# Patient Record
Sex: Male | Born: 1955
Health system: Southern US, Community
[De-identification: ages and names within clinical notes are randomized; demographics above are authoritative.]

## PROBLEM LIST (undated history)

## (undated) DIAGNOSIS — I209 Angina pectoris, unspecified: Secondary | ICD-10-CM

## (undated) DIAGNOSIS — G8929 Other chronic pain: Secondary | ICD-10-CM

## (undated) DIAGNOSIS — E669 Obesity, unspecified: Secondary | ICD-10-CM

## (undated) DIAGNOSIS — F1910 Other psychoactive substance abuse, uncomplicated: Secondary | ICD-10-CM

## (undated) DIAGNOSIS — T7840XA Allergy, unspecified, initial encounter: Secondary | ICD-10-CM

## (undated) DIAGNOSIS — M549 Dorsalgia, unspecified: Secondary | ICD-10-CM

## (undated) DIAGNOSIS — I10 Essential (primary) hypertension: Secondary | ICD-10-CM

## (undated) DIAGNOSIS — I2510 Atherosclerotic heart disease of native coronary artery without angina pectoris: Secondary | ICD-10-CM

## (undated) DIAGNOSIS — E785 Hyperlipidemia, unspecified: Secondary | ICD-10-CM

## (undated) DIAGNOSIS — K219 Gastro-esophageal reflux disease without esophagitis: Secondary | ICD-10-CM

## (undated) HISTORY — DX: Atherosclerotic heart disease of native coronary artery without angina pectoris: I25.10

## (undated) HISTORY — DX: Obesity, unspecified: E66.9

## (undated) HISTORY — DX: Angina pectoris, unspecified: I20.9

## (undated) HISTORY — DX: Essential (primary) hypertension: I10

## (undated) HISTORY — DX: Other chronic pain: G89.29

## (undated) HISTORY — DX: Other psychoactive substance abuse, uncomplicated: F19.10

## (undated) HISTORY — DX: Dorsalgia, unspecified: M54.9

## (undated) HISTORY — PX: BACK SURGERY: SHX140

## (undated) HISTORY — DX: Allergy, unspecified, initial encounter: T78.40XA

## (undated) HISTORY — DX: Gastro-esophageal reflux disease without esophagitis: K21.9

## (undated) HISTORY — DX: Hyperlipidemia, unspecified: E78.5

---

## 1995-09-17 HISTORY — PX: ESOPHAGEAL DILATION: SHX303

## 1998-02-01 ENCOUNTER — Encounter: Admission: RE | Admit: 1998-02-01 | Discharge: 1998-02-01 | Payer: Self-pay | Admitting: Family Medicine

## 1998-06-07 ENCOUNTER — Encounter: Admission: RE | Admit: 1998-06-07 | Discharge: 1998-06-07 | Payer: Self-pay | Admitting: Family Medicine

## 1998-10-17 ENCOUNTER — Encounter: Admission: RE | Admit: 1998-10-17 | Discharge: 1998-10-17 | Payer: Self-pay | Admitting: Family Medicine

## 1999-03-27 ENCOUNTER — Encounter: Admission: RE | Admit: 1999-03-27 | Discharge: 1999-03-27 | Payer: Self-pay | Admitting: Sports Medicine

## 1999-05-30 ENCOUNTER — Encounter: Admission: RE | Admit: 1999-05-30 | Discharge: 1999-05-30 | Payer: Self-pay | Admitting: Family Medicine

## 1999-09-06 ENCOUNTER — Encounter: Admission: RE | Admit: 1999-09-06 | Discharge: 1999-09-06 | Payer: Self-pay | Admitting: Sports Medicine

## 1999-09-24 ENCOUNTER — Encounter: Admission: RE | Admit: 1999-09-24 | Discharge: 1999-09-24 | Payer: Self-pay | Admitting: Family Medicine

## 1999-12-03 ENCOUNTER — Encounter: Admission: RE | Admit: 1999-12-03 | Discharge: 1999-12-03 | Payer: Self-pay | Admitting: Sports Medicine

## 1999-12-06 ENCOUNTER — Encounter: Admission: RE | Admit: 1999-12-06 | Discharge: 1999-12-06 | Payer: Self-pay | Admitting: Family Medicine

## 2000-01-07 ENCOUNTER — Encounter: Payer: Self-pay | Admitting: Gastroenterology

## 2000-01-07 ENCOUNTER — Ambulatory Visit (HOSPITAL_COMMUNITY): Admission: RE | Admit: 2000-01-07 | Discharge: 2000-01-07 | Payer: Self-pay | Admitting: Gastroenterology

## 2000-01-10 ENCOUNTER — Ambulatory Visit (HOSPITAL_COMMUNITY): Admission: RE | Admit: 2000-01-10 | Discharge: 2000-01-10 | Payer: Self-pay | Admitting: Family Medicine

## 2000-04-28 ENCOUNTER — Encounter: Admission: RE | Admit: 2000-04-28 | Discharge: 2000-04-28 | Payer: Self-pay | Admitting: Family Medicine

## 2000-06-30 ENCOUNTER — Encounter: Admission: RE | Admit: 2000-06-30 | Discharge: 2000-06-30 | Payer: Self-pay | Admitting: Family Medicine

## 2000-07-03 ENCOUNTER — Encounter: Admission: RE | Admit: 2000-07-03 | Discharge: 2000-07-03 | Payer: Self-pay | Admitting: Family Medicine

## 2000-07-18 ENCOUNTER — Encounter: Admission: RE | Admit: 2000-07-18 | Discharge: 2000-07-18 | Payer: Self-pay | Admitting: Family Medicine

## 2001-04-20 ENCOUNTER — Ambulatory Visit (HOSPITAL_COMMUNITY): Admission: RE | Admit: 2001-04-20 | Discharge: 2001-04-20 | Payer: Self-pay | Admitting: Family Medicine

## 2001-04-20 ENCOUNTER — Encounter: Admission: RE | Admit: 2001-04-20 | Discharge: 2001-04-20 | Payer: Self-pay | Admitting: Family Medicine

## 2001-05-13 ENCOUNTER — Encounter: Admission: RE | Admit: 2001-05-13 | Discharge: 2001-05-13 | Payer: Self-pay | Admitting: Family Medicine

## 2001-06-05 ENCOUNTER — Encounter: Admission: RE | Admit: 2001-06-05 | Discharge: 2001-06-05 | Payer: Self-pay | Admitting: Family Medicine

## 2001-09-18 ENCOUNTER — Encounter: Admission: RE | Admit: 2001-09-18 | Discharge: 2001-09-18 | Payer: Self-pay | Admitting: Family Medicine

## 2002-01-20 ENCOUNTER — Encounter: Admission: RE | Admit: 2002-01-20 | Discharge: 2002-01-20 | Payer: Self-pay | Admitting: Family Medicine

## 2002-01-25 ENCOUNTER — Encounter: Admission: RE | Admit: 2002-01-25 | Discharge: 2002-01-25 | Payer: Self-pay | Admitting: Family Medicine

## 2002-02-05 ENCOUNTER — Encounter: Admission: RE | Admit: 2002-02-05 | Discharge: 2002-02-05 | Payer: Self-pay | Admitting: Family Medicine

## 2002-02-16 ENCOUNTER — Ambulatory Visit (HOSPITAL_BASED_OUTPATIENT_CLINIC_OR_DEPARTMENT_OTHER): Admission: RE | Admit: 2002-02-16 | Discharge: 2002-02-16 | Payer: Self-pay | Admitting: General Surgery

## 2002-02-16 ENCOUNTER — Encounter (INDEPENDENT_AMBULATORY_CARE_PROVIDER_SITE_OTHER): Payer: Self-pay | Admitting: Specialist

## 2002-06-08 ENCOUNTER — Encounter: Admission: RE | Admit: 2002-06-08 | Discharge: 2002-06-08 | Payer: Self-pay | Admitting: Family Medicine

## 2002-10-18 ENCOUNTER — Encounter: Admission: RE | Admit: 2002-10-18 | Discharge: 2002-10-18 | Payer: Self-pay | Admitting: Family Medicine

## 2003-01-06 ENCOUNTER — Encounter: Admission: RE | Admit: 2003-01-06 | Discharge: 2003-01-06 | Payer: Self-pay | Admitting: Family Medicine

## 2003-07-05 ENCOUNTER — Encounter: Admission: RE | Admit: 2003-07-05 | Discharge: 2003-07-05 | Payer: Self-pay | Admitting: Family Medicine

## 2003-09-17 HISTORY — PX: CARDIOVASCULAR STRESS TEST: SHX262

## 2003-11-04 ENCOUNTER — Encounter: Admission: RE | Admit: 2003-11-04 | Discharge: 2003-11-04 | Payer: Self-pay | Admitting: Family Medicine

## 2004-02-15 ENCOUNTER — Ambulatory Visit (HOSPITAL_COMMUNITY): Admission: RE | Admit: 2004-02-15 | Discharge: 2004-02-15 | Payer: Self-pay | Admitting: Family Medicine

## 2004-02-15 ENCOUNTER — Encounter: Admission: RE | Admit: 2004-02-15 | Discharge: 2004-02-15 | Payer: Self-pay | Admitting: Family Medicine

## 2004-02-23 ENCOUNTER — Inpatient Hospital Stay (HOSPITAL_COMMUNITY): Admission: EM | Admit: 2004-02-23 | Discharge: 2004-02-24 | Payer: Self-pay | Admitting: Emergency Medicine

## 2004-03-16 ENCOUNTER — Encounter: Admission: RE | Admit: 2004-03-16 | Discharge: 2004-03-16 | Payer: Self-pay | Admitting: Family Medicine

## 2004-03-26 ENCOUNTER — Encounter: Admission: RE | Admit: 2004-03-26 | Discharge: 2004-03-26 | Payer: Self-pay | Admitting: Family Medicine

## 2004-04-23 ENCOUNTER — Encounter: Admission: RE | Admit: 2004-04-23 | Discharge: 2004-04-23 | Payer: Self-pay | Admitting: Family Medicine

## 2004-08-14 ENCOUNTER — Ambulatory Visit: Payer: Self-pay | Admitting: Sports Medicine

## 2004-09-16 DIAGNOSIS — I209 Angina pectoris, unspecified: Secondary | ICD-10-CM

## 2004-09-16 HISTORY — DX: Angina pectoris, unspecified: I20.9

## 2004-11-21 ENCOUNTER — Ambulatory Visit: Payer: Self-pay | Admitting: Family Medicine

## 2005-04-15 ENCOUNTER — Ambulatory Visit: Payer: Self-pay | Admitting: Family Medicine

## 2005-04-16 DIAGNOSIS — I2510 Atherosclerotic heart disease of native coronary artery without angina pectoris: Secondary | ICD-10-CM

## 2005-04-16 HISTORY — DX: Atherosclerotic heart disease of native coronary artery without angina pectoris: I25.10

## 2005-05-09 ENCOUNTER — Ambulatory Visit (HOSPITAL_COMMUNITY): Admission: RE | Admit: 2005-05-09 | Discharge: 2005-05-09 | Payer: Self-pay | Admitting: Interventional Cardiology

## 2005-08-06 ENCOUNTER — Ambulatory Visit: Payer: Self-pay | Admitting: Family Medicine

## 2005-12-10 ENCOUNTER — Ambulatory Visit: Payer: Self-pay | Admitting: Family Medicine

## 2006-03-14 ENCOUNTER — Ambulatory Visit: Payer: Self-pay | Admitting: Family Medicine

## 2006-10-24 ENCOUNTER — Ambulatory Visit: Payer: Self-pay | Admitting: Family Medicine

## 2006-11-13 DIAGNOSIS — I2510 Atherosclerotic heart disease of native coronary artery without angina pectoris: Secondary | ICD-10-CM | POA: Insufficient documentation

## 2006-11-13 DIAGNOSIS — I25118 Atherosclerotic heart disease of native coronary artery with other forms of angina pectoris: Secondary | ICD-10-CM | POA: Insufficient documentation

## 2006-11-13 DIAGNOSIS — F1010 Alcohol abuse, uncomplicated: Secondary | ICD-10-CM | POA: Insufficient documentation

## 2006-11-13 DIAGNOSIS — K219 Gastro-esophageal reflux disease without esophagitis: Secondary | ICD-10-CM | POA: Insufficient documentation

## 2006-11-13 DIAGNOSIS — F411 Generalized anxiety disorder: Secondary | ICD-10-CM

## 2006-12-09 ENCOUNTER — Telehealth: Payer: Self-pay | Admitting: *Deleted

## 2006-12-30 ENCOUNTER — Encounter: Payer: Self-pay | Admitting: *Deleted

## 2006-12-30 ENCOUNTER — Ambulatory Visit: Payer: Self-pay | Admitting: Family Medicine

## 2007-01-20 ENCOUNTER — Encounter: Payer: Self-pay | Admitting: Family Medicine

## 2007-02-26 ENCOUNTER — Encounter: Payer: Self-pay | Admitting: Family Medicine

## 2007-04-20 ENCOUNTER — Telehealth: Payer: Self-pay | Admitting: *Deleted

## 2007-04-21 ENCOUNTER — Ambulatory Visit: Payer: Self-pay | Admitting: Family Medicine

## 2007-04-23 ENCOUNTER — Ambulatory Visit: Payer: Self-pay | Admitting: Family Medicine

## 2007-05-12 ENCOUNTER — Telehealth: Payer: Self-pay | Admitting: *Deleted

## 2007-06-04 ENCOUNTER — Encounter: Payer: Self-pay | Admitting: Family Medicine

## 2007-07-10 ENCOUNTER — Telehealth (INDEPENDENT_AMBULATORY_CARE_PROVIDER_SITE_OTHER): Payer: Self-pay | Admitting: *Deleted

## 2007-07-15 ENCOUNTER — Ambulatory Visit: Payer: Self-pay | Admitting: Family Medicine

## 2007-07-15 DIAGNOSIS — E781 Pure hyperglyceridemia: Secondary | ICD-10-CM | POA: Insufficient documentation

## 2007-07-15 DIAGNOSIS — E785 Hyperlipidemia, unspecified: Secondary | ICD-10-CM

## 2007-07-23 ENCOUNTER — Ambulatory Visit: Payer: Self-pay | Admitting: Family Medicine

## 2007-07-23 ENCOUNTER — Encounter (INDEPENDENT_AMBULATORY_CARE_PROVIDER_SITE_OTHER): Payer: Self-pay | Admitting: Family Medicine

## 2007-07-23 LAB — CONVERTED CEMR LAB
ALT: 30 units/L (ref 0–53)
AST: 37 units/L (ref 0–37)
Albumin: 4.6 g/dL (ref 3.5–5.2)
BUN: 14 mg/dL (ref 6–23)
CO2: 24 meq/L (ref 19–32)
Calcium: 9.8 mg/dL (ref 8.4–10.5)
Chloride: 104 meq/L (ref 96–112)
Cholesterol: 167 mg/dL (ref 0–200)
Creatinine, Ser: 1.15 mg/dL (ref 0.40–1.50)
HCT: 44.2 % (ref 39.0–52.0)
HDL: 64 mg/dL (ref >39)
Hemoglobin: 14.3 g/dL (ref 13.0–17.0)
PSA: 0.42 ng/mL (ref 0.10–4.00)
Potassium: 5 meq/L (ref 3.5–5.3)
RBC: 4.16 M/uL — ABNORMAL LOW (ref 4.22–5.81)
RDW: 13.4 % (ref 11.5–14.0)
Total CHOL/HDL Ratio: 2.6
WBC: 3.4 10*3/uL — ABNORMAL LOW (ref 4.0–10.5)

## 2007-07-27 ENCOUNTER — Encounter (INDEPENDENT_AMBULATORY_CARE_PROVIDER_SITE_OTHER): Payer: Self-pay | Admitting: Family Medicine

## 2007-11-28 ENCOUNTER — Telehealth (INDEPENDENT_AMBULATORY_CARE_PROVIDER_SITE_OTHER): Payer: Self-pay | Admitting: *Deleted

## 2007-12-01 ENCOUNTER — Telehealth (INDEPENDENT_AMBULATORY_CARE_PROVIDER_SITE_OTHER): Payer: Self-pay | Admitting: *Deleted

## 2008-01-04 ENCOUNTER — Encounter (INDEPENDENT_AMBULATORY_CARE_PROVIDER_SITE_OTHER): Payer: Self-pay | Admitting: Family Medicine

## 2008-01-04 ENCOUNTER — Ambulatory Visit: Payer: Self-pay | Admitting: Sports Medicine

## 2008-01-04 DIAGNOSIS — M549 Dorsalgia, unspecified: Secondary | ICD-10-CM

## 2008-01-04 DIAGNOSIS — I10 Essential (primary) hypertension: Secondary | ICD-10-CM

## 2008-01-04 LAB — CONVERTED CEMR LAB
BUN: 13 mg/dL (ref 6–23)
CO2: 24 meq/L (ref 19–32)
Calcium: 9.4 mg/dL (ref 8.4–10.5)
Chloride: 102 meq/L (ref 96–112)
Creatinine, Ser: 1.01 mg/dL (ref 0.40–1.50)
HCT: 43.5 % (ref 39.0–52.0)
Hemoglobin: 14.3 g/dL (ref 13.0–17.0)
MCV: 104.6 fL — ABNORMAL HIGH (ref 78.0–100.0)
Testosterone: 418.43 ng/dL (ref 350–890)
Total Bilirubin: 0.5 mg/dL (ref 0.3–1.2)
WBC: 3.7 10*3/uL — ABNORMAL LOW (ref 4.0–10.5)

## 2008-01-07 ENCOUNTER — Encounter (INDEPENDENT_AMBULATORY_CARE_PROVIDER_SITE_OTHER): Payer: Self-pay | Admitting: Family Medicine

## 2008-02-29 ENCOUNTER — Ambulatory Visit: Payer: Self-pay | Admitting: Family Medicine

## 2008-03-14 ENCOUNTER — Telehealth: Payer: Self-pay | Admitting: *Deleted

## 2008-05-09 ENCOUNTER — Ambulatory Visit: Payer: Self-pay | Admitting: Sports Medicine

## 2008-06-07 ENCOUNTER — Encounter: Payer: Self-pay | Admitting: Family Medicine

## 2008-07-04 ENCOUNTER — Telehealth (INDEPENDENT_AMBULATORY_CARE_PROVIDER_SITE_OTHER): Payer: Self-pay | Admitting: *Deleted

## 2008-07-12 ENCOUNTER — Encounter: Payer: Self-pay | Admitting: Family Medicine

## 2008-07-20 ENCOUNTER — Telehealth: Payer: Self-pay | Admitting: *Deleted

## 2008-07-20 ENCOUNTER — Encounter: Payer: Self-pay | Admitting: *Deleted

## 2008-08-04 ENCOUNTER — Telehealth: Payer: Self-pay | Admitting: *Deleted

## 2008-08-23 ENCOUNTER — Encounter: Payer: Self-pay | Admitting: Family Medicine

## 2008-08-23 ENCOUNTER — Ambulatory Visit: Payer: Self-pay | Admitting: Family Medicine

## 2008-08-23 LAB — CONVERTED CEMR LAB
Cholesterol: 151 mg/dL (ref 0–200)
HDL: 58 mg/dL (ref >39)
Total CHOL/HDL Ratio: 2.6
Triglycerides: 290 mg/dL — ABNORMAL HIGH (ref <150)

## 2008-08-25 ENCOUNTER — Encounter: Payer: Self-pay | Admitting: Family Medicine

## 2008-08-25 ENCOUNTER — Encounter: Admission: RE | Admit: 2008-08-25 | Discharge: 2008-08-25 | Payer: Self-pay | Admitting: Family Medicine

## 2009-02-14 ENCOUNTER — Ambulatory Visit: Payer: Self-pay | Admitting: Family Medicine

## 2009-08-18 ENCOUNTER — Telehealth (INDEPENDENT_AMBULATORY_CARE_PROVIDER_SITE_OTHER): Payer: Self-pay | Admitting: *Deleted

## 2009-08-29 ENCOUNTER — Ambulatory Visit: Payer: Self-pay | Admitting: Family Medicine

## 2009-08-29 ENCOUNTER — Encounter: Payer: Self-pay | Admitting: Family Medicine

## 2009-08-29 ENCOUNTER — Ambulatory Visit (HOSPITAL_COMMUNITY): Admission: RE | Admit: 2009-08-29 | Discharge: 2009-08-29 | Payer: Self-pay | Admitting: Family Medicine

## 2009-08-31 LAB — CONVERTED CEMR LAB
ALT: 35 units/L (ref 0–53)
BUN: 15 mg/dL (ref 6–23)
CO2: 21 meq/L (ref 19–32)
Calcium: 9.4 mg/dL (ref 8.4–10.5)
Chloride: 103 meq/L (ref 96–112)
Cholesterol: 155 mg/dL (ref 0–200)
Creatinine, Ser: 0.88 mg/dL (ref 0.40–1.50)
Glucose, Bld: 93 mg/dL (ref 70–99)
HDL: 51 mg/dL (ref >39)
Total Bilirubin: 0.5 mg/dL (ref 0.3–1.2)
Total CHOL/HDL Ratio: 3
Triglycerides: 276 mg/dL — ABNORMAL HIGH (ref <150)

## 2009-09-05 ENCOUNTER — Encounter: Payer: Self-pay | Admitting: Family Medicine

## 2009-09-14 ENCOUNTER — Telehealth: Payer: Self-pay | Admitting: *Deleted

## 2009-09-16 HISTORY — PX: COLONOSCOPY W/ BIOPSIES: SHX1374

## 2009-09-18 ENCOUNTER — Ambulatory Visit: Payer: Self-pay | Admitting: Family Medicine

## 2009-09-18 DIAGNOSIS — E663 Overweight: Secondary | ICD-10-CM | POA: Insufficient documentation

## 2009-11-09 ENCOUNTER — Telehealth: Payer: Self-pay | Admitting: Family Medicine

## 2009-11-10 ENCOUNTER — Telehealth: Payer: Self-pay | Admitting: Family Medicine

## 2009-11-13 ENCOUNTER — Encounter (INDEPENDENT_AMBULATORY_CARE_PROVIDER_SITE_OTHER): Payer: Self-pay | Admitting: *Deleted

## 2009-11-27 ENCOUNTER — Encounter: Payer: Self-pay | Admitting: Family Medicine

## 2009-11-27 ENCOUNTER — Ambulatory Visit: Payer: Self-pay | Admitting: Family Medicine

## 2009-11-27 DIAGNOSIS — J309 Allergic rhinitis, unspecified: Secondary | ICD-10-CM

## 2009-12-15 ENCOUNTER — Encounter: Payer: Self-pay | Admitting: Family Medicine

## 2010-02-21 ENCOUNTER — Telehealth: Payer: Self-pay | Admitting: Family Medicine

## 2010-03-20 ENCOUNTER — Telehealth (INDEPENDENT_AMBULATORY_CARE_PROVIDER_SITE_OTHER): Payer: Self-pay | Admitting: *Deleted

## 2010-03-27 ENCOUNTER — Encounter: Payer: Self-pay | Admitting: *Deleted

## 2010-03-27 ENCOUNTER — Encounter: Payer: Self-pay | Admitting: Family Medicine

## 2010-03-27 ENCOUNTER — Ambulatory Visit: Payer: Self-pay | Admitting: Family Medicine

## 2010-03-27 LAB — CONVERTED CEMR LAB
ALT: 37 units/L (ref 0–53)
Alkaline Phosphatase: 58 units/L (ref 39–117)
CO2: 22 meq/L (ref 19–32)
Creatinine, Ser: 0.92 mg/dL (ref 0.40–1.50)
HCT: 42.1 % (ref 39.0–52.0)
MCHC: 33 g/dL (ref 30.0–36.0)
MCV: 107.9 fL — ABNORMAL HIGH (ref 78.0–100.0)
Platelets: 137 10*3/uL — ABNORMAL LOW (ref 150–400)
Total Bilirubin: 0.5 mg/dL (ref 0.3–1.2)
WBC: 3.8 10*3/uL — ABNORMAL LOW (ref 4.0–10.5)

## 2010-05-23 ENCOUNTER — Telehealth: Payer: Self-pay | Admitting: Family Medicine

## 2010-06-18 ENCOUNTER — Telehealth: Payer: Self-pay | Admitting: *Deleted

## 2010-07-25 ENCOUNTER — Telehealth: Payer: Self-pay | Admitting: Family Medicine

## 2010-08-31 ENCOUNTER — Telehealth: Payer: Self-pay | Admitting: Family Medicine

## 2010-09-26 ENCOUNTER — Telehealth: Payer: Self-pay | Admitting: Family Medicine

## 2010-10-08 ENCOUNTER — Ambulatory Visit: Admission: RE | Admit: 2010-10-08 | Discharge: 2010-10-08 | Payer: Self-pay | Source: Home / Self Care

## 2010-10-08 DIAGNOSIS — M6740 Ganglion, unspecified site: Secondary | ICD-10-CM | POA: Insufficient documentation

## 2010-10-08 DIAGNOSIS — D164 Benign neoplasm of bones of skull and face: Secondary | ICD-10-CM | POA: Insufficient documentation

## 2010-10-08 DIAGNOSIS — R040 Epistaxis: Secondary | ICD-10-CM | POA: Insufficient documentation

## 2010-10-18 NOTE — Assessment & Plan Note (Signed)
Summary: f/u med/tlb   Vital Signs:  Patient profile:   55 year old male Weight:      189 pounds BMI:     27.22 Temp:     97.5 degrees F oral Pulse rate:   55 / minute BP sitting:   132 / 91  (left arm)  Vitals Entered By: Jimmy Footman, CMA (March 27, 2010 8:39 AM) CC: f/u meds Is Patient Diabetic? No Pain Assessment Patient in pain? no        Primary Care Provider:  Renold Don MD  CC:  f/u meds.  History of Present Illness: Chronic back pain: Pain is localized to lower back, sharp, intermittent pain.  History of prior back surgeries as well as diskectomies.  Previously prescribed Tylenol scheduled 6 times a day for pain relief but did not take it as prescribed...typically takes 500mg  two times a day if at all.  States too many pills as prescibed.  He will take a vicodin sporadically for breakthrough pain.  Denies any weakness or paresthesia of lower extremities.    Eye:  States he has occasional eye pain, feels like something is in his eye when he rubs it.  Has had previous cataract surgery about 10 years ago, corneal abrasion with Christmas tree branch several years ago.  Describes pain as dull, intermittent.  Started several weeks ago, persistent but mild.  No redness, pain with eye movement, cellulitis around area.  Blurry vision which is chronic for almost a year.  HTN: No adverse effects from medication.  Not checking it regularly.  Was well controlled at last visit.  No dizziness, HA, CP, palpitations, or swelling.  HLD: Tolerating medication.  No adverse effects.  No muscle pain or abd pain.  Allergies: Controlled on Fexofenadine, states Zyrtec does not work for him.  If does not take his medication, c/o watery, itchy eyes and rhinorrhea.  GERD: Well controlled on omeprazole.      Habits & Providers  Alcohol-Tobacco-Diet     Tobacco Status: quit  Current Problems (verified): 1)  Allergic Rhinitis  (ICD-477.9) 2)  Overweight  (ICD-278.02) 3)  Essential  Hypertension, Benign  (ICD-401.1) 4)  Back Pain  (ICD-724.5) 5)  Hyperlipidemia Nec/nos  (ICD-272.4) 6)  Gastroesophageal Reflux, No Esophagitis  (ICD-530.81) 7)  Coronary, Arteriosclerosis  (ICD-414.00) 8)  Anxiety  (ICD-300.00) 9)  Alcohol Abuse, Unspecified  (ICD-305.00)  Current Medications (verified): 1)  Amitriptyline Hcl 50 Mg Tabs (Amitriptyline Hcl) .... Take 1 Tablet By Mouth At Bedtime 2)  Aspirin Ec 81 Mg Tbec (Aspirin) .... Take 1 Tablet By Mouth Once A Day 3)  Buspirone Hcl 30 Mg Tabs (Buspirone Hcl) .... Take 1 Tablet By Mouth Twice A Day 4)  Allegra 60 Mg Tabs (Fexofenadine Hcl) .Marland Kitchen.. 1 Tab By Mouth Daily As Needed For Allergies 5)  Imdur 60 Mg Tb24 (Isosorbide Mononitrate) .... Take 1 Tablet By Mouth Once A Day 6)  Omeprazole 20 Mg Cpdr (Omeprazole) .... One Tab By Mouth Qday 7)  Vytorin 10-20 Mg Tabs (Ezetimibe-Simvastatin) .... Take 1 Tablet By Mouth Once A Day 8)  Metoprolol Tartrate 100 Mg Tabs (Metoprolol Tartrate) .... One Tab By Mouth Bid 9)  Nitrostat 0.4 Mg Subl (Nitroglycerin) .Marland Kitchen.. 1 Tab Sl Every 5 Minutes Up To 3 Doses As Needed For Severe Chest Pain Then Call 911 10)  Flonase 50 Mcg/act Susp (Fluticasone Propionate) .Marland Kitchen.. 1 Spray Each Nostril Daily  Allergies (verified): No Known Drug Allergies  Past History:  Past medical, surgical, family and social  histories (including risk factors) reviewed, and no changes noted (except as noted below).  Past Medical History: Reviewed history from 11/27/2009 and no changes required. HTN Hyperlipidemia CAD - Cards Dr. Katrinka Blazing Allergic Rhinitis ETOH abuse Chronic back pain s/p surgery Dr. Opal Sidles 6/92, Dr. Herby Abraham in 1/89 Esophageal rings s/p dilatation 1997, 6/01 pilonidal cyst s/p I&D 10/01--referred for removal 5/03 Polypectomy, 2 benigh polyps 5/03 colonoscopy 7/07 needs repeat in 7/10 ([facility])  Past Surgical History: Back surgery 1989, 1992 Esophageal dilatation for rings '97, 2001 Cardiac Cath - diffuse  artery spasm, ramus intermedius and diagnoal disease - 05/23/2005 cataract surgery 2000  ETT 04/01 McDiarmid, Normal - 01/10/2000  Family History: Reviewed history from 11/13/2006 and no changes required. Father has had MI age 9, PVD., Mother w/CABG in 65s, HTN, DM and high cholesterol., Sister had MI at age 72.  Social History: Reviewed history from 07/15/2007 and no changes required. 92-year pack-hx.  Stopped.  Drinks about 5-6 beers/day.  Used to drink 12 a day including liquor. Unemployed.  Disabled from back injury. Lives with his wife.Smoking Status:  quit  Review of Systems       ROS:  no headaches, pre-syncopal or syncopal episodes, chest pain, palpitations, shortness of breath or dyspnea, abdominal pain, diarrhea or constipation, melena, hematochezia, lower extremity swelling.    Physical Exam  General:  Vital signs reviewed Well-developed, well-nourished patient in NAD.  Awake, cooperative.  Eyes:  no conjuctival redness noted.  EOMI, no pain with eye movement.  Somewhat decreased visual acuity. pupils equal, pupils round, and pupils reactive to light.  No pain on palpation of eye through closed eyelid, eye does not feel swollen or increased pressure Mouth:  oral mucosa moist Lungs:  Normal respiratory effort, chest expands symmetrically. Lungs are clear to auscultation, no crackles or wheezes. Heart:  Normal rate and regular rhythm. S1 and S2 normal without gallop, murmur, click, rub or other extra sounds. Abdomen:  soft/nontender/nondistended.  No organomegaly, bowel sounds present.  Msk:  Inspection- no obvious deformities ROM- Limited flexion (more flexibility issue than pain), nl extension, rotation, and lateral flexion Palpation- nontender along spinal column, no mass or deformity Extremities:  no lower extremity edema noted,  Neurologic:  No focal deficits   Impression & Recommendations:  Problem # 1:  BACK PAIN (ICD-724.5) Assessment Unchanged Patient continues  to c/o lower back pain.  Description as per HPI.  Previously attempted regularly scheduled Tylenol but he would rather take as needed medication instead of scheduled, states regularly scheduled Tylenol is too many pills.  History of alcoholism as well as ongoing EtOH abuse would make me hesitant to prescribe too much Tylenol.  Has attempted Ultram in past without relief, does not take currently.  States that Vicodin has worked in past, does not use everyday, states that 60 pills last him about 4-5 months.  Discussed this with him as well as physical therapy, which he states he's tried before.  Will prescribe Vicodin for now, if he starts needing increased doseing may need to switch to long-acting medication.  Does not take Flexeril, will remove from med list.   The following medications were removed from the medication list:    Ultram 50 Mg Tabs (Tramadol hcl) .Marland Kitchen... 1 tab by mouth every 8 hours as needed for breakthrough pain    Tylenol Extra Strength 500 Mg Tabs (Acetaminophen) .Marland Kitchen... 2 tabs by mouth every 8 hours daily    Flexeril 10 Mg Tabs (Cyclobenzaprine hcl) .Marland Kitchen... 1 tab by mouth at  bedtime as needed for muscle spasms His updated medication list for this problem includes:    Aspirin Ec 81 Mg Tbec (Aspirin) .Marland Kitchen... Take 1 tablet by mouth once a day    Hydrocodone-acetaminophen 5-325 Mg Tabs (Hydrocodone-acetaminophen) .Marland Kitchen... Take 1 tab every 8 hours as needed for pain  Orders: [facility]- Est Level  3 (16109)  Problem # 2:  BLURRED VISION (ICD-368.8)  In history of patient with previous cataract surgery, plan to refer to ophthamology.  No signs or symptoms of increased ocular pressure at this visit, no conjunctivitis or signs of infection.  Plan to refer sooner rather than wait.  Discussed this with patient who agrees with plan.  Gave explicit red flag symptoms and reasons to return to clinic immediately or go to ED if he has any signs of increasing ocular pressure.     Orders: Ophthalmology Referral  (Ophthalmology)  Problem # 3:  ALLERGIC RHINITIS (ICD-477.9) Assessment: Unchanged Continue Flonase and Fexofenadine.   His updated medication list for this problem includes:    Allegra 60 Mg Tabs (Fexofenadine hcl) .Marland Kitchen... 1 tab by mouth daily as needed for allergies    Flonase 50 Mcg/act Susp (Fluticasone propionate) .Marland Kitchen... 1 spray each nostril daily  Problem # 4:  ESSENTIAL HYPERTENSION, BENIGN (ICD-401.1) Assessment: Improved Tolerating medicine well.  Vital signs good today.  Continue same dose of Beta blocker.   His updated medication list for this problem includes:    Metoprolol Tartrate 100 Mg Tabs (Metoprolol tartrate) ..... One tab by mouth bid  Orders: Comp Met-[facility] (60454-09811) CBC-[facility] (91478) [facility]- Est Level  3 (29562)  Complete Medication List: 1)  Amitriptyline Hcl 50 Mg Tabs (Amitriptyline hcl) .... Take 1 tablet by mouth at bedtime 2)  Aspirin Ec 81 Mg Tbec (Aspirin) .... Take 1 tablet by mouth once a day 3)  Buspirone Hcl 30 Mg Tabs (Buspirone hcl) .... Take 1 tablet by mouth twice a day 4)  Allegra 60 Mg Tabs (Fexofenadine hcl) .Marland Kitchen.. 1 tab by mouth daily as needed for allergies 5)  Imdur 60 Mg Tb24 (Isosorbide mononitrate) .... Take 1 tablet by mouth once a day 6)  Omeprazole 20 Mg Cpdr (Omeprazole) .... One tab by mouth qday 7)  Vytorin 10-20 Mg Tabs (Ezetimibe-simvastatin) .... Take 1 tablet by mouth once a day 8)  Metoprolol Tartrate 100 Mg Tabs (Metoprolol tartrate) .... One tab by mouth bid 9)  Nitrostat 0.4 Mg Subl (Nitroglycerin) .Marland Kitchen.. 1 tab sl every 5 minutes up to 3 doses as needed for severe chest pain then call 911 10)  Flonase 50 Mcg/act Susp (Fluticasone propionate) .Marland Kitchen.. 1 spray each nostril daily 11)  Hydrocodone-acetaminophen 5-325 Mg Tabs (Hydrocodone-acetaminophen) .... Take 1 tab every 8 hours as needed for pain Prescriptions: HYDROCODONE-ACETAMINOPHEN 5-325 MG TABS (HYDROCODONE-ACETAMINOPHEN) Take 1 tab every 8 hours as needed for pain  #60 x 0    Entered and Authorized by:   Renold Don MD   Signed by:   Renold Don MD on 03/27/2010   Method used:   Print then Give to Patient   RxID:   [id number] HYDROCODONE-ACETAMINOPHEN 5-325 MG TABS (HYDROCODONE-ACETAMINOPHEN) Take 1 tab every 8 hours as needed for pain  #60 x 0   Entered and Authorized by:   Renold Don MD   Signed by:   Renold Don MD on 03/27/2010   Method used:   Print then Give to Patient   RxID:   [id number]

## 2010-10-18 NOTE — Letter (Signed)
Summary: Generic Letter  [facility]  [address]   [place], [place] [address]   Phone: [phone]  Fax: [phone]    11/13/2009  KONNAR BEN [address] [place], [place]  [address]  Dear Mr. Flannagan,    The Vicodan is not a pain medication for chronic use and Dr Burnadette Pop  will not be able to refill that at this time.  Dr Burnadette Pop would be happy to see you in the office for further evaluation. Please call the office at [phone] to schedule an appointment.        Sincerely,   Gladstone Pih

## 2010-10-18 NOTE — Progress Notes (Signed)
Summary: phn msg  Phone Note Call from Patient   Caller: Patient Summary of Call: Pt called concerning pain meds and was explained that a f/up appointment was needed before medication can be given.  Pt refused to make appointment stating he was told all he had to do was call and he could get his medication without being seen for 6 months.  Also said that if he did not get his pain meds by Monday that he would be over here at office by 9:00 and he would get his pain meds. Initial call taken by: Clydell Hakim,  November 10, 2009 2:28 PM  Follow-up for Phone Call        I will be happy to see the patient to evaluate and discuss need for further pain medication Follow-up by: Marisue Ivan  MD,  November 12, 2009 10:55 PM

## 2010-10-18 NOTE — Progress Notes (Signed)
Summary: Rx Req  Phone Note Refill Request Call back at Home Phone [phone] Message from:  Patient  Refills Requested: Medication #1:  IMDUR 60 MG TB24 Take 1 tablet by mouth once a day [address]  Initial call taken by: Clydell Hakim,  May 23, 2010 2:29 PM    Prescriptions: IMDUR 60 MG TB24 (ISOSORBIDE MONONITRATE) Take 1 tablet by mouth once a day  #90 Tablet x 0   Entered and Authorized by:   Antoine Primas DO   Signed by:   Antoine Primas DO on 05/23/2010   Method used:   Electronically to        [id number] (retail)       [address]       [place], [place]  [address]       Ph: [phone] or [phone]       Fax: [phone]   RxID:   [id number]

## 2010-10-18 NOTE — Assessment & Plan Note (Signed)
Summary: f/u chronic issues   Vital Signs:  Patient profile:   55 year old male Height:      70 inches Weight:      197.3 pounds BMI:     28.41 Temp:     98.4 degrees F oral Pulse rate:   63 / minute BP sitting:   124 / 82  (left arm) Cuff size:   regular  Vitals Entered By: Gladstone Pih (November 27, 2009 10:14 AM) CC: F/U med refill Is Patient Diabetic? No Pain Assessment Patient in pain? no        Primary Care Provider:  Marisue Ivan  MD  CC:  F/U med refill.  History of Present Illness: 55yo M here to f/u on med refills  Chronic back pain: Pain is localized to lower back, sharp, intermittent pain.  States that he has not taken the tylenol as prescribed...typically takes 500mg  two times a day.  He will take a vicodin sporadically for breakthrough pain.  Denies any weakness or paresthesia of lower extremities.    HTN: No adverse effects from medication.  Not checking it regularly.  Was well controlled at last visit.  No dizziness, HA, CP, palpitations, or swelling.  HLD: Tolerating medication.  No adverse effects.  No muscle pain or abd pain.  Allergies: Desires to be switched to Zyrtec.  States that Allegra not effective.  c/o watery, itchy eyes and rhinorrhea.  GERD: Well controlled on omeprazole.    Preventative: Scheduled for repeat colonoscopy per Dr. Matthias Hughs on April 1st.  History of benign polpys.  Last colonoscopy was 2007.    Habits & Providers  Alcohol-Tobacco-Diet     Tobacco Status: never  Current Medications (verified): 1)  Amitriptyline Hcl 50 Mg Tabs (Amitriptyline Hcl) .... Take 1 Tablet By Mouth At Bedtime 2)  Aspirin Ec 81 Mg Tbec (Aspirin) .... Take 1 Tablet By Mouth Once A Day 3)  Buspirone Hcl 30 Mg Tabs (Buspirone Hcl) .... Take 1 Tablet By Mouth Twice A Day 4)  Zyrtec Allergy 10 Mg Caps (Cetirizine Hcl) .Marland Kitchen.. 1 Tab By Mouth Daily 5)  Imdur 60 Mg Tb24 (Isosorbide Mononitrate) .... Take 1 Tablet By Mouth Once A Day 6)  Omeprazole 20 Mg  Cpdr (Omeprazole) .... One Tab By Mouth Qday 7)  Ultram 50 Mg Tabs (Tramadol Hcl) .Marland Kitchen.. 1 Tab By Mouth Every 8 Hours As Needed For Breakthrough Pain 8)  Vytorin 10-20 Mg Tabs (Ezetimibe-Simvastatin) .... Take 1 Tablet By Mouth Once A Day 9)  Metoprolol Tartrate 100 Mg Tabs (Metoprolol Tartrate) .... One Tab By Mouth Bid 10)  Nitrostat 0.4 Mg Subl (Nitroglycerin) .Marland Kitchen.. 1 Tab Sl Every 5 Minutes Up To 3 Doses As Needed For Severe Chest Pain Then Call 911 11)  Tylenol Extra Strength 500 Mg Tabs (Acetaminophen) .... 2 Tabs By Mouth Every 8 Hours Daily 12)  Flonase 50 Mcg/act Susp (Fluticasone Propionate) .Marland Kitchen.. 1 Spray Each Nostril Daily 13)  Flexeril 10 Mg Tabs (Cyclobenzaprine Hcl) .Marland Kitchen.. 1 Tab By Mouth At Bedtime As Needed For Muscle Spasms  Allergies (verified): No Known Drug Allergies  Past History:  Past Medical History: HTN Hyperlipidemia CAD - Cards Dr. Katrinka Blazing Allergic Rhinitis ETOH abuse Chronic back pain s/p surgery Dr. Opal Sidles 6/92, Dr. Herby Abraham in 1/89 Esophageal rings s/p dilatation 1997, 6/01 pilonidal cyst s/p I&D 10/01--referred for removal 5/03 Polypectomy, 2 benigh polyps 5/03 colonoscopy 7/07 needs repeat in 7/10 ([facility])  Review of Systems       Denies any weakness or paresthesia  of lower extremities.   No dizziness, HA, CP, palpitations, or swelling.   Physical Exam  General:  VS Reviewed. Obese, well appearing, NAD.  Eyes:  no injected conjunctiva Nose:  no drainage Neck:  supple, full ROM, no goiter or mass  Lungs:  Normal respiratory effort, chest expands symmetrically. Lungs are clear to auscultation, no crackles or wheezes. Heart:  Normal rate and regular rhythm. S1 and S2 normal without gallop, murmur, click, rub or other extra sounds. Msk:  Back exam: Inspection- no obvious deformities ROM- Limited flexion (more flexibility issue than pain), nl extension, rotation, and lateral flexion Palpation- nontender along spinal column, no mass or  deformity   Extremities:  no edema Neurologic:  no focal deficits   Impression & Recommendations:  Problem # 1:  BACK PAIN (ICD-724.5) Assessment Unchanged  Chronic condition s/p 2 surgeries. No signs of medication abuse. Plan to have him increased his Tylenol to 1000mg  three times a day and switch from Vicodin to Ultram given some symptoms that sounds more like neuropathic pain. He did not tolerate the gabapentin in the past. He desires no further w/u or therapy. Pain contract was obtained today.  He is to not receive more than #60 ultram a month...ok for monthly refills if Ultram works for him. Will f/u in June.  The following medications were removed from the medication list:    Flexeril 10 Mg Tabs (Cyclobenzaprine hcl) .Marland Kitchen... Take 1/2 -1 tablet by mouth three times a day His updated medication list for this problem includes:    Aspirin Ec 81 Mg Tbec (Aspirin) .Marland Kitchen... Take 1 tablet by mouth once a day    Ultram 50 Mg Tabs (Tramadol hcl) .Marland Kitchen... 1 tab by mouth every 8 hours as needed for breakthrough pain    Tylenol Extra Strength 500 Mg Tabs (Acetaminophen) .Marland Kitchen... 2 tabs by mouth every 8 hours daily    Flexeril 10 Mg Tabs (Cyclobenzaprine hcl) .Marland Kitchen... 1 tab by mouth at bedtime as needed for muscle spasms  Orders: [facility]- Est  Level 4 (8[address]56)  Problem # 2:  ESSENTIAL HYPERTENSION, BENIGN (ICD-401.1) Assessment: Unchanged  At goal (<140/90) No changes to regimen.    His updated medication list for this problem includes:    Metoprolol Tartrate 100 Mg Tabs (Metoprolol tartrate) ..... One tab by mouth bid  Orders: [facility]- Est  Level 4 ([address]08)  Problem # 3:  GASTROESOPHAGEAL REFLUX, NO ESOPHAGITIS (ICD-530.81) Assessment: Unchanged  Well controlled on current regimen. No changes.  His updated medication list for this problem includes:    Omeprazole 20 Mg Cpdr (Omeprazole) ..... One tab by mouth qday  Orders: [facility]- Est  Level 4 (65784)  Problem # 4:  ALLERGIC RHINITIS  (ICD-477.9) Assessment: Deteriorated  Symptoms not adequately controlled on current allegra. Plan to switch to zyrtec and start flonase.  His updated medication list for this problem includes:    Zyrtec Allergy 10 Mg Caps (Cetirizine hcl) .Marland Kitchen... 1 tab by mouth daily    Flonase 50 Mcg/act Susp (Fluticasone propionate) .Marland Kitchen... 1 spray each nostril daily  Orders: [facility]- Est  Level 4 (69629)  Problem # 5:  HYPERLIPIDEMIA NEC/NOS (ICD-272.4) Assessment: Unchanged  At goal at last check 08/2009. Consider cutting back on the lipid medication but given known hx of CAD, would ideally defer this to Dr. Katrinka Blazing.  His updated medication list for this problem includes:    Vytorin 10-20 Mg Tabs (Ezetimibe-simvastatin) .Marland Kitchen... Take 1 tablet by mouth once a day  Orders: [facility]- Est  Level 4 (  [address]94)  Problem # 6:  Preventive Health Care (ICD-V70.0) Assessment: Comment Only Scheduled for repeat colonoscopy per Dr. Matthias Hughs on April 1st.  History of benign polpys.  Last colonoscopy was 2007.  Complete Medication List: 1)  Amitriptyline Hcl 50 Mg Tabs (Amitriptyline hcl) .... Take 1 tablet by mouth at bedtime 2)  Aspirin Ec 81 Mg Tbec (Aspirin) .... Take 1 tablet by mouth once a day 3)  Buspirone Hcl 30 Mg Tabs (Buspirone hcl) .... Take 1 tablet by mouth twice a day 4)  Zyrtec Allergy 10 Mg Caps (Cetirizine hcl) .Marland Kitchen.. 1 tab by mouth daily 5)  Imdur 60 Mg Tb24 (Isosorbide mononitrate) .... Take 1 tablet by mouth once a day 6)  Omeprazole 20 Mg Cpdr (Omeprazole) .... One tab by mouth qday 7)  Ultram 50 Mg Tabs (Tramadol hcl) .Marland Kitchen.. 1 tab by mouth every 8 hours as needed for breakthrough pain 8)  Vytorin 10-20 Mg Tabs (Ezetimibe-simvastatin) .... Take 1 tablet by mouth once a day 9)  Metoprolol Tartrate 100 Mg Tabs (Metoprolol tartrate) .... One tab by mouth bid 10)  Nitrostat 0.4 Mg Subl (Nitroglycerin) .Marland Kitchen.. 1 tab sl every 5 minutes up to 3 doses as needed for severe chest pain then call 911 11)  Tylenol Extra  Strength 500 Mg Tabs (Acetaminophen) .... 2 tabs by mouth every 8 hours daily 12)  Flonase 50 Mcg/act Susp (Fluticasone propionate) .Marland Kitchen.. 1 spray each nostril daily 13)  Flexeril 10 Mg Tabs (Cyclobenzaprine hcl) .Marland Kitchen.. 1 tab by mouth at bedtime as needed for muscle spasms  Patient Instructions: 1)  Follow up in June 2011. 2)  We switched pain medication to Ultram. 3)  I switched you to Zyrtec and a nasal spray called flonase for your allergies. 4)  Call me if the ultram does not work as well as the vicodin.   Prescriptions: FLEXERIL 10 MG TABS (CYCLOBENZAPRINE HCL) 1 tab by mouth at bedtime as needed for muscle spasms  #30 x 2   Entered and Authorized by:   Marisue Ivan  MD   Signed by:   Marisue Ivan  MD on 11/27/2009   Method used:   Electronically to        [facility]  [address] # [id number]* (retail)       [address]       [place], [place]  [address]       Ph: [phone] or [phone]       Fax: [phone]   RxID:   [id number] ZYRTEC ALLERGY 10 MG CAPS (CETIRIZINE HCL) 1 tab by mouth daily  #90 x 1   Entered and Authorized by:   Marisue Ivan  MD   Signed by:   Marisue Ivan  MD on 11/27/2009   Method used:   Electronically to        [facility]  [address] [id number]* (retail)       [address]       [place], [place]  [address]       Ph: [phone] or [phone]       Fax: [phone]   RxID:   [id number] FLONASE 50 MCG/ACT SUSP (FLUTICASONE PROPIONATE) 1 spray each nostril daily  #1 x 5   Entered and Authorized by:   Marisue Ivan  MD   Signed by:   Marisue Ivan  MD on 11/27/2009   Method used:   Electronically to  [facility]  [address] [id number]* (retail)       [address]       [place], [place]  [address]       Ph: [phone] or [phone]       Fax: [phone]   RxID:   [id number] ULTRAM 50 MG TABS (TRAMADOL HCL) 1 tab by mouth every 8 hours as needed for  breakthrough pain  #60 x 0   Entered and Authorized by:   Marisue Ivan  MD   Signed by:   Marisue Ivan  MD on 11/27/2009   Method used:   Electronically to        [facility]  [address] [id number]* (retail)       [address]       [place], [place]  [address]       Ph: [phone] or [phone]       Fax: [phone]   RxID:   [id number]    Prevention & Chronic Care Immunizations   Influenza vaccine: Not documented   Influenza vaccine deferral: Deferred  (09/18/2009)    Tetanus booster: 10/17/2002: Done.   Tetanus booster due: 10/17/2012    Pneumococcal vaccine: Not documented  Colorectal Screening   Hemoccult: Not documented   Hemoccult due: Not Indicated    Colonoscopy: Done.  (03/16/2006)   Colonoscopy due: 03/16/2016  Other Screening   PSA: 0.43  (08/23/2008)   PSA action/deferral: Discussed-PSA declined  (09/18/2009)   PSA due due: 08/23/2009   Smoking status: never  (11/27/2009)  Lipids   Total Cholesterol: 155  (08/29/2009)   LDL: 49  (08/29/2009)   LDL Direct: Not documented   HDL: 51  (08/29/2009)   Triglycerides: 276  (08/29/2009)    SGOT (AST): 34  (08/29/2009)   SGPT (ALT): 35  (08/29/2009)   Alkaline phosphatase: 62  (08/29/2009)   Total bilirubin: 0.5  (08/29/2009)    Lipid flowsheet reviewed?: Yes   Progress toward LDL goal: At goal  Hypertension   Last Blood Pressure: 124 / 82  (11/27/2009)   Serum creatinine: 0.88  (08/29/2009)   Serum potassium 4.4  (08/29/2009)    Hypertension flowsheet reviewed?: Yes   Progress toward BP goal: At goal  Self-Management Support :   Personal Goals (by the next clinic visit) :      Personal blood pressure goal: 140/90  (08/29/2009)     Personal LDL goal: 100  (08/29/2009)    Patient will work on the following items until the next clinic visit to reach self-care goals:     Medications and monitoring: take my medicines every day, check my blood pressure, bring all of  my medications to every visit  (11/27/2009)     Eating: drink diet soda or water instead of juice or soda, eat more vegetables, use fresh or frozen vegetables, eat foods that are low in salt, eat baked foods instead of fried foods, eat fruit for snacks and desserts, limit or avoid alcohol  (11/27/2009)    Hypertension self-management support: BP self-monitoring log, Written self-care plan, Education handout  (11/27/2009)   Hypertension self-care plan printed.   Hypertension education handout printed    Lipid self-management support: Written self-care plan, Education handout  (09/18/2009)

## 2010-10-18 NOTE — Progress Notes (Signed)
Summary: refill  Phone Note Refill Request Call back at Home Phone [phone] Message from:  Patient  Refills Requested: Medication #1:  AMITRIPTYLINE HCL 50 MG TABS Take 1 tablet by mouth at bedtime Initial call taken by: De Nurse,  September 26, 2010 3:03 PM    Prescriptions: AMITRIPTYLINE HCL 50 MG TABS (AMITRIPTYLINE HCL) Take 1 tablet by mouth at bedtime  #30 Tablet x 4   Entered and Authorized by:   Renold Don MD   Signed by:   Renold Don MD on 09/27/2010   Method used:   Electronically to        [facility]  [address] [id number]* (retail)       [address]       [place], [place]  [address]       Ph: [phone] or [phone]       Fax: [phone]   RxID:   [id number]

## 2010-10-18 NOTE — Progress Notes (Signed)
Summary: Allegra 60mg  qd #30 x2  Phone Note Call from Patient Call back at Home Phone [phone]   Caller: Patient Summary of Call: pt states that insurance has stopped paying for Zyrtex and needs allegra called in to [facility] [address] Initial call taken by: De Nurse,  February 21, 2010 2:27 PM  Follow-up for Phone Call        request completed Follow-up by: Marisue Ivan  MD,  February 22, 2010 4:30 PM    New/Updated Medications: ALLEGRA 60 MG TABS (FEXOFENADINE HCL) 1 tab by mouth daily as needed for allergies Prescriptions: ALLEGRA 60 MG TABS (FEXOFENADINE HCL) 1 tab by mouth daily as needed for allergies  #30 x 2   Entered and Authorized by:   Marisue Ivan  MD   Signed by:   Marisue Ivan  MD on 02/22/2010   Method used:   Electronically to        [facility]  [address] [id number] (retail)       [address]       [place], [place]  [address]       Ph: [phone] or [phone]       Fax: [phone]   RxID:   [id number]

## 2010-10-18 NOTE — Assessment & Plan Note (Signed)
Summary: f/u CP, back pain, labs   Vital Signs:  Patient profile:   55 year old male Height:      70 inches Weight:      194.6 pounds BMI:     28.02 Temp:     98.3 degrees F oral Pulse rate:   59 / minute BP sitting:   122 / 80  (left arm) Cuff size:   regular  Vitals Entered By: Gladstone Pih (September 18, 2009 8:43 AM) CC: f/u chest pain, back pain, recent labs Is Patient Diabetic? No Pain Assessment Patient in pain? yes     Location: lower back Intensity: 5 Type: aching   Primary Care Provider:  Marisue Ivan  MD  CC:  f/u chest pain, back pain, and recent labs.  History of Present Illness: 55yo M here for f/u chest pain, back pain, and recent labs  Chest pain: No further symptoms.  He was unable to go see Dr. Verdis Prime b/c of financial issues.  States that he is taking all of his meds as prescribed and has not had to use the NTG.  Back pain: He was unable to tolerate the gabapentin.  It gave him tremors even with 1 tablet.  States that his back pain is localized to the lower back and is not radiating as much as before.  No numbness or weakness of the lower extremities.  No bowel or bladder incontinence.  He rarely has to take the vicodin.  States he still has 52 tablets left from the previous prescription.  Overweight: Acknowledges that he is overweight and needs to lose weight but does not have a regimen to implement.   Preventative: Declines the prostate exam and the flu vaccine.    Habits & Providers  Alcohol-Tobacco-Diet     Tobacco Status: never  Allergies: No Known Drug Allergies  Social History: Smoking Status:  never  Review of Systems       no chest pain No numbness or weakness of the lower extremities.  No bowel or bladder incontinence.  Physical Exam  General:  VS reviewed.  Overweight, pleasant white male, NAD Msk:  Back exam: Inspection- no obvious deformities ROM- Limited flexion (more flexibility issue than pain), nl extension,  rotation, and lateral flexion Palpation- nontender along spinal column, no mass or deformity  Neg one legged back extension Neg straight leg both sitting and lying Neurologic:  Lower extremity: 5/5 knee flexion and extension 5/5 dorsal ankle flexion and plantar flexion 2+ dtrs of patellar tendon sensation grossly intact nl gait   Impression & Recommendations:  Problem # 1:  BACK PAIN (ICD-724.5) Assessment Improved  Symptoms slightly improved.  No persistent radiculopathy.  Will stop the gabapentin b/c of intolerance.  Plan to schedule the tylenol 1000mg  three times a day.  Vicodin for breakthrough pain.  He declines PT.  Will f/u as needed.   His updated medication list for this problem includes:    Aspirin Ec 81 Mg Tbec (Aspirin) .Marland Kitchen... Take 1 tablet by mouth once a day    Flexeril 10 Mg Tabs (Cyclobenzaprine hcl) .Marland Kitchen... Take 1/2 -1 tablet by mouth three times a day    Vicodin 5-500 Mg Tabs (Hydrocodone-acetaminophen) .Marland Kitchen... Take 1-2 tablet by mouth every eight hours prn    Tylenol Extra Strength 500 Mg Tabs (Acetaminophen) .Marland Kitchen... 2 tabs by mouth every 8 hours daily  Orders: [facility]- Est  Level 4 ([address])  Problem # 2:  CHEST PAIN, INTERMITTENT (ICD-786.50) Assessment: Improved  No further symptoms  since last visit on 12/14.  Advised to see Dr. Katrinka Blazing asap when he financially is able otherwise, if symptoms return, he should go to the ER for evaluation.  Orders: [facility]- Est  Level 4 ([address])  Problem # 3:  OVERWEIGHT (ICD-278.02) Assessment: Unchanged  Discussed how his weight affects his other medical problems.  He is planning on implementing some weight loss strategies.  Orders: [facility]- Est  Level 4 (28413)  Problem # 4:  HYPERLIPIDEMIA NEC/NOS (ICD-272.4) Assessment: Unchanged  LDL at goal (<100) and HDL at goal (>40).  Reviewed recent lipid panel and CMET with patient.  Will f/u in 6 months.   His updated medication list for this problem includes:    Vytorin 10-20 Mg Tabs  (Ezetimibe-simvastatin) .Marland Kitchen... Take 1 tablet by mouth once a day  Orders: [facility]- Est  Level 4 ([address])  Problem # 5:  ESSENTIAL HYPERTENSION, BENIGN (ICD-401.1) Assessment: Unchanged  At goal (<140/90).  No changes to regimen.   His updated medication list for this problem includes:    Metoprolol Tartrate 100 Mg Tabs (Metoprolol tartrate) ..... One tab by mouth bid  Orders: [facility]- Est  Level 4 (02725)  Problem # 6:  Preventive Health Care (ICD-V70.0) Assessment: Comment Only Declined flu and prostate.  Up to date on colonoscopy (2007).  Discussed briefly possible signs of OSA.  He is going to ask his wife about his night awakenings.  If it becomes a problem and persist, he is going to contact me and we'll order a splint night study.  Complete Medication List: 1)  Amitriptyline Hcl 50 Mg Tabs (Amitriptyline hcl) .... Take 1 tablet by mouth at bedtime 2)  Aspirin Ec 81 Mg Tbec (Aspirin) .... Take 1 tablet by mouth once a day 3)  Buspirone Hcl 30 Mg Tabs (Buspirone hcl) .... Take 1 tablet by mouth twice a day 4)  Fexofenadine Hcl 180 Mg Tabs (Fexofenadine hcl) .... One tab by mouth qday 5)  Flexeril 10 Mg Tabs (Cyclobenzaprine hcl) .... Take 1/2 -1 tablet by mouth three times a day 6)  Imdur 60 Mg Tb24 (Isosorbide mononitrate) .... Take 1 tablet by mouth once a day 7)  Omeprazole 20 Mg Cpdr (Omeprazole) .... One tab by mouth qday 8)  Vicodin 5-500 Mg Tabs (Hydrocodone-acetaminophen) .... Take 1-2 tablet by mouth every eight hours prn 9)  Vytorin 10-20 Mg Tabs (Ezetimibe-simvastatin) .... Take 1 tablet by mouth once a day 10)  Metoprolol Tartrate 100 Mg Tabs (Metoprolol tartrate) .... One tab by mouth bid 11)  Nitrostat 0.4 Mg Subl (Nitroglycerin) .Marland Kitchen.. 1 tab sl every 5 minutes up to 3 doses as needed for severe chest pain then call 911 12)  Tylenol Extra Strength 500 Mg Tabs (Acetaminophen) .... 2 tabs by mouth every 8 hours daily  Patient Instructions: 1)  I'll see you every 6 months but  earlier if you have any issues or concerns. 2)  Schedule the tylenol as described below. 3)  Ask your wife about your sleep habits (are you gasping for air at night?)...if so and it's bothering you, call me and we'll get a sleep study.   Prevention & Chronic Care Immunizations   Influenza vaccine: Not documented   Influenza vaccine deferral: Deferred  (09/18/2009)    Tetanus booster: 10/17/2002: Done.   Tetanus booster due: 10/17/2012    Pneumococcal vaccine: Not documented  Colorectal Screening   Hemoccult: Not documented   Hemoccult due: Not Indicated    Colonoscopy: Done.  (03/16/2006)   Colonoscopy due: 03/16/2016  Other Screening   PSA: 0.43  (08/23/2008)   PSA action/deferral: Discussed-PSA declined  (09/18/2009)   PSA due due: 08/23/2009   Smoking status: never  (09/18/2009)  Lipids   Total Cholesterol: 155  (08/29/2009)   LDL: 49  (08/29/2009)   LDL Direct: Not documented   HDL: 51  (08/29/2009)   Triglycerides: 276  (08/29/2009)    SGOT (AST): 34  (08/29/2009)   SGPT (ALT): 35  (08/29/2009)   Alkaline phosphatase: 62  (08/29/2009)   Total bilirubin: 0.5  (08/29/2009)    Lipid flowsheet reviewed?: Yes   Progress toward LDL goal: At goal  Hypertension   Last Blood Pressure: 122 / 80  (09/18/2009)   Serum creatinine: 0.88  (08/29/2009)   Serum potassium 4.4  (08/29/2009)    Hypertension flowsheet reviewed?: Yes   Progress toward BP goal: At goal  Self-Management Support :   Personal Goals (by the next clinic visit) :      Personal blood pressure goal: 140/90  (08/29/2009)     Personal LDL goal: 100  (08/29/2009)    Patient will work on the following items until the next clinic visit to reach self-care goals:     Medications and monitoring: take my medicines every day, check my blood pressure, bring all of my medications to every visit, weigh myself weekly  (09/18/2009)     Eating: drink diet soda or water instead of juice or soda, eat more  vegetables, use fresh or frozen vegetables, eat foods that are low in salt, eat baked foods instead of fried foods, eat fruit for snacks and desserts, limit or avoid alcohol  (09/18/2009)    Hypertension self-management support: BP self-monitoring log, Written self-care plan, Education handout  (09/18/2009)   Hypertension self-care plan printed.   Hypertension education handout printed    Lipid self-management support: Written self-care plan, Education handout  (09/18/2009)   Lipid self-care plan printed.   Lipid education handout printed

## 2010-10-18 NOTE — Miscellaneous (Signed)
Summary: Controlled Substances Contract  Controlled Substances Contract   Imported By: Bradly Bienenstock 11/27/2009 16:18:34  _____________________________________________________________________  External Attachment:    Type:   Image     Comment:   External Document

## 2010-10-18 NOTE — Progress Notes (Signed)
Summary: Rx Prob  Phone Note Call from Patient Call back at Home Phone [phone]   Caller: Patient Summary of Call: Checking on status of refills sent in last week.  Pharmacy says that they were denied. Initial call taken by: Clydell Hakim,  March 20, 2010 2:29 PM  Follow-up for Phone Call        patient notified that rx have been sent in . Follow-up by: Theresia Lo RN,  March 20, 2010 3:35 PM

## 2010-10-18 NOTE — Progress Notes (Signed)
Summary: Rx Req  Phone Note Refill Request Call back at Home Phone [phone] Message from:  Patient  Refills Requested: Medication #1:  METOPROLOL TARTRATE 100 MG TABS One tab by mouth BID [facility] [address] pt is now out  Initial call taken by: Clydell Hakim,  June 18, 2010 4:41 PM  Follow-up for Phone Call       Follow-up by: Golden Circle RN,  June 19, 2010 12:06 PM    Prescriptions: METOPROLOL TARTRATE 100 MG TABS (METOPROLOL TARTRATE) One tab by mouth BID  #120 x 2   Entered by:   Golden Circle RN   Authorized by:   Renold Don MD   Signed by:   Golden Circle RN on 06/19/2010   Method used:   Electronically to        [facility] # [id number]* (retail)       [address]       [place], [place]  [address]       Ph: [phone] or [phone]       Fax: [phone]   RxID:   [id number]

## 2010-10-18 NOTE — Assessment & Plan Note (Signed)
Summary: f/u med refills/bmc   Vital Signs:  Patient profile:   55 year old male Height:      70 inches Weight:      194.8 pounds BMI:     28.05 Temp:     98.5 degrees F oral Pulse rate:   58 / minute BP sitting:   126 / 88  (right arm) Cuff size:   regular  Vitals Entered By: Jimmy Footman, CMA (October 08, 2010 8:34 AM) CC: med refill Is Patient Diabetic? No Pain Assessment Patient in pain? no        Primary Care Provider:  Renold Don MD  CC:  med refill.  History of Present Illness: HTN: No adverse effects from medication.  Not checking it regularly.  Was well controlled at last visit.  No dizziness, HA, CP, palpitations, or swelling.  HLD: Tolerating medication.  No adverse effects.  No muscle pain or abd pain.  Allergies: Controlled on Fexofenadine, states Zyrtec does not work for him.  If does not take his medication, c/o watery, itchy eyes and rhinorrhea.  Asking if there are any alternatives to Fexofenadine as this is no longer covered by insurance  Nosebleed:  Patient mentions that he has started having nosebleeds.  Only noticeable when he blows his nose.  Not waking with bleeding, no headaches.  Small amount of blood from Right nostril on tissue paper.  Swelling in hand:  For past several months, has had swelling in Right hand.  Noticed a "knot" there on dorsum of hand.  No weakness, occasional mild numbness.  No pain.  Told in past that it was a cyst but he doubts this b/c he has had multiple cysts which actually sound like abscesses on back when described further  Prevention:  last colonoscopy showed multiple polyps.  FU in 2 years per patient.     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 Take 1 Tablet By Mouth At Bedtime 2)  Aspirin Ec 81 Mg Tbec (Aspirin) .... Take 1 Tablet By Mouth Once A Day 3)  Buspirone Hcl 30 Mg Tabs (Buspirone Hcl) .... Take 1 Tablet By Mouth Twice A Day 4)  Allegra 60 Mg Tabs (Fexofenadine Hcl) .Marland Kitchen.. 1 Tab By Mouth Daily As Needed For Allergies 5)  Imdur 60 Mg Tb24 (Isosorbide Mononitrate) .... Take 1 Tablet By Mouth Once A Day 6)  Omeprazole 20 Mg Cpdr (Omeprazole) .... One Tab By Mouth Qday 7)  Vytorin 10-20 Mg Tabs (Ezetimibe-Simvastatin) .... Take 1 Tablet By Mouth Once A Day 8)  Metoprolol Tartrate 100 Mg Tabs (Metoprolol Tartrate) .... One Tab By Mouth Bid 9)  Nitrostat 0.4 Mg Subl (Nitroglycerin) .Marland Kitchen.. 1 Tab Sl Every 5 Minutes Up To 3 Doses As Needed For Severe Chest Pain Then Call 911 10)  Flonase 50 Mcg/act Susp (Fluticasone Propionate) .Marland Kitchen.. 1 Spray Each Nostril Daily 11)  Hydrocodone-Acetaminophen 5-325 Mg Tabs (Hydrocodone-Acetaminophen) .Marland KitchenMarland KitchenMarland Kitchen  Take 1 Tab Every 8 Hours As Needed For Pain  Allergies (verified): No Known Drug Allergies  Review of Systems       ROS:  no headaches, pre-syncopal or syncopal episodes, chest pain, palpitations, shortness of breath or dyspnea, abdominal pain, diarrhea or constipation, melena, hematochezia, lower extremity swelling.    Physical Exam  General:  Vital signs reviewed. Well-developed, well-nourished patient in NAD.  Awake and cooperative  Nose:  some mild erythema of Right nasal turbinate.  Left WNL.  No blood noted Mouth:  no blood, no tonsillar erythema or edema  Lungs:  clear to auscultation bilaterally without wheezing, rales, or rhonchi.   Normal work of breathing  Heart:  Regular rate and rhythm without murmur, rub, or gallop.  Normal S1/S2  Extremities:  0.5 cm in diameter ganglion cyst (raised lesion, no erythema, no induration or fluctuance, no pain on palpation) on dorsum of Right hand Skin:  no rashes or lesions evident   Impression & Recommendations:  Problem # 1:  GANGLION CYST (ICD-727.43) Assessment New Discussed various modes of treatments.  Recommended [facility] appt with [place] and possible aspiration, but patient declines at this time.  States he will return if it worsens.   Orders: [facility]- Est  Level 4 ([address])  Problem # 2:  EPISTAXIS (ICD-784.7) Assessment: New Likely secondary to Flonase use.  Using twice daily.  See instructions.  Gave red flags and warnings to return.   Orders: [facility]- Est  Level 4 ([address])  Problem # 3:  ESSENTIAL HYPERTENSION, BENIGN (ICD-401.1) Assessment: Improved At goal  His updated medication list for this problem includes:    Metoprolol Tartrate 100 Mg Tabs (Metoprolol tartrate) ..... One tab by mouth bid  Orders: [facility]- Est  Level 4 ([address])  Problem # 4:  BACK PAIN (ICD-724.5) Chronic.  Uses 60 Hydrocodone every 2-3 months.  Will provide refill today.   His updated medication list for this problem includes:    Aspirin Ec 81 Mg Tbec (Aspirin) .Marland Kitchen... Take 1 tablet by mouth once a day    Hydrocodone-acetaminophen 5-325 Mg Tabs (Hydrocodone-acetaminophen) .Marland Kitchen... Take 1 tab every 8 hours as needed for pain  Orders: [facility]- Est  Level 4 ([address])  Problem # 5:  Preventive Health Care (ICD-V70.0) Colonscopy in 2 years per patient due to multiple polyps.  No GI bleeding or change in bowel habits per patient.    Complete Medication List: 1)  Amitriptyline Hcl 50 Mg Tabs (Amitriptyline hcl) .... Take 1 tablet by mouth at bedtime 2)  Aspirin Ec 81 Mg Tbec (Aspirin) .... Take 1 tablet by mouth once a day 3)  Buspirone Hcl 30 Mg Tabs (Buspirone hcl) .... Take 1 tablet by mouth twice a day 4)   Allegra 60 Mg Tabs (Fexofenadine hcl) .Marland Kitchen.. 1 tab by mouth daily as needed for allergies 5)  Imdur 60 Mg Tb24 (Isosorbide mononitrate) .... Take 1 tablet by mouth once a day 6)  Omeprazole 20 Mg Cpdr (Omeprazole) .... One tab by mouth qday 7)  Vytorin 10-20 Mg Tabs (Ezetimibe-simvastatin) .... Take 1 tablet by mouth once a day 8)  Metoprolol Tartrate 100 Mg Tabs (Metoprolol tartrate) .... One tab by mouth bid 9)  Nitrostat 0.4 Mg Subl (Nitroglycerin) .Marland Kitchen.. 1 tab sl every 5 minutes up to 3 doses as needed for severe chest pain then call 911 10)  Flonase 50 Mcg/act Susp (Fluticasone propionate) .Marland Kitchen.. 1 spray each nostril daily 11)  Hydrocodone-acetaminophen 5-325 Mg Tabs (Hydrocodone-acetaminophen) .... Take 1 tab every 8  hours as needed for pain  Patient Instructions: 1)  Take the Flonase every other day or wait for the next week or so.  It's probably the cause of your nose bleed.  2)  As for the knot on your hand, if it's still causing you problems or you're worried about it, let me know and we can send you down to Sports [facility] for an ultrasound.   3)  It was good to see you again today.  All your refills have been sent in.   Prescriptions: IMDUR 60 MG TB24 (ISOSORBIDE MONONITRATE) Take 1 tablet by mouth once a day  #90 Tablet x 0   Entered and Authorized by:   Renold Don MD   Signed by:   Renold Don MD on 10/08/2010   Method used:   Electronically to        [facility]  [address] [facility]* (retail)       [address]       [place], [place]  [address]       Ph: [phone] or [phone]       Fax: [phone]   RxID:   [id number] NITROSTAT 0.4 MG SUBL (NITROGLYCERIN) 1 tab SL every 5 minutes up to 3 doses as needed for severe chest pain then call 911  #10 x 0   Entered and Authorized by:   Renold Don MD   Signed by:   Renold Don MD on 10/08/2010   Method used:   Electronically to        [facility]  [address] [facility]* (retail)       [address]        [place], [place]  [address]       Ph: [phone] or [phone]       Fax: [phone]   RxID:   [id number] METOPROLOL TARTRATE 100 MG TABS (METOPROLOL TARTRATE) One tab by mouth BID  #120 x 2   Entered and Authorized by:   Renold Don MD   Signed by:   Renold Don MD on 10/08/2010   Method used:   Electronically to        [facility]  [address] [facility]* (retail)       [address]       [place], [place]  [address]       Ph: [phone] or [phone]       Fax: [phone]   RxID:   [id number] VYTORIN 10-20 MG TABS (EZETIMIBE-SIMVASTATIN) Take 1 tablet by mouth once a day  #90 Tablet x 0   Entered and Authorized by:   Renold Don MD   Signed by:   Renold Don MD on 10/08/2010   Method used:   Electronically to        [facility]  [address] [facility]* (retail)       [address]       [place], [place]  [address]       Ph: [phone] or [phone]       Fax: [phone]   RxID:   [id number] OMEPRAZOLE 20 MG CPDR (OMEPRAZOLE) one tab by mouth qday  #90 x 1   Entered and Authorized by:   Renold Don MD   Signed by:   Renold Don MD on 10/08/2010   Method used:  Electronically to        [id number]* (retail)       [address]       [place], [place]  [address]       Ph: [phone] or [phone]       Fax: [phone]   RxID:   [id number] BUSPIRONE HCL 30 MG TABS (BUSPIRONE HCL) Take 1 tablet by mouth twice a day  #60 x 5   Entered and Authorized by:   Renold Don MD   Signed by:   Renold Don MD on 10/08/2010   Method used:   Electronically to        [facility]  [address] [facility]* (retail)       [address]       [place], [place]  [address]       Ph: [phone] or [phone]       Fax: [phone]   RxID:   [id number] NITROSTAT 0.4 MG SUBL (NITROGLYCERIN) 1 tab SL every 5 minutes up to 3 doses as needed for severe chest pain  then call 911  #10 x 0   Entered and Authorized by:   Renold Don MD   Signed by:   Renold Don MD on 10/08/2010   Method used:   Print then Give to Patient   RxID:   [id number] METOPROLOL TARTRATE 100 MG TABS (METOPROLOL TARTRATE) One tab by mouth BID  #120 x 2   Entered and Authorized by:   Renold Don MD   Signed by:   Renold Don MD on 10/08/2010   Method used:   Print then Give to Patient   RxID:   [id number] VYTORIN 10-20 MG TABS (EZETIMIBE-SIMVASTATIN) Take 1 tablet by mouth once a day  #90 Tablet x 0   Entered and Authorized by:   Renold Don MD   Signed by:   Renold Don MD on 10/08/2010   Method used:   Print then Give to Patient   RxID:   [id number] OMEPRAZOLE 20 MG CPDR (OMEPRAZOLE) one tab by mouth qday  #90 x 1   Entered and Authorized by:   Renold Don MD   Signed by:   Renold Don MD on 10/08/2010   Method used:   Print then Give to Patient   RxID:   [id number] IMDUR 60 MG TB24 (ISOSORBIDE MONONITRATE) Take 1 tablet by mouth once a day  #90 Tablet x 0   Entered and Authorized by:   Renold Don MD   Signed by:   Renold Don MD on 10/08/2010   Method used:   Print then Give to Patient   RxID:   [id number] BUSPIRONE HCL 30 MG TABS (BUSPIRONE HCL) Take 1 tablet by mouth twice a day  #60 x 5   Entered and Authorized by:   Renold Don MD   Signed by:   Renold Don MD on 10/08/2010   Method used:   Print then Give to Patient   RxID:   [id number] HYDROCODONE-ACETAMINOPHEN 5-325 MG TABS (HYDROCODONE-ACETAMINOPHEN) Take 1 tab every 8 hours as needed for pain  #60 x 0   Entered and Authorized by:   Renold Don MD   Signed by:   Renold Don MD on 10/08/2010   Method used:   Print then Give to Patient   RxID:   [id number]    Orders Added: 1)  [facility]- Est  Level 4 [id number]

## 2010-10-18 NOTE — Consult Note (Signed)
[facility]  [facility]   Imported By: Clydell Hakim 01/11/2010 15:35:11  _____________________________________________________________________  External Attachment:    Type:   Image     Comment:   External Document  Appended Document: [facility]    Clinical Lists Changes  Observations: Added new observation of DM PROGRESS: N/A (01/11/2010 18:28) Added new observation of DM FSREVIEW: N/A (01/11/2010 18:28) Added new observation of COLONNXTDUE: 12/15/2012 (01/11/2010 18:28) Added new observation of COLONOSCOPY: multiple polyps repeat in 2 years (12/15/2009 18:29) Added new observation of LST COLON DT: 12/15/2009 (12/15/2009 18:28) Added new observation of COLONOSCOPY: abnormal (12/15/2009 18:28)      Last Colonoscopy:  Done. (03/16/2006 12:00:00 AM) Colonoscopy Result Date:  12/15/2009 Colonoscopy Result:  abnormal Colonoscopy Next Due:  3 yr    Prevention & Chronic Care Immunizations   Influenza vaccine: Not documented   Influenza vaccine deferral: Deferred  (09/18/2009)    Tetanus booster: 10/17/2002: Done.   Tetanus booster due: 10/17/2012    Pneumococcal vaccine: Not documented  Colorectal Screening   Hemoccult: Not documented   Hemoccult due: Not Indicated    Colonoscopy: multiple polyps repeat in 2 years  (12/15/2009)   Colonoscopy due: 12/15/2012  Other Screening   PSA: 0.43  (08/23/2008)   PSA action/deferral: Discussed-PSA declined  (09/18/2009)   PSA due due: 08/23/2009   Smoking status: never  (11/27/2009)  Lipids   Total Cholesterol: 155  (08/29/2009)   LDL: 49  (08/29/2009)   LDL Direct: Not documented   HDL: 51  (08/29/2009)   Triglycerides: 276  (08/29/2009)    SGOT (AST): 34  (08/29/2009)   SGPT (ALT): 35  (08/29/2009)   Alkaline phosphatase: 62  (08/29/2009)   Total bilirubin: 0.5  (08/29/2009)  Hypertension   Last Blood Pressure: 124 / 82  (11/27/2009)   Serum creatinine: 0.88   (08/29/2009)   Serum potassium 4.4  (08/29/2009)  Self-Management Support :   Personal Goals (by the next clinic visit) :      Personal blood pressure goal: 140/90  (08/29/2009)     Personal LDL goal: 100  (08/29/2009)    Hypertension self-management support: BP self-monitoring log, Written self-care plan, Education handout  (11/27/2009)    Lipid self-management support: Written self-care plan, Education handout  (09/18/2009)     Colonoscopy  Procedure date:  12/15/2009  Findings:      multiple polyps repeat in 2 years

## 2010-10-18 NOTE — Progress Notes (Signed)
Summary: refill  Phone Note Refill Request Call back at Home Phone [phone] Message from:  Patient  Refills Requested: Medication #1:  FLONASE 50 MCG/ACT SUSP 1 spray each nostril daily pt is needing this today - he is out  Initial call taken by: De Nurse,  August 31, 2010 3:33 PM    Prescriptions: FLONASE 50 MCG/ACT SUSP (FLUTICASONE PROPIONATE) 1 spray each nostril daily  #1 x 3   Entered and Authorized by:   Renold Don MD   Signed by:   Renold Don MD on 08/31/2010   Method used:   Electronically to        [facility]  [address] [facility]* (retail)       [address]       [place], [place]  [address]       Ph: [phone] or [phone]       Fax: [phone]   RxID:   [id number]

## 2010-10-18 NOTE — Miscellaneous (Signed)
Summary: rx ready  Clinical Lists Changes called to tell him his rx for hydrocodone is ready & at front desk. states he will get it tomorrow.[place] RN  March 27, 2010 4:49 PM

## 2010-10-18 NOTE — Progress Notes (Signed)
  Phone Note Refill Request Call back at [phone]   Refills Requested: Medication #1:  HYDROCODONE-ACETAMINOPHEN 5-325 MG TABS Take 1 tab every 8 hours as needed for pain. Will pick up rx when wife come in for an appt on Monday.  Pt need rx written for 5-500 instead of 5-325.  Initial call taken by: Abundio Miu,  July 25, 2010 2:34 PM  Follow-up for Phone Call        Refilled and given to secretaries to hold until patient comes in Monday.      Prescriptions: HYDROCODONE-ACETAMINOPHEN 5-325 MG TABS (HYDROCODONE-ACETAMINOPHEN) Take 1 tab every 8 hours as needed for pain  #60 x 0   Entered and Authorized by:   Renold Don MD   Signed by:   Renold Don MD on 07/25/2010   Method used:   Print then Give to Patient   RxID:   [id number]

## 2010-10-18 NOTE — Progress Notes (Signed)
Summary: Rx Req  Phone Note Refill Request Call back at Home Phone [phone] Message from:  Patient  Refills Requested: Medication #1:  VICODIN 5-500 MG TABS Take 1-2 tablet by mouth every eight hours prn Initial call taken by: Clydell Hakim,  November 09, 2009 3:33 PM  Follow-up for Phone Call        No further refills until further evaluation Follow-up by: Marisue Ivan  MD,  November 09, 2009 10:02 PM

## 2011-02-01 NOTE — Cardiovascular Report (Signed)
NAME:  Corey Watts, BEBOUT NO.:  [id number]   MEDICAL RECORD NO.:  [id number]          PATIENT TYPE:  OIB   LOCATION:  2899                         FACILITY:  [facility]   PHYSICIAN:  Lyn Records, M.D.   DATE OF BIRTH:  10/25/1955   DATE OF PROCEDURE:  05/09/2005  DATE OF DISCHARGE:  05/09/2005                              CARDIAC CATHETERIZATION   INDICATIONS FOR PROCEDURE:  The patient is 55 years of age.  Has a strong  family history of coronary disease and by cath in June 2006 had 60-70% ramus  intermedius stenosis.  Because of progressive anginal symptoms he is  undergoing repeat cardiac catheterization.  Cardiolite study in October 2005  did not demonstrate evidence of ischemia.   PROCEDURE PERFORMED:  1.  Left heart catheterization.  2.  Selective coronary angiography.  3.  Left ventriculography.  4.  Angio-Seal arteriotomy closure.   DESCRIPTION:  After informed consent, a 6-French sheath was placed in the  right femoral artery using modified Seldinger technique.  A 6-French A2  multipurpose catheter was then used for hemodynamic recordings, left  ventriculography by hand injection, and selective left and right coronary  angiography.  200 mcg of intracoronary nitroglycerin was administered after  the first left coronary injection.  The patient tolerated the procedure  without complications.  Angio-Seal arteriotomy closure was performed with  good hemostasis.   RESULTS:   I. HEMODYNAMIC DATA:  A.  Aortic pressure 124/76.  B.  Left ventricular pressure 124/11 mmHg.   II. LEFT VENTRICULOGRAPHY:  The left ventricle is faintly opacified by hand  injection.  Overall left ventricular function is normal.  Ejection fraction  60%.  No mitral regurgitation.   III. CORONARY ANGIOGRAPHY:  A.  Left main coronary:  Left main is large and  free of obstruction.  It trifurcates into the LAD, circumflex, and ramus  branch.  B.  Left main coronary:  Normal.  C.  Left  anterior descending coronary:  The LAD is large and transapical.  It gives origin to small first diagonal that contains ostial/proximal 50%  narrowing.  The vessel is of small caliber and the distribution is  relatively small.  There is mid LAD systolic compression.  No significant  obstruction is noted in the LAD territory.  D.  Ramus branch:  The ramus branch is large and bifurcates on the left  lateral wall.  Proximal to the bifurcation, there is 30-50% eccentric  narrowing.  The stenosis in this region appears to have regressed some when  compared to 14 months ago.  E.  Circumflex artery:  Circumflex gives origin to one obtuse marginal and  is normal.  F.  Right coronary:  The right coronary is large and dominant.  It gives a  bifurcating left ventricular branch and a PDA.  Distal luminal  irregularities are noted.  The RCA is essentially normal.   After intracoronary nitroglycerin was administered following the first  coronary injection, diffuse left coronary narrowing was reverted to a more  normal coronary caliber with near tripling in the vessel diameter diffusely.   Successful Angio-Seal  arteriotomy closure with good hemostasis.   CONCLUSIONS:  1.  Diffuse coronary artery spasm relieved with intracoronary nitroglycerin.  2.  Mild obstructive ramus intermedius disease (30-50%) and 50-60% proximal      diagonal #1 fixed obstruction.  There is dynamic compression of the mid      LAD due to an intramyocardial course.  3.  Normal left ventricular function.   PLAN:  Aggressive risk factor modification.  Add long-acting nitrates with  calcium channel blockers to combat generalized coronary spasm.  Continue  aspirin and statin therapy.      Lyn Records, M.D.  Electronically Signed     HWS/MEDQ  D:  05/09/2005  T:  05/09/2005  Job:  [id number]   cc:   [facility]

## 2011-02-01 NOTE — Discharge Summary (Signed)
NAME:  AMEYA, KUTZ                         ACCOUNT NO.:  [id number]   MEDICAL RECORD NO.:  [id number]                   PATIENT TYPE:  INP   LOCATION:  3705                                 FACILITY:  [facility]   PHYSICIAN:  Leighton Roach McDiarmid, M.D.             DATE OF BIRTH:  [birth year]   DATE OF ADMISSION:  02/23/2004  DATE OF DISCHARGE:  02/24/2004                                 DISCHARGE SUMMARY   PRIMARY CARE PHYSICIAN:  Dr. Nani Gasser at [facility].   CONSULTS DURING HOSPITALIZATION:  Dr. Lyn Records.   PROCEDURES DURING HOSPITALIZATION:  Cardiac catheterization.   DISCHARGE INSTRUCTIONS:   MEDICATIONS:  1. Aspirin 81 mg daily.  2. Toprol-XL 25 mg daily.  3. Nitroglycerin sublingual every 5 minutes p.r.n.  4. BuSpar 30 mg b.i.d.  5. Allegra 180 mg daily.  6. Elavil 50 mg nightly.  7. Flexeril 10 mg t.i.d. p.r.n. back spasm.  8. Prilosec 40 mg daily.  9. Aleve p.r.n.   DIET:  Low salt, low fat, increased vegetables and fruit, limit alcohol.   FOLLOWUP:  Is to call Family Practice for appointment with Dr. Linford Arnold in 2-  3 weeks.   BRIEF HISTORY AND HOSPITAL COURSE:  Mr. Karwowski is a 55 year old white male  with no prior personal cardiac history, who presents with classic chest  pain, second episode in 1 week, and was previously seen by PCP and started  on beta blocker and scheduled for cardiac followup but prior to that, was  admitted for this hospitalization.  Risk factors include tobacco and  multiple family members with chest pain and angina at early ages.  The  previous echo in 2001 was normal.   PROBLEM #1 - CHEST PAIN CONSISTENT WITH UNSTABLE ANGINA:  Was negative for  MI with no EKG changes nor cardiac enzyme changes.  Cardiology was consulted  and in light of his risk factors as well as symptoms, underwent a cardiac  cath on February 24, 2004 with significant finding of 50% to 60% mid-ramus  bifurcation stenosis; all other coronary  arteries WNL.  Recommended maximum  medical treatment, continue Toprol-XL as well as aspirin and treatment for  hyperlipidemia.   PROBLEM #2 - GASTROESOPHAGEAL REFLUX DISEASE:  Patient with this history.  He was placed on PPI during hospitalization and sent out on home medication.   PROBLEM #3 - HISTORY OF ALCOHOL ABUSE:  Patient currently unemployed with  history of 3-4 beers per day.  He was watched closely for signs and symptoms  of withdrawal, counseled as to limit or discontinue alcohol.   PROBLEM #4 - ANXIETY DISORDER:  Continue BuSpar at home doses.   PERTINENT LABORATORIES:  On February 24, 2004, WBC 4, hemoglobin 14.5, platelets  163,000; sodium 136, potassium 3.3, chloride 106, HCO2 18, BUN 15,  creatinine 1.2, glucose 129.  Lipid profile:  Cholesterol 225, triglycerides  321, HDL 51, LDL 110.  DISPOSITION:  He was discharged in good and stable condition without chest  pain, with medications and followup as above.      Ace Gins, MD                        Etta Grandchild, M.D.    JS/MEDQ  D:  04/10/2004  T:  04/10/2004  Job:  [id number]   cc:   Nani Gasser, M.D.  [address] [place], [place] [address]  Fax: [phone]   [facility]

## 2011-02-01 NOTE — Op Note (Signed)
[place]  Patient:    ALECSANDER, HATTABAUGH Visit Number: [id number] MRN: [id number]          Service Type: DSU Location: [facility] Attending Physician:  Arlis Porta Dictated by:   Adolph Pollack, M.D. Proc. Date: 02/16/02 Admit Date:  02/16/2002   CC:         Dr. Georga Hacking [facility]   Operative Report  PREOPERATIVE DIAGNOSIS:  Chronic pilonidal cysts with recurrent abscesses.  POSTOPERATIVE DIAGNOSIS:  Chronic pilonidal cysts with recurrent abscesses.  OPERATION PERFORMED:  Extensive pilonidal cystectomy.  SURGEON:  Adolph Pollack, M.D.  ANESTHESIA:  General.  INDICATIONS FOR PROCEDURE:  Mr.  Niehoff is a 55 year old male with pilonidal cyst and multiple abscesses that have been drained.  It has been recommended to him to have the pilonidal cystectomy but he has deferred up until this time and now presents for elective pilonidal cystectomy by way of the open technique.  The procedure and the risks were explained to him preoperatively.  DESCRIPTION OF PROCEDURE:  The patient was brought to the operating room on the stretcher and a general anesthetic was administered.  He was then placed prone on the operating table and pads used for pressure points.  The gluteal cleft area was shaved, then the buttocks were retracted laterally with tape. The gluteal cleft area was sterilely prepped and draped.  0.5% plain Marcaine was infiltrated around the gluteal cleft area.  Previous scars and small sinus tracts were noted.  Using electrocautery, an elliptical incision was made in the skin of the gluteal cleft.  The subcutaneous tissue was incised with the cautery. I could see where the cyst terminated and I excised the area underneath this.  I removed this piece of tissue and sent it to pathology.  Next, I inspected the wound bed and bleeding points were controlled with the cautery.  Once hemostasis was adequate, the wound was  packed with saline dampened, Kerlix gauze.  A bulky dry dressing was applied.  He subsequently was turned back into the supine position, extubated and taken to recovery room.  There were no apparent complications and he was in satisfactory condition.   Postoperatively, I have instructed his wife, who is a nurses aid, about how to do dressing changes and he will be given Tylox for pain. Dictated by:   Adolph Pollack, M.D. Attending Physician:  Arlis Porta DD:  02/16/02 TD:  02/17/02 Job: [id number]

## 2011-02-01 NOTE — H&P (Signed)
NAME:  JIMY, GATES NO.:  [id number]   MEDICAL RECORD NO.:  [id number]          PATIENT TYPE:  OIB   LOCATION:  2899                         FACILITY:  [facility]   PHYSICIAN:  Lyn Records, M.D.   DATE OF BIRTH:  1956-01-06   DATE OF ADMISSION:  05/09/2005  DATE OF DISCHARGE:                                HISTORY & PHYSICAL   PRIMARY CARE PHYSICIAN:  [facility].   CHIEF COMPLAINT:  Exertional chest pain.   HISTORY OF PRESENT ILLNESS:  Mr. Stuck is a 55 year old male patient with a  significant family medical history of coronary artery disease, prior  catheterization in June 2005, demonstrating a 60%-70% non-obstructive lesion  of the circumflex at the ramus intermediate.  A decision was made at that  time to proceed with medical treatment only.  He underwent a Cardiolite  stress test in October 2005, which did not demonstrate any ischemia,  although there was diminished inferior basilar uptake that was felt likely  due to diaphragmatic attenuation.   Over the past six months this patient has been experiencing exertional  angina.  The location of the pain is over the left anterior chest.  The  patient describes this as a pressure sensation, duration of usually five to  10 minutes, no more than 15 minutes.  This discomfort is usually resolved  after taking one sublingual nitroglycerin.  The pain goes away in about five  minutes.  He occasionally has had episodes of rest angina.  No nocturnal  awakenings.  He has not had any associated symptoms, such as shortness of  breath, diaphoresis, nausea, dizziness or palpitations, although he later  reported that he did have one episode of a cold sweat with a more prolonged  episode of chest pain.  He reported these symptoms to Dr. Lyn Records at  the office on April 23, 2005, and plans are to proceed with diagnostic  coronary angiography.  Dr. Katrinka Blazing suspects a progression of the disease in  the ramus  intermedius of the circumflex.   REVIEW OF SYSTEMS:  Essentially noncontributory.  No cough, fevers, chills,  colds, myalgias.  No abdominal pain, no reflux, no difficulty urinating, no  nocturia, no dark or bloody stools.  No bruising, no extremity swelling, no  tachy palpitations, no dizziness, no syncope or near syncope.   PAST MEDICAL HISTORY:  1.  A cardiac catheterization in June 2006, demonstrating a mid-60% to 70%      lesion at the bifurcation of the ramus intermedius and circumflex      vessel.  2.  Back surgery x2.  3.  Pilonidal cyst surgery.   FAMILY HISTORY:  His father passed away recently.  He had his first stent at  age 68.  His mother has a history of a percutaneous transluminal coronary  angiography and emergent coronary artery bypass graft surgery x4.  The onset  of her coronary disease was at age 88.  A sister had stents in June 2005.   ALLERGIES:  BENADRYL which causes swelling.  ZANTAC which causes hiccups.   CURRENT MEDICATIONS:  1.  Allegra 180 mg daily.  2.  BuSpar 50 mg b.i.d.  3.  Elavil 50 mg at hours sleep.  4.  Prilosec 30 mg daily.  5.  Toprol XL 50 mg daily.  6.  Flexeril 10 mg t.i.d. p.r.n.  7.  Multivitamin daily.  8.  Aspirin 81 mg daily.  9.  Aleve p.r.n.  10. Nitroglycerin sublingual 0.4 mg p.r.n.  The patient needs a refill.   PHYSICAL EXAMINATION:  GENERAL:  This is a pleasant well-developed male  currently in no acute distress, no complaints of chest pain.  VITAL SIGNS:  Temperature 98.8 degrees, blood pressure 123/91, pulse 76 and  regular, respirations 20.  HEENT:  Head normocephalic.  Sclerae not injected.  Oral mucosal membranes  pink and moist.  NECK:  Supple, no adenopathy.  NEUROLOGIC:  The patient is alert and oriented x3, moving all extremities  x4.  No focal neurological deficits.  Deep tendon reflexes were not  assessed.  CHEST:  Bilateral lung sounds include auscultation posteriorly.  Respiratory  effort is non-labored  on room air.  He is currently saturating 98% on room  air.  HEART:  Cardiac sounds are S1, S2.  No rubs, murmurs, thrills or gallops.  No jugular venous distention.  Carotids 2+ bilaterally without bruits.  Apical radial pulse is regular.  ABDOMEN:  Soft, nontender, non-distended without hepatosplenomegaly, masses  or bruits.  EXTREMITIES:  Symmetrical in appearance without clubbing, cyanosis or edema.  Pulses are palpable easily, irregular at 2+.  Femorals 1+, pedal 1+.   LABORATORY DATA:  Sodium 139, potassium 4.5, BUN 17, creatinine 1.1.  WBC  14,600, hemoglobin 14.5, platelet count 167.  INR 0.9, PTT 23.   DIAGNOSTICS:  Electrocardiogram done today shows a normal sinus rhythm.  No  ischemic T-wave or ST-segment changes.  Normal electrocardiogram.   A 2-view chest x-ray shows no acute disease.  Borderline cardiomegaly.   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   PLAN:  The patient is to undergo a diagnostic coronary angiography today per  Dr. Katrinka Blazing in the [facility].  The risks and benefits of this procedure have previously been  discussed with the patient by Dr. Katrinka Blazing, and he wishes to proceed.  If the  patient stays overnight, his lipid status is unknown.  We will need to  follow up on the fasting lipid panel.  Otherwise continue medications as per  pre-catheterization.  Any medication changes will be made, based on the  results of the cardiac catheterization.      Allison L. Rennis Harding, N.P.      Lyn Records, M.D.  Electronically Signed    ALE/MEDQ  D:  05/09/2005  T:  05/09/2005  Job:  [id number]   cc:   [facility]

## 2011-02-01 NOTE — H&P (Signed)
NAME:  Corey, Watts                         ACCOUNT NO.:  [id number]   MEDICAL RECORD NO.:  [id number]                   PATIENT TYPE:  INP   LOCATION:  3705                                 FACILITY:  [facility]   PHYSICIAN:  Leighton Roach McDiarmid, M.D.             DATE OF BIRTH:  02-16-1956   DATE OF ADMISSION:  02/23/2004  DATE OF DISCHARGE:  02/24/2004                                HISTORY & PHYSICAL   CHIEF COMPLAINT:  Chest pain.   HISTORY OF PRESENT ILLNESS:  This is a 55 year old male with no prior  history of cardiac disease who awoke this morning with substernal chest pain  that felt like a tight chest pressure that radiated to his left arm and he  also had diaphoresis.  The patient noticed the first episode of chest pain  approximately two to three weeks ago, and saw his primary care physician,  Dr. Nani Gasser for this, for which she started him on Toprol XL 25  mg daily and an aspirin 81 mg daily.  He has had no further symptoms until  this morning when the patient took one sublingual nitroglycerin and it  decreased his chest pain from 5/10 to 2/10, and then the chest pain  increased in severity again to 8/10, he took another nitroglycerin and it  decreased as well, at which point in time it would not go away, at which  time the patient became somewhat fearful.  He was previously scheduled to  see Dr. Verdis Prime as an outpatient, but had appointment cancelled  secondary to scheduling problems.  He was sleeping at the time the chest  pain occurred, but has also noticed that he has had some decreased activity  tolerance over the last several months, including some shortness of breath  with mowing the lawn or any other similar type activity.  The patient denies  any syncope, dizziness, or weakness.  He denied any cough, orthopnea, or  wheeze.  He denied any indigestion or nausea or vomiting.  He denied any  significant extremity swelling or pain.   PAST MEDICAL  HISTORY:  1. Anxiety.  2. Alcohol abuse.  3. Hemorrhoids.  4. Gastroesophageal reflux disease.  5. Irritable bowel syndrome.  6. Back pain.  7. Tobacco abuse.  8. Angina.   MEDICATIONS:  1. Allegra 180 mg daily.  2. BuSpar 30 mg b.i.d.  3. Elavil 50 mg q.h.s.  4. Flexeril 10 mg t.i.d. p.r.n.  5. Prilosec 40 mg daily.  6. Toprol XL 25 mg daily.  7. Aspirin 81 mg daily.  8. Aleve p.r.n.   ALLERGIES:  1. ZANTAC.  2. BENADRYL.   SOCIAL HISTORY:  The patient was a former 82 pack year history smoker, has  stopped for the last 13 years.  He does drink significant alcohol, about 3  to 4 beers per day, but never had any history of DT's or withdrawals.  Used  to  have heavier drinking where he used to drink about 12 drinks a day.  Currently unemployed, on disability for back injury.  He lives with his wife  in [place].   FAMILY HISTORY:  Positive for significant multiple family members have  coronary artery disease and cardiac procedures at an early age, including a  sister with stent placed in her late 99s.  Father has had a MI and  peripheral vascular disease.  Mother has had status post CABG, hypertension,  diabetes, and high cholesterol.  She also sees Dr. Katrinka Blazing.   PHYSICAL EXAMINATION:  VITAL SIGNS:  At time of examination, the patient's  vital signs are stable.  GENERAL:  He is a well-appearing male in no acute distress.  He is sitting  upright in bed, alert and oriented x3, and no complaints of chest pain.  HEENT:  His conjunctivae are clear.  Pupils equal, round, reactive to light  and accommodation.  Extraocular movements were intact.  Nasal turbinates are  non-edematous.  Oropharynx is moist without exudates.  NECK:  Supple with no thyroid enlargement, no cervical lymphadenopathy, no  JVD.  LUNGS:  Clear to auscultation bilaterally with good respiratory effort.  CARDIAC:  Regular rate and rhythm.  S1 and S2.  No rubs or gallops.  No  carotid bruits.  He has 2+ DP and  radial pulses.  No lower extremity edema.  MUSCULOSKELETAL:  He has no clubbing or cyanosis.  GASTROINTESTINAL:  Soft, nontender, nondistended, positive bowel sounds.  No  palpable masses or ausculted bruits.  SKIN:  No rashes or edema.  NEUROLOGIC:  Cranial nerves are grossly intact.   LABORATORY DATA:  Laboratory tests and point-of-care markers were negative  x3 with a myoglobin of 267, although troponin-I's were negative.  EKG shows  sinus rhythm with poor R-wave progression with horizontal ST changes in the  lateral leads, and some questionable small Q-waves in the anterior leads.  Chest x-ray showed no __________disease, no cardiomegaly.   ASSESSMENT AND PLAN:  1. This is a 55 year old male with chest pain.  The patient is with what     sounds like possible unstable angina, sounds very similar to his     presenting angina approximately three weeks ago, but seems to be worse.     At this point in time, it does respond to nitroglycerin.  The patient     does have risk factors of smoking, male, and positive family history at     this point in time.  The patient's diastolic blood pressures were mildly     elevated even on beta blockade.  He had some nonspecific EKG changes at     this point in time as well.  Previous EKG of April 2001, was originally     normal, but at this point in time the patient needs further risk     stratification.  We will consult cardiology, Dr. Verdis Prime, for     possible risk stratification, including, but not limited to stress     echocardiogram versus a cardiac catheterization.  The patient will be     continued on aspirin 325 mg, Toprol XL 25 mg daily.  We will cycle     cardiac enzymes and repeat EKG in the morning.  2. Gastroesophageal reflux disease.  We will place the patient on Protonix     40 mg p.o. daily.  3. Anxiety.  We will continue the patient on his BuSpar and Elavil as     previously prescribed.  4. Alcohol abuse.  No history or signs at  this point in time of alcohol     withdrawal.  We will watch carefully, but we will not start on Ativan     taper at this point in time.      Alvira Philips, M.D.                      Etta Grandchild, M.D.    RM/MEDQ  D:  02/24/2004  T:  02/25/2004  Job:  [id number]   cc:   Lyn Records III, M.D.  [address]  [address]  [place]  [place] [address]  Fax: [phone]   Nani Gasser, M.D.  [address] [place], [place] [address]  Fax: [phone]

## 2011-02-01 NOTE — Cardiovascular Report (Signed)
NAME:  Corey Watts, Corey Watts                         ACCOUNT NO.:  [id number]   MEDICAL RECORD NO.:  [id number]                   PATIENT TYPE:  INP   LOCATION:  3705                                 FACILITY:  [facility]   PHYSICIAN:  Lesleigh Noe, M.D.            DATE OF BIRTH:  October 20, 1955   DATE OF PROCEDURE:  02/24/2004  DATE OF DISCHARGE:  02/24/2004                              CARDIAC CATHETERIZATION   INDICATION FOR PROCEDURE:  Recurrent chest discomfort, possibly consistent  with unstable angina.   PROCEDURE PERFORMED:  1. Left heart catheterization.  2. Selective coronary angiogram.  3. Left ventriculography.  4. Angio-Seal arteriotomy closure.   DESCRIPTION:  The patient was brought to the cath lab.  IV Versed 1 mg was  administered.  Informed consent had been previously obtained before arrival  in the lab.  Xylocaine 2% was used and a 6 [place] sheath was placed in the  right femoral artery using a modified Seldinger technique.  A 6 French A2  multipurpose catheter was used for hemodynamic recordings, left  ventriculography by hand injection, selective left and right coronary  angiography.  Sheathogram was performed in the right femoral and Angio-Seal  arteriotomy closure was performed without complications with good  hemostasis.   RESULTS:  A. Hemodynamic data.     A. Aortic pressure 124/85.     B. Left ventricular pressure 124/8 mmHg.   A. Left ventriculography:  There is faint left ventricular opacification.     No significant wall motion abnormalities are noted.  EF is greater than     50%.  No MR.   A. Coronary angiography.     A. Left main coronary:  Relatively short, free of any significant        obstruction.     B. Left anterior descending coronary:  This is a large vessel that is        transapical.  No obstructions are noted.     C. Ramus intermedius branch:  The ramus is large, bifurcates on the left        lateral wall.  There is 60% obstruction at and  within this        bifurcation.  There does not appear to be a more significant narrowing        within this region than that.     D. Circumflex artery:  The circumflex gives origin to one moderate-sized        obtuse marginal and is normal.     E. Right coronary:  The right coronary gives to small PDA, two small left        ventricular branches and is normal.   A. Successful Angio-Seal arteriotomy closure.   CONCLUSIONS:  1. A 50-60% mid ramus intermedius obstruction.  This could possibly     represent the patient's culprit for anginal symptoms if there is ruptured     plaque transient thrombosis and/or  coronary artery spasm.  This stenosis     does not appear to be hemodynamically significant at this time and I     believe it is advisable at this point to manage this with aggressive risk     factor modification including daily aspirin, statin therapy, and beta-     blocker therapy, Toprol XL 50 mg per day.  2. Normal left ventricular function.                                               Lesleigh Noe, M.D.    HWS/MEDQ  D:  02/24/2004  T:  02/26/2004  Job:  [id number]   cc:   Alvira Philips, M.D.  [facility].  Family Prac. Resident  [place]  [place] [address]  Fax: [phone]   Nani Gasser, M.D.  [address] [place], [place] [address]  Fax: [phone]   Asencion Partridge, M.D.  Fax: [phone]

## 2011-02-13 ENCOUNTER — Other Ambulatory Visit: Payer: Self-pay | Admitting: Family Medicine

## 2011-02-13 NOTE — Telephone Encounter (Signed)
Refill request

## 2011-02-20 ENCOUNTER — Other Ambulatory Visit: Payer: Self-pay | Admitting: Family Medicine

## 2011-02-20 NOTE — Telephone Encounter (Signed)
Refill request

## 2011-02-28 ENCOUNTER — Other Ambulatory Visit: Payer: Self-pay | Admitting: Family Medicine

## 2011-02-28 NOTE — Telephone Encounter (Signed)
Refill request

## 2011-03-01 NOTE — Telephone Encounter (Signed)
Refill request

## 2011-04-09 ENCOUNTER — Other Ambulatory Visit: Payer: Self-pay | Admitting: Family Medicine

## 2011-04-09 NOTE — Telephone Encounter (Signed)
Refill request

## 2011-06-04 ENCOUNTER — Ambulatory Visit (INDEPENDENT_AMBULATORY_CARE_PROVIDER_SITE_OTHER): Payer: Self-pay | Admitting: Family Medicine

## 2011-06-04 VITALS — BP 127/85 | HR 63 | Temp 98.5°F | Wt 190.0 lb

## 2011-06-04 DIAGNOSIS — M549 Dorsalgia, unspecified: Secondary | ICD-10-CM

## 2011-06-04 DIAGNOSIS — K219 Gastro-esophageal reflux disease without esophagitis: Secondary | ICD-10-CM

## 2011-06-04 DIAGNOSIS — H6120 Impacted cerumen, unspecified ear: Secondary | ICD-10-CM

## 2011-06-04 DIAGNOSIS — E785 Hyperlipidemia, unspecified: Secondary | ICD-10-CM

## 2011-06-04 LAB — LIPID PANEL
LDL Cholesterol: 24 mg/dL (ref 0–99)
VLDL: 75 mg/dL — ABNORMAL HIGH (ref 0–40)

## 2011-06-04 MED ORDER — OMEPRAZOLE 20 MG PO CPDR: 20.0000 mg | DELAYED_RELEASE_CAPSULE | Freq: Every day | ORAL | Status: DC

## 2011-06-04 MED ORDER — HYDROCODONE-ACETAMINOPHEN 5-325 MG PO TABS: 1.0000 | ORAL_TABLET | Freq: Three times a day (TID) | ORAL | Status: DC | PRN

## 2011-06-04 NOTE — Progress Notes (Signed)
  Subjective:    Patient ID: JOEANGEL JEANPAUL, male    DOB: [birth year], 55 y.o.   MRN: [id number]  HPI 1.  Med Refills:  Patient here for medication refills. However the computer system is down and we're unable to look up his medications. He did not bring a list of his medications with him. He states he needs his reflux medication, Vicodin, Flexeril, BuSpar refilled. He is unsure if he needs any further medication refills.  2. Left ear stuffiness: Patient states Left ear is stuffy for the past several days, about 4-5 days. He does not have any decreased hearing, no dizziness, no ear pain. States it feels like it's under water. He like to have it checked. No fevers, no recent illnesses. No URI symptoms.   Review of Systems See HPI above for review of systems.       Objective:   Physical Exam Gen:  Alert, cooperative patient who appears stated age in no acute distress.  Vital signs reviewed. Head:  [place]/AT Eyes: No conjunctival injection. Pupils are round reactive to light. Extraocular movements intact. Nose nasal turbinates within normal limits Ears right ear externally within normal limits. Ear canal normal. Tympanic membranes pearly-gray. Left ear with cerumen impaction. Unable to appreciate tympanic membrane.  **Exam of left ear s/p irrigation: Minimal erythema of the auditory canal. Tympanic membrane intact pearly gray and nonerythematous.       Assessment & Plan:

## 2011-06-04 NOTE — Assessment & Plan Note (Signed)
Ear irrigation performed in clinic. Tympanic membrane inspected after irrigation. No further impaction. The patient feels much better.

## 2011-06-04 NOTE — Assessment & Plan Note (Addendum)
Refilled Vicodin on paper script as computer was down.  I do not see that he actually takes Flexeril. Will not refill less if there is no original prescription.

## 2011-06-04 NOTE — Assessment & Plan Note (Signed)
States is currently controlled. We'll refill omeprazole today.

## 2011-07-30 ENCOUNTER — Other Ambulatory Visit: Payer: Self-pay | Admitting: Family Medicine

## 2011-07-30 NOTE — Telephone Encounter (Signed)
Refill request

## 2011-08-12 ENCOUNTER — Other Ambulatory Visit: Payer: Self-pay | Admitting: Family Medicine

## 2011-08-12 NOTE — Telephone Encounter (Signed)
Refill request

## 2011-08-28 ENCOUNTER — Telehealth: Payer: Self-pay | Admitting: Family Medicine

## 2011-08-28 NOTE — Telephone Encounter (Signed)
Asking to speak with RN re: meds, there was a mixup with his buspar, when pt had it filled a couple months ago the pharmacy did not have the 30 mg tablets so they gave him the 15 mg and told pt to take 2. This last time pt picked up he assumed he was still taking 2 tablets (30 mg total) but the pharmacy did not tell him they went back to the 30 mgs so pt has been taking 60 mg and is now almost out. The pharmacy said ins will not pay for another refill until 12/24, pts wife wants to know if we can substitute another rx until pt can get this one or what other options does he have?

## 2011-08-30 MED ORDER — HYDROXYZINE HCL 10 MG PO TABS
10.0000 mg | ORAL_TABLET | Freq: Three times a day (TID) | ORAL | Status: AC | PRN
Start: 2011-08-30 — End: 2011-09-09

## 2011-08-30 NOTE — Telephone Encounter (Signed)
Hydroxyzine can work in the meantime.  Will call in 10 days worth.

## 2011-09-24 ENCOUNTER — Ambulatory Visit (INDEPENDENT_AMBULATORY_CARE_PROVIDER_SITE_OTHER): Payer: Self-pay | Admitting: Family Medicine

## 2011-09-24 DIAGNOSIS — E785 Hyperlipidemia, unspecified: Secondary | ICD-10-CM

## 2011-09-24 DIAGNOSIS — I10 Essential (primary) hypertension: Secondary | ICD-10-CM

## 2011-09-24 DIAGNOSIS — H6120 Impacted cerumen, unspecified ear: Secondary | ICD-10-CM

## 2011-09-24 DIAGNOSIS — F1010 Alcohol abuse, uncomplicated: Secondary | ICD-10-CM

## 2011-09-24 DIAGNOSIS — K219 Gastro-esophageal reflux disease without esophagitis: Secondary | ICD-10-CM

## 2011-09-24 DIAGNOSIS — M549 Dorsalgia, unspecified: Secondary | ICD-10-CM

## 2011-09-24 DIAGNOSIS — I2510 Atherosclerotic heart disease of native coronary artery without angina pectoris: Secondary | ICD-10-CM

## 2011-09-24 LAB — COMPREHENSIVE METABOLIC PANEL
AST: 36 U/L (ref 0–37)
Albumin: 4.6 g/dL (ref 3.5–5.2)
Alkaline Phosphatase: 51 U/L (ref 39–117)
BUN: 15 mg/dL (ref 6–23)
Calcium: 9.8 mg/dL (ref 8.4–10.5)
Chloride: 103 mEq/L (ref 96–112)
Glucose, Bld: 96 mg/dL (ref 70–99)
Potassium: 4.5 mEq/L (ref 3.5–5.3)
Sodium: 139 mEq/L (ref 135–145)
Total Protein: 7.2 g/dL (ref 6.0–8.3)

## 2011-09-24 LAB — CBC
MCH: 34.7 pg — ABNORMAL HIGH (ref 26.0–34.0)
MCHC: 33 g/dL (ref 30.0–36.0)
Platelets: 137 10*3/uL — ABNORMAL LOW (ref 150–400)

## 2011-09-24 MED ORDER — HYDROCODONE-ACETAMINOPHEN 5-325 MG PO TABS: 1.0000 | ORAL_TABLET | Freq: Three times a day (TID) | ORAL | Status: DC | PRN

## 2011-09-24 MED ORDER — EZETIMIBE-SIMVASTATIN 10-20 MG PO TABS: 1.0000 | ORAL_TABLET | Freq: Every day | ORAL | Status: DC

## 2011-09-24 MED ORDER — BUSPIRONE HCL 30 MG PO TABS: 30.0000 mg | ORAL_TABLET | Freq: Two times a day (BID) | ORAL | Status: DC

## 2011-09-24 MED ORDER — METOPROLOL TARTRATE 100 MG PO TABS: 100.0000 mg | ORAL_TABLET | Freq: Two times a day (BID) | ORAL | Status: DC

## 2011-09-24 MED ORDER — OMEPRAZOLE 20 MG PO CPDR: 20.0000 mg | DELAYED_RELEASE_CAPSULE | Freq: Every day | ORAL | Status: DC

## 2011-09-24 MED ORDER — FLUTICASONE PROPIONATE 50 MCG/ACT NA SUSP: 1.0000 | Freq: Every day | NASAL | Status: DC

## 2011-09-24 MED ORDER — NITROGLYCERIN 0.4 MG SL SUBL: 0.4000 mg | SUBLINGUAL_TABLET | SUBLINGUAL | Status: DC | PRN

## 2011-09-24 MED ORDER — ISOSORBIDE MONONITRATE ER 60 MG PO TB24: 60.0000 mg | ORAL_TABLET | Freq: Every day | ORAL | Status: DC

## 2011-09-24 NOTE — Progress Notes (Signed)
  Subjective:    Patient ID: Corey Watts, male    DOB: 1956-04-17, 56 y.o.   MRN: [id number]  HPI Patient here for medication refills and checkup.  No acute concerns.  Chronic problems include back pain, esophagitis, hypertension.  Hypertension:  Long-term problem for this patient.  No adverse effects from medication.  Not checking it regularly.  No HA, CP, dizziness, shortness of breath, palpitations, or LE swelling.   BP Readings from Last 3 Encounters:  09/24/11 134/84  06/04/11 127/85  10/08/10 126/88   Back pain:  Chronic problem.  Controlled with Hydrocodone.  Does not take any other Tylenol or Ibuprofen.  Does back exercises every several days.  Describes back pain as ache in lumbar region.  No fevers or chills.    HLD:  Last lipid panel listed below.    Currently on Vytorin.  Denies any myalgias, icterus, jaundice.  Tolerating medications well.   Lab Results  Component Value Date   CHOL 158 06/04/2011   CHOL 155 08/29/2009   CHOL 151 08/23/2008   Lab Results  Component Value Date   HDL 59 06/04/2011   HDL 51 14/78/2956   HDL 58 08/23/2008   Lab Results  Component Value Date   LDLCALC 24 06/04/2011   LDLCALC 49 08/29/2009   LDLCALC 35 08/23/2008   Lab Results  Component Value Date   TRIG 377* 06/04/2011   TRIG 276* 08/29/2009   TRIG 290* 08/23/2008   Lab Results  Component Value Date   CHOLHDL 2.7 06/04/2011   CHOLHDL 3.0 Ratio 08/29/2009   CHOLHDL 2.6 Ratio 08/23/2008    GERD:  Currently controlled at Omeprazole dose.  Takes daily.  Denies any nausea, vomiting.  Describes symptoms of GERD as burning and brash, but experiences these infrequently.    Alcohol use:  States he and wife have cut back.  Drinking 1 six pack a weekend.  No tremors during week.  No hallucinations.     Review of Systems See HPI above for review of systems.       Objective:   Physical Exam Gen:  Alert, cooperative patient who appears stated age in no acute distress.  Vital signs  reviewed. HEENT:  [place]/AT.  EOMI, PERRL.  MMM, tonsils non-erythematous, non-edematous.  External ears WNL, Bilateral TM's normal without retraction, redness or bulging.  Cardiac:  Regular rate and rhythm without murmur auscultated.  Good S1/S2. Pulm:  Clear to auscultation bilaterally with good air movement.  No wheezes or rales noted.   Abd:  Soft/nondistended/nontender.  Good bowel sounds throughout all four quadrants.  No masses noted.  Ext:  No clubbing/cyanosis/erythema.  No edema noted bilateral lower extremities.         Assessment & Plan:

## 2011-09-24 NOTE — Assessment & Plan Note (Signed)
On Vytorin. Will check CMET today. Last lipid panel showed good LDL but high triglycerides. Discussed diet control of trig's with patient.

## 2011-09-24 NOTE — Assessment & Plan Note (Signed)
Improved.  No signs of DT's.

## 2011-09-24 NOTE — Assessment & Plan Note (Signed)
At goal.  Recheck labs today.  No changes to meds

## 2011-09-24 NOTE — Patient Instructions (Signed)
It was good to see you today. I'll send you a letter with your lab results.  If you have any questions let me know.

## 2011-09-24 NOTE — Assessment & Plan Note (Signed)
Sees Dr. Garnette Scheuermann cardiology. Told to return 1 year ago in about 2 years.   No chest pain currently during exertion..  Last Nitro use was sometime in mid-summer.

## 2011-09-24 NOTE — Assessment & Plan Note (Signed)
Refilled Vicodin today. I do not suspect diversion or abuse in Corey Watts.  His last refill was in September, 60 pills last him about 3-4 months usually.

## 2011-09-24 NOTE — Assessment & Plan Note (Signed)
Continue Omeprazole ?

## 2011-09-25 ENCOUNTER — Encounter: Payer: Self-pay | Admitting: Family Medicine

## 2011-11-27 ENCOUNTER — Other Ambulatory Visit: Payer: Self-pay | Admitting: Family Medicine

## 2011-11-27 NOTE — Telephone Encounter (Signed)
Refill request

## 2011-12-24 ENCOUNTER — Other Ambulatory Visit: Payer: Self-pay | Admitting: Family Medicine

## 2011-12-26 ENCOUNTER — Other Ambulatory Visit: Payer: Self-pay | Admitting: Family Medicine

## 2012-02-24 ENCOUNTER — Other Ambulatory Visit: Payer: Self-pay | Admitting: Family Medicine

## 2012-02-24 DIAGNOSIS — M549 Dorsalgia, unspecified: Secondary | ICD-10-CM

## 2012-02-24 MED ORDER — HYDROCODONE-ACETAMINOPHEN 5-325 MG PO TABS: 1.0000 | ORAL_TABLET | Freq: Three times a day (TID) | ORAL | Status: DC | PRN

## 2012-04-06 ENCOUNTER — Other Ambulatory Visit: Payer: Self-pay | Admitting: Family Medicine

## 2012-05-25 ENCOUNTER — Other Ambulatory Visit: Payer: Self-pay | Admitting: Family Medicine

## 2012-06-17 ENCOUNTER — Other Ambulatory Visit: Payer: Self-pay | Admitting: Family Medicine

## 2012-07-10 ENCOUNTER — Other Ambulatory Visit: Payer: Self-pay | Admitting: Gastroenterology

## 2012-07-24 ENCOUNTER — Encounter: Payer: Self-pay | Admitting: *Deleted

## 2012-08-19 ENCOUNTER — Ambulatory Visit: Payer: Self-pay | Admitting: Family Medicine

## 2012-08-21 ENCOUNTER — Encounter: Payer: Self-pay | Admitting: Family Medicine

## 2012-08-21 ENCOUNTER — Ambulatory Visit (INDEPENDENT_AMBULATORY_CARE_PROVIDER_SITE_OTHER): Payer: Medicare Other | Admitting: Family Medicine

## 2012-08-21 VITALS — BP 121/76 | HR 69 | Temp 98.7°F | Ht 72.0 in | Wt 199.0 lb

## 2012-08-21 DIAGNOSIS — K219 Gastro-esophageal reflux disease without esophagitis: Secondary | ICD-10-CM

## 2012-08-21 DIAGNOSIS — F411 Generalized anxiety disorder: Secondary | ICD-10-CM

## 2012-08-21 DIAGNOSIS — I10 Essential (primary) hypertension: Secondary | ICD-10-CM

## 2012-08-21 DIAGNOSIS — R040 Epistaxis: Secondary | ICD-10-CM

## 2012-08-21 DIAGNOSIS — F1010 Alcohol abuse, uncomplicated: Secondary | ICD-10-CM

## 2012-08-21 DIAGNOSIS — M549 Dorsalgia, unspecified: Secondary | ICD-10-CM

## 2012-08-21 DIAGNOSIS — E785 Hyperlipidemia, unspecified: Secondary | ICD-10-CM

## 2012-08-21 MED ORDER — FEXOFENADINE HCL 60 MG PO TABS: 60.0000 mg | ORAL_TABLET | Freq: Every day | ORAL | Status: DC | PRN

## 2012-08-21 MED ORDER — METOPROLOL TARTRATE 100 MG PO TABS: 100.0000 mg | ORAL_TABLET | Freq: Two times a day (BID) | ORAL | Status: DC

## 2012-08-21 MED ORDER — NITROGLYCERIN 0.4 MG SL SUBL: 0.4000 mg | SUBLINGUAL_TABLET | SUBLINGUAL | Status: DC | PRN

## 2012-08-21 MED ORDER — CYCLOBENZAPRINE HCL 10 MG PO TABS: 10.0000 mg | ORAL_TABLET | Freq: Two times a day (BID) | ORAL | Status: DC | PRN

## 2012-08-21 MED ORDER — ISOSORBIDE MONONITRATE ER 60 MG PO TB24: 60.0000 mg | ORAL_TABLET | Freq: Every day | ORAL | Status: DC

## 2012-08-21 MED ORDER — EZETIMIBE-SIMVASTATIN 10-20 MG PO TABS: 1.0000 | ORAL_TABLET | Freq: Every day | ORAL | Status: DC

## 2012-08-21 MED ORDER — HYDROCODONE-ACETAMINOPHEN 5-325 MG PO TABS: 1.0000 | ORAL_TABLET | Freq: Three times a day (TID) | ORAL | Status: DC | PRN

## 2012-08-21 MED ORDER — AMITRIPTYLINE HCL 50 MG PO TABS: 50.0000 mg | ORAL_TABLET | Freq: Every evening | ORAL | Status: DC | PRN

## 2012-08-21 MED ORDER — BUSPIRONE HCL 30 MG PO TABS: 30.0000 mg | ORAL_TABLET | Freq: Two times a day (BID) | ORAL | Status: DC

## 2012-08-21 MED ORDER — OMEPRAZOLE 20 MG PO CPDR: 20.0000 mg | DELAYED_RELEASE_CAPSULE | Freq: Every day | ORAL | Status: DC

## 2012-08-21 NOTE — Assessment & Plan Note (Signed)
Resolved when stopping Flonase.   Rhinitis controlled with Allegra.

## 2012-08-21 NOTE — Assessment & Plan Note (Signed)
Improved previously, now worse again.  Not interested in cutting back.   CAGE positive for becoming Annoyed with people asking if he needs to cut back.  Declined any outpatient services.

## 2012-08-21 NOTE — Patient Instructions (Addendum)
It was good to see you again today.  Come back and see me in 6 months or sooner if needed.    Refills today.

## 2012-08-21 NOTE — Assessment & Plan Note (Signed)
Lipid panel next visit 

## 2012-08-21 NOTE — Assessment & Plan Note (Signed)
At goal. No changes.  Refills provided.

## 2012-08-21 NOTE — Assessment & Plan Note (Addendum)
Stable.  Controlled with Buspar.   Jury duty note written as he feels this precludes jury dute.

## 2012-08-21 NOTE — Assessment & Plan Note (Signed)
Refill for hydrocodone today. As before, no concerns for abuse or diversion. Helps him with ADLs.  FU in 6 months.

## 2012-08-21 NOTE — Progress Notes (Signed)
Patient ID: Shunsuke Granzow., male   DOB: Aug 05, 1956, 56 y.o.   MRN: [id number] ADOLF ORMISTON Sr. is a 56 y.o. male who presents to [facility] today for 6 month follow-up.  He has several chronic issues but no current concerns or acute issues:  1. Hypertension:  Long-term problem for this patient.  No adverse effects from medication.  Not checking it regularly.  No HA, CP, dizziness, shortness of breath, palpitations, or LE swelling.   BP Readings from Last 3 Encounters:  08/21/12 121/76  09/24/11 134/84  06/04/11 127/85   2.  Chronic back pain:  Chronic issue for patient, worse after lifting large objects or with changes in weather.  Controlled with occasionaly hydrocodone use, 60 pills last him anywhere from 3 - 6 months.  Describes as dull aching pain in bilateral lumbar region when painful.   No dysuria, hematuria, urinary frequency, radiation of pain to legs, motor weakness, decreased sensation, or headaches.  No fevers or chills.  No bladder/bowel incontinence or saddle anesthesia.     3.  GERD:  Present on most days.  Knows which foods are his triggers and mostly avoids these.  Resolved with GERD.  Knows when he doesn't take his prilosec because GERD will worsen.  Chronic issue (years).  No hematemesis, nausea, vomiting, abdominal pain.  Symptoms include epigastric burning.  Drinks anywhere from 1-2 to 6 beers a day (has increased after cutting back previously).      The following portions of the patient's history were reviewed and updated as appropriate: allergies, current medications, past medical history, family and social history, and problem list.  Patient is a nonsmoker.  No past medical history on file.  ROS as above otherwise neg. No Chest pain, palpitations, SOB, Fever, Chills, Abd pain, N/V/D.  Medications reviewed. Current Outpatient Prescriptions  Medication Sig Dispense Refill  . amitriptyline (ELAVIL) 50 MG tablet take 1 tablet by mouth at bedtime  30 tablet  4  .  amitriptyline (ELAVIL) 50 MG tablet take 1 tablet by mouth at bedtime  30 tablet  4  . aspirin 81 MG EC tablet Take 81 mg by mouth daily.        . busPIRone (BUSPAR) 30 MG tablet Take 1 tablet (30 mg total) by mouth 2 (two) times daily.  180 tablet  3  . cyclobenzaprine (FLEXERIL) 10 MG tablet take 1 tablet by mouth at bedtime if needed for muscle spasm  30 tablet  1  . ezetimibe-simvastatin (VYTORIN) 10-20 MG per tablet Take 1 tablet by mouth at bedtime.  90 tablet  2  . fexofenadine (ALLEGRA) 60 MG tablet Take 60 mg by mouth daily as needed. For allergies       . fluticasone (FLONASE) 50 MCG/ACT nasal spray Place 1 spray into the nose daily.  16 g  2  . HYDROcodone-acetaminophen (NORCO/VICODIN) 5-325 MG per tablet Take 1 tablet by mouth every 8 (eight) hours as needed.  60 tablet  2  . isosorbide mononitrate (IMDUR) 60 MG 24 hr tablet Take 1 tablet (60 mg total) by mouth daily.  90 tablet  2  . metoprolol (LOPRESSOR) 100 MG tablet take 1 tablet by mouth twice a day  120 tablet  2  . nitroGLYCERIN (NITROSTAT) 0.4 MG SL tablet Place 1 tablet (0.4 mg total) under the tongue every 5 (five) minutes as needed. 1 tab SL every 5 minutes up to 3 doses as needed for severe chest pain  30 tablet  1  .  omeprazole (PRILOSEC) 20 MG capsule Take 1 capsule (20 mg total) by mouth daily.  90 capsule  1  . [DISCONTINUED] HYDROcodone-acetaminophen (NORCO) 5-325 MG per tablet Take 1 tablet by mouth every 8 (eight) hours as needed.  60 tablet  1    Exam:  BP 121/76  Pulse 69  Temp 98.7 F (37.1 C) (Oral)  Ht 6' (1.829 m)  Wt 199 lb (90.266 kg)  BMI 26.99 kg/m2 Gen: Well NAD HEENT: EOMI,  MMM Lungs: CTABL Nl WOB Heart: RRR no MRG Abd: NABS, NT, ND Exts: Non edematous BL  LE, warm and well perfused.   Back - Normal skin, Spine with normal alignment and no deformity.  No tenderness to vertebral process palpation.  Paraspinous muscles are not tender and without spasm.   Range of motion is full at neck and  lumbar sacral regions   No results found for this or any previous visit (from the past 72 hour(s)).

## 2012-08-21 NOTE — Addendum Note (Signed)
Addended byGwendolyn Grant, Newt Lukes on: 08/21/2012 09:27 AM   Modules accepted: Orders

## 2012-08-24 ENCOUNTER — Ambulatory Visit: Payer: Self-pay | Admitting: Family Medicine

## 2012-09-11 ENCOUNTER — Encounter: Payer: Self-pay | Admitting: Family Medicine

## 2012-09-11 ENCOUNTER — Ambulatory Visit (INDEPENDENT_AMBULATORY_CARE_PROVIDER_SITE_OTHER): Payer: Medicare Other | Admitting: Family Medicine

## 2012-09-11 VITALS — BP 126/81 | HR 98 | Temp 98.6°F | Ht 72.0 in | Wt 196.0 lb

## 2012-09-11 DIAGNOSIS — J069 Acute upper respiratory infection, unspecified: Secondary | ICD-10-CM | POA: Insufficient documentation

## 2012-09-11 MED ORDER — HYDROCODONE-HOMATROPINE 5-1.5 MG/5ML PO SYRP: 5.0000 mL | ORAL_SOLUTION | Freq: Three times a day (TID) | ORAL | Status: DC | PRN

## 2012-09-11 NOTE — Progress Notes (Signed)
S: Pt comes in today for SDA for ?flu.  Patient reports tickle in his throat Christmas day.  Started having achiness and cold chills Christmas night (2 nights ago).  Has not taken his temp but is pretty sure he has had at least a low grade fever.  +congestion, + cough.  Stomach and ribs are sore when he coughs.  No sick contacts.  No N/V/D.  No ear pain.  No flu shot this year.  Has tried Zicam cough drops and day time cold medicine.     ROS: Per HPI  History  Smoking status  . Never Smoker   Smokeless tobacco  . Not on file    O:  Filed Vitals:   09/11/12 1343  BP: 126/81  Pulse: 98  Temp: 98.6 F (37 C)    Gen: NAD HEENT: MMM, EOMI, PERRLA, no scleral injection, no pharyngeal erythema or exudate, no cervical LAD, nasal mucosa normal CV: RRR, no murmur Pulm: CTA bilat, no wheezes or crackles   A/P: 56 y.o. male p/w viral URI -See problem list -f/u in PRN

## 2012-09-11 NOTE — Assessment & Plan Note (Signed)
Likely viral URI, cannot rule out flu but discussed pro's and con's of tamiflu with pt and opted for symptomatic treatment.  Hycodan Rx'ed.  F/u if not improving or signs of 2nd sickening for possible bacterial transformation.  Red flags for return discussed.

## 2012-09-11 NOTE — Patient Instructions (Signed)
It was nice to meet you today.  I am giving you a prescription for a cough medicine.  It may make you sleepy so don't drive until after you have taken a few doses of it.  I would also recommend things like nasal saline rinses, tea with honey, and lots of fluids.  You can use over the counter medicines like DayQuil and NyQuil for a few days as well as mucinex.  Try to limit the amount of pseudoephedrine you use because of your blood pressure.   Come back if you are still having symptoms in 10-14 days.    Upper Respiratory Infection, Adult An upper respiratory infection (URI) is also known as the common cold. It is often caused by a type of germ (virus). Colds are easily spread (contagious). You can pass it to others by kissing, coughing, sneezing, or drinking out of the same glass. Usually, you get better in 1 or 2 weeks.  HOME CARE   Only take medicine as told by your doctor.  Use a warm mist humidifier or breathe in steam from a hot shower.  Drink enough water and fluids to keep your pee (urine) clear or pale yellow.  Get plenty of rest.  Return to work when your temperature is back to normal or as told by your doctor. You may use a face mask and wash your hands to stop your cold from spreading. GET HELP RIGHT AWAY IF:   After the first few days, you feel you are getting worse.  You have questions about your medicine.  You have chills, shortness of breath, or brown or red spit (mucus).  You have yellow or brown snot (nasal discharge) or pain in the face, especially when you bend forward.  You have a fever, puffy (swollen) neck, pain when you swallow, or white spots in the back of your throat.  You have a bad headache, ear pain, sinus pain, or chest pain.  You have a high-pitched whistling sound when you breathe in and out (wheezing).  You have a lasting cough or cough up blood.  You have sore muscles or a stiff neck. MAKE SURE YOU:   Understand these instructions.  Will  watch your condition.  Will get help right away if you are not doing well or get worse. Document Released: 02/19/2008 Document Revised: 11/25/2011 Document Reviewed: 01/07/2011 [facility] Patient Information 2013 [place], [place].

## 2012-12-24 ENCOUNTER — Encounter: Payer: Self-pay | Admitting: Family Medicine

## 2012-12-24 ENCOUNTER — Ambulatory Visit (INDEPENDENT_AMBULATORY_CARE_PROVIDER_SITE_OTHER): Payer: Medicare Other | Admitting: Family Medicine

## 2012-12-24 VITALS — BP 124/75 | HR 75 | Ht 72.0 in | Wt 204.4 lb

## 2012-12-24 DIAGNOSIS — M109 Gout, unspecified: Secondary | ICD-10-CM

## 2012-12-24 DIAGNOSIS — F1010 Alcohol abuse, uncomplicated: Secondary | ICD-10-CM

## 2012-12-24 MED ORDER — COLCHICINE 0.6 MG PO TABS: 0.6000 mg | ORAL_TABLET | Freq: Two times a day (BID) | ORAL | Status: DC

## 2012-12-24 NOTE — Assessment & Plan Note (Signed)
New problem for patient. Plan to treat with Colchicine, see instructions for administration. FU with me in 2-3 weeks to ensure improvement and to go over other chronic medical issues.   FU sooner if no improvement with colchicine.

## 2012-12-24 NOTE — Progress Notes (Signed)
Subjective:    Corey KLEM Sr. is a 57 y.o. male who presents to [facility] today with complaints of right great toe pain:  1.  right great toe pain: Present for the past 3 days. Started Sunday night and became severe pain in the morning. He has had difficulty ambulating secondary to the pain. He is on chronic hydrocodone but states this is not relieve his pain. He has trouble sleeping at night secondary to the pain. It hurts when he puts on his sock. The base of his right great toe is swollen and red. He does not have a personal or family history of gout. He is decreased from drinking 12-13 beers a day to 6-7 beers a day.   The following portions of the patient's history were reviewed and updated as appropriate: allergies, current medications, past medical history, family and social history, and problem list. Patient is a nonsmoker.    PMH reviewed.  No past medical history on file. No past surgical history on file.  Medications reviewed. Current Outpatient Prescriptions  Medication Sig Dispense Refill  . amitriptyline (ELAVIL) 50 MG tablet Take 1 tablet (50 mg total) by mouth at bedtime as needed for sleep.  90 tablet  2  . aspirin 81 MG EC tablet Take 81 mg by mouth daily.        . busPIRone (BUSPAR) 30 MG tablet Take 1 tablet (30 mg total) by mouth 2 (two) times daily.  180 tablet  3  . colchicine 0.6 MG tablet Take 1 tablet (0.6 mg total) by mouth 2 (two) times daily.  30 tablet  1  . cyclobenzaprine (FLEXERIL) 10 MG tablet Take 1 tablet (10 mg total) by mouth 2 (two) times daily as needed for muscle spasms.  30 tablet  3  . ezetimibe-simvastatin (VYTORIN) 10-20 MG per tablet Take 1 tablet by mouth at bedtime.  90 tablet  2  . fexofenadine (ALLEGRA) 60 MG tablet Take 1 tablet (60 mg total) by mouth daily as needed. For allergies  30 tablet  3  . fluticasone (FLONASE) 50 MCG/ACT nasal spray Place 1 spray into the nose daily.  16 g  2  . HYDROcodone-acetaminophen (NORCO/VICODIN) 5-325 MG per  tablet Take 1 tablet by mouth every 8 (eight) hours as needed.  60 tablet  2  . HYDROcodone-homatropine (HYCODAN) 5-1.5 MG/5ML syrup Take 5 mLs by mouth every 8 (eight) hours as needed for cough.  120 mL  0  . isosorbide mononitrate (IMDUR) 60 MG 24 hr tablet Take 1 tablet (60 mg total) by mouth daily.  90 tablet  2  . metoprolol (LOPRESSOR) 100 MG tablet Take 1 tablet (100 mg total) by mouth 2 (two) times daily.  120 tablet  2  . nitroGLYCERIN (NITROSTAT) 0.4 MG SL tablet Place 1 tablet (0.4 mg total) under the tongue every 5 (five) minutes as needed. Up to 3 doses.  30 tablet  1  . omeprazole (PRILOSEC) 20 MG capsule Take 1 capsule (20 mg total) by mouth daily.  90 capsule  2   No current facility-administered medications for this visit.    ROS as above otherwise neg.  No chest pain, palpitations, SOB, Fever, Chills, Abd pain, N/V/D.   Objective:   Physical Exam BP 124/75  Pulse 75  Ht 6' (1.829 m)  Wt 204 lb 6.4 oz (92.715 kg)  BMI 27.72 kg/m2 Gen:  Alert, cooperative patient who appears stated age in no acute distress.  Vital signs reviewed. Ext: Left foot within  normal limits. Right toe with edema and erythema first MTP joint. Tender to touch here. He cannot wiggle his great toe secondary to pain. Otherwise the rest of his foot and ankle are within normal limits and nontender/without erythema or swelling  No results found for this or any previous visit (from the past 72 hour(s)).

## 2012-12-24 NOTE — Assessment & Plan Note (Signed)
Discussed how this is likely trigger for his gout. He seems more open to cutting back even further on drinking. Will re-address at next visit

## 2012-12-24 NOTE — Patient Instructions (Addendum)
Take 2 pills and follow by another pill 1 hour later.  Take 3 times a day as needed after this until the pain decreases.  Once pain decreases, take twice a day for the next several days.  You can stop when the pain is gone.    Come back in 2-3 weeks for a follow up.    This should start feeling better

## 2013-02-09 ENCOUNTER — Ambulatory Visit (INDEPENDENT_AMBULATORY_CARE_PROVIDER_SITE_OTHER): Payer: Medicare Other | Admitting: Family Medicine

## 2013-02-09 ENCOUNTER — Encounter: Payer: Self-pay | Admitting: Family Medicine

## 2013-02-09 VITALS — BP 112/77 | HR 67 | Temp 98.3°F | Ht 72.0 in | Wt 205.0 lb

## 2013-02-09 DIAGNOSIS — F1010 Alcohol abuse, uncomplicated: Secondary | ICD-10-CM

## 2013-02-09 DIAGNOSIS — M549 Dorsalgia, unspecified: Secondary | ICD-10-CM

## 2013-02-09 DIAGNOSIS — M79641 Pain in right hand: Secondary | ICD-10-CM

## 2013-02-09 DIAGNOSIS — M109 Gout, unspecified: Secondary | ICD-10-CM

## 2013-02-09 DIAGNOSIS — M79609 Pain in unspecified limb: Secondary | ICD-10-CM

## 2013-02-09 DIAGNOSIS — R030 Elevated blood-pressure reading, without diagnosis of hypertension: Secondary | ICD-10-CM

## 2013-02-09 DIAGNOSIS — E785 Hyperlipidemia, unspecified: Secondary | ICD-10-CM

## 2013-02-09 LAB — COMPREHENSIVE METABOLIC PANEL
ALT: 28 U/L (ref 0–53)
AST: 32 U/L (ref 0–37)
Chloride: 102 mEq/L (ref 96–112)
Creat: 0.92 mg/dL (ref 0.50–1.35)
Total Bilirubin: 0.4 mg/dL (ref 0.3–1.2)

## 2013-02-09 LAB — CBC
HCT: 38.6 % — ABNORMAL LOW (ref 39.0–52.0)
MCH: 31.8 pg (ref 26.0–34.0)
MCHC: 33.9 g/dL (ref 30.0–36.0)
MCV: 93.7 fL (ref 78.0–100.0)
RDW: 15.1 % (ref 11.5–15.5)

## 2013-02-09 LAB — LIPID PANEL: LDL Cholesterol: 26 mg/dL (ref 0–99)

## 2013-02-09 MED ORDER — HYDROCODONE-ACETAMINOPHEN 5-325 MG PO TABS: 1.0000 | ORAL_TABLET | Freq: Three times a day (TID) | ORAL | Status: DC | PRN

## 2013-02-09 NOTE — Patient Instructions (Signed)
Checking labs today, I'll let you know about your wrist.

## 2013-02-09 NOTE — Progress Notes (Signed)
Subjective:    Corey GATSON Sr. is a 57 y.o. male who presents to [facility] today with several concerns:  1.  Right Hand pain:  Right hand pain and swelling, present for past several months.  Worse in AM, swollen to point he needs to run warm water over it to help "loosen it up."  No swelling during rest of day.  Occasionally becomes "tight" when repetitive motion.  He is right handed.  No trauma or injury to hand/fingers.  Occasionally notices it "turning blue" in hand but not fingers.  No real pain in this hand.   2.  Chronic back pain:  Stemming from surgery years ago.  Asking for refill of his hydrocodone, which he has taken for years.  Allows him to complete ADLs, unable to accomplish these without hydrocodone.  Also with chronic flexeril usage.     The following portions of the patient's history were reviewed and updated as appropriate: allergies, current medications, past medical history, family and social history, and problem list. Patient is a nonsmoker.    PMH reviewed.  No past medical history on file. No past surgical history on file.  Medications reviewed. Current Outpatient Prescriptions  Medication Sig Dispense Refill  . amitriptyline (ELAVIL) 50 MG tablet Take 1 tablet (50 mg total) by mouth at bedtime as needed for sleep.  90 tablet  2  . aspirin 81 MG EC tablet Take 81 mg by mouth daily.        . busPIRone (BUSPAR) 30 MG tablet Take 1 tablet (30 mg total) by mouth 2 (two) times daily.  180 tablet  3  . colchicine 0.6 MG tablet Take 1 tablet (0.6 mg total) by mouth 2 (two) times daily.  30 tablet  1  . cyclobenzaprine (FLEXERIL) 10 MG tablet Take 1 tablet (10 mg total) by mouth 2 (two) times daily as needed for muscle spasms.  30 tablet  3  . ezetimibe-simvastatin (VYTORIN) 10-20 MG per tablet Take 1 tablet by mouth at bedtime.  90 tablet  2  . fexofenadine (ALLEGRA) 60 MG tablet Take 1 tablet (60 mg total) by mouth daily as needed. For allergies  30 tablet  3  .  HYDROcodone-acetaminophen (NORCO/VICODIN) 5-325 MG per tablet Take 1 tablet by mouth every 8 (eight) hours as needed.  60 tablet  2  . isosorbide mononitrate (IMDUR) 60 MG 24 hr tablet Take 1 tablet (60 mg total) by mouth daily.  90 tablet  2  . metoprolol (LOPRESSOR) 100 MG tablet Take 1 tablet (100 mg total) by mouth 2 (two) times daily.  120 tablet  2  . nitroGLYCERIN (NITROSTAT) 0.4 MG SL tablet Place 1 tablet (0.4 mg total) under the tongue every 5 (five) minutes as needed. Up to 3 doses.  30 tablet  1  . omeprazole (PRILOSEC) 20 MG capsule Take 1 capsule (20 mg total) by mouth daily.  90 capsule  2   No current facility-administered medications for this visit.    ROS as above otherwise neg.  No chest pain, palpitations, SOB, Fever, Chills, Abd pain, N/V/D.   Objective:   Physical Exam BP 112/77  Pulse 67  Temp(Src) 98.3 F (36.8 C) (Oral)  Ht 6' (1.829 m)  Wt 205 lb (92.987 kg)  BMI 27.8 kg/m2 Gen:  Alert, cooperative patient who appears stated age in no acute distress.  Vital signs reviewed. HEENT: EOMI,  MMM Cardiac:  Regular rate and rhythm without murmur auscultated.  Good S1/S2. Pulm:  Clear to  auscultation bilaterally with good air movement.  No wheezes or rales noted.   Abd:  Soft/nondistended/nontender.  Good bowel sounds throughout all four quadrants.  No masses noted.  Exts: Left hand WNL.  Right hand with decreased capillary refill and residual paleness noted all 4 fingers but not thumb distal to PIP joints after testing handgrip (which is 5/5 BL).  No tenderness or swelling noted Right hand upon palpation.  No bruising noted.  No tenderness to wrist.   Back:  Nontender currently.    No results found for this or any previous visit (from the past 72 hour(s)).

## 2013-02-10 ENCOUNTER — Encounter: Payer: Self-pay | Admitting: Family Medicine

## 2013-02-10 DIAGNOSIS — M79641 Pain in right hand: Secondary | ICD-10-CM | POA: Insufficient documentation

## 2013-02-10 NOTE — Assessment & Plan Note (Signed)
Refill for hydrocodone today. As before, no concerns for abuse or diversion. Helps him with ADLs.  FU in 6 months.

## 2013-02-10 NOTE — Assessment & Plan Note (Signed)
Pre-contemplative today.  Patient encouraged by fact he has cut down from 13 - 7 beers daily at start of new year.   Did have 5 day period of no beer consumption at all during URI in December 2013, but this is only day he has ever gone without drinking. No desire to cut back further, does not see this as an issue or any reason to cut back.

## 2013-02-10 NOTE — Assessment & Plan Note (Signed)
Unclear etiology. Gout a possibility in this chronic alcoholic.  Has had recent podagra.  Checking uric acid today.   Raynaud's also possibility, though not consistent with hand pain.  He did have decreased refill and paleness to his fingers R>L upon handgrip testing.   Await lab results for uric acid. FU if no improvement.

## 2013-02-14 DEATH — deceased

## 2013-02-15 ENCOUNTER — Encounter: Payer: Self-pay | Admitting: Family Medicine

## 2013-04-05 ENCOUNTER — Other Ambulatory Visit: Payer: Self-pay | Admitting: Family Medicine

## 2013-05-17 ENCOUNTER — Other Ambulatory Visit: Payer: Self-pay | Admitting: Family Medicine

## 2013-06-03 ENCOUNTER — Telehealth: Payer: Self-pay | Admitting: Family Medicine

## 2013-06-03 ENCOUNTER — Other Ambulatory Visit: Payer: Self-pay | Admitting: Family Medicine

## 2013-06-03 NOTE — Telephone Encounter (Signed)
Pt called and would like a refill of metoprolol called into the pharmacy. JW

## 2013-06-04 MED ORDER — METOPROLOL TARTRATE 100 MG PO TABS: ORAL_TABLET | ORAL | Status: DC

## 2013-06-22 ENCOUNTER — Other Ambulatory Visit: Payer: Self-pay | Admitting: Family Medicine

## 2013-06-24 NOTE — Telephone Encounter (Signed)
Pt says he only has one pill left

## 2013-06-25 ENCOUNTER — Telehealth: Payer: Self-pay | Admitting: Family Medicine

## 2013-06-25 NOTE — Telephone Encounter (Signed)
Refill sent.

## 2013-06-25 NOTE — Telephone Encounter (Signed)
Pt called because he only 1 pill left of his Vytorin and really needs this called asap. JW

## 2013-06-28 ENCOUNTER — Other Ambulatory Visit: Payer: Self-pay | Admitting: Family Medicine

## 2013-06-29 ENCOUNTER — Ambulatory Visit (INDEPENDENT_AMBULATORY_CARE_PROVIDER_SITE_OTHER): Payer: Medicare Other | Admitting: Family Medicine

## 2013-06-29 VITALS — BP 115/69 | HR 68 | Temp 98.4°F | Ht 72.0 in | Wt 210.0 lb

## 2013-06-29 DIAGNOSIS — I10 Essential (primary) hypertension: Secondary | ICD-10-CM

## 2013-06-29 DIAGNOSIS — F1010 Alcohol abuse, uncomplicated: Secondary | ICD-10-CM

## 2013-06-29 DIAGNOSIS — M549 Dorsalgia, unspecified: Secondary | ICD-10-CM

## 2013-06-29 DIAGNOSIS — M25519 Pain in unspecified shoulder: Secondary | ICD-10-CM

## 2013-06-29 DIAGNOSIS — Z23 Encounter for immunization: Secondary | ICD-10-CM

## 2013-06-29 DIAGNOSIS — M25512 Pain in left shoulder: Secondary | ICD-10-CM

## 2013-06-29 DIAGNOSIS — G4700 Insomnia, unspecified: Secondary | ICD-10-CM

## 2013-06-29 MED ORDER — TRAZODONE HCL 50 MG PO TABS: 25.0000 mg | ORAL_TABLET | Freq: Every evening | ORAL | Status: DC | PRN

## 2013-06-29 MED ORDER — HYDROCODONE-ACETAMINOPHEN 5-325 MG PO TABS: 1.0000 | ORAL_TABLET | Freq: Four times a day (QID) | ORAL | Status: DC | PRN

## 2013-06-29 MED ORDER — HYDROCODONE-ACETAMINOPHEN 5-325 MG PO TABS: 1.0000 | ORAL_TABLET | Freq: Three times a day (TID) | ORAL | Status: DC | PRN

## 2013-06-29 MED ORDER — EZETIMIBE-SIMVASTATIN 10-20 MG PO TABS: ORAL_TABLET | ORAL | Status: DC

## 2013-06-29 NOTE — Patient Instructions (Signed)
Refill for your medicines today.  Let me know if your shoulder's still hurting in 2-3 weeks.    Take 600 mg (3 pills of Ibuprofen) 2-3 times a day.    Shoulder Rehab The acromioclavicular joint is the joint between the roof of the shoulder (acromion) and the collarbone (clavicle). It is vulnerable to injury. An acromioclavicular [facility]) separation is a partial or complete tear (sprain), injury, or redness and soreness (inflammation) of the ligaments that cross the acromioclavicular joint and hold it in place. There are two ligaments in this area that are vulnerable to injury, the acromioclavicular ligament and the coracoclavicular ligament. SYMPTOMS   Tenderness and swelling, or a bump on top of the shoulder (at the [facility] joint).  Bruising (contusion) in the area within 48 hours of injury.  Loss of strength or pain when reaching over the head or across the body. CAUSES  AC separation is caused by direct trauma to the joint (falling on your shoulder) or indirect trauma (falling on an outstretched arm). RISK INCREASES WITH:  Sports that require contact or collision, throwing sports (i.e. racquetball, squash).  Poor strength and flexibility.  Previous shoulder sprain or dislocation.  Poorly fitted or padded protective equipment. PREVENTION   Warm-up and stretch properly before activity.  Maintain physical fitness:  Shoulder strength.  Shoulder flexibility.  Cardiovascular fitness.  Wear properly fitted and padded protective equipment.  Learn and use proper technique when playing sports. Have a coach correct improper technique, including falling and landing.  Apply taping, protective strapping or padding, or an adhesive bandage as recommended before practice or competition. PROGNOSIS   If treated properly, the symptoms of AC separation can be expected to go away.  If treated improperly, permanent disability may occur unless surgery is performed.  Healing time varies with type  of sport and position, arm injured (dominant versus non-dominant) and severity of sprain. RELATED COMPLICATIONS  Weakness and fatigue of the arm or shoulder are possible but uncommon.  Pain and inflammation of the [facility] joint may continue.  Prolonged healing time may be necessary if usual activities are resumed too early. This causes a susceptibility to recurrent injury.  Prolonged disability may occur.  The shoulder may remain unstable or arthritic following repeated injury. TREATMENT  Treatment initially involves ice and medication to help reduce pain and inflammation. It may also be necessary to modify your activities in order to prevent further injury. Both non-surgical and surgical interventions exist to treat AC separation. Non-surgical intervention is usually recommended and involves wearing a sling to immobilize the joint for a period of time to allow for healing. Surgical intervention is usually only considered for severe sprains of the ligament or for individuals who do not improve after 2 to 6 months of non-surgical treatment. Surgical interventions require 4 to 6 months before a return to sports is possible. MEDICATION  If pain medication is necessary, nonsteroidal anti-inflammatory medications, such as aspirin and ibuprofen, or other minor pain relievers, such as acetaminophen, are often recommended.  Do not take pain medication for 7 days before surgery.  Prescription pain relievers may be given by your caregiver. Use only as directed and only as much as you need.  Ointments applied to the skin may be helpful.  Corticosteroid injections may be given to reduce inflammation. HEAT AND COLD  Cold treatment (icing) relieves pain and reduces inflammation. Cold treatment should be applied for 10 to 15 minutes every 2 to 3 hours for inflammation and pain and immediately after any activity  that aggravates your symptoms. Use ice packs or an ice massage.  Heat treatment may be used  prior to performing the stretching and strengthening activities prescribed by your caregiver, physical therapist or athletic trainer. Use a heat pack or a warm soak. SEEK IMMEDIATE MEDICAL CARE IF:   Pain, swelling or bruising worsens despite treatment.  There is pain, numbness or coldness in the arm.  Discoloration appears in the fingernails.  New, unexplained symptoms develop. EXERCISES  RANGE OF MOTION (ROM) AND STRETCHING EXERCISES  Acromioclavicular Separation These exercises may help you when beginning to rehabilitate your injury. Your symptoms may resolve with or without further involvement from your physician, physical therapist or athletic trainer. While completing these exercises, remember:  Restoring tissue flexibility helps normal motion to return to the joints. This allows healthier, less painful movement and activity.  An effective stretch should be held for at least 30 seconds.  A stretch should never be painful. You should only feel a gentle lengthening or release in the stretched tissue. ROM Pendulum  Bend at the waist so that your right / left arm falls away from your body. Support yourself with your opposite hand on a solid surface, such as a table or a countertop.  Your right / left arm should be perpendicular to the ground. If it is not perpendicular, you need to lean over farther. Relax the muscles in your right / left arm and shoulder as much as possible.  Gently sway your hips and trunk so they move your right / left arm without any use of your right / left shoulder muscles.  Progress your movements so that your right / left arm moves side to side, then forward and backward, and finally, both clockwise and counterclockwise.  Complete __________ repetitions in each direction. Many people use this exercise to relieve discomfort in their shoulder as well as to gain range of motion. Repeat __________ times. Complete this exercise __________ times per day. STRETCH   Flexion, Seated   Sit in a firm chair so that your right / left forearm can rest on a table or countertop. Your right / left elbow should rest below the height of your shoulder so that your shoulder feels supported and not tense or uncomfortable.  Keeping your right / left shoulder relaxed, lean forward at your waist, allowing your right / left hand to slide forward. Bend forward until you feel a moderate stretch in your shoulder, but before you feel an increase in your pain.  Hold __________ seconds. Slowly return to your starting position. Repeat __________ times. Complete this exercise __________ times per day. STRETCH  Flexion, Standing  Stand with good posture. With an underhand grip on your right / left and an overhand grip on the opposite hand, grasp a broomstick or cane so that your hands are a little more than shoulder-width apart.  Keeping your right / left elbow straight and shoulder muscles relaxed, push the stick with your opposite hand to raise your right / left arm in front of your body and then overhead. Raise your arm until you feel a stretch in your right / left shoulder, but before you have increased shoulder pain.  Try to avoid shrugging your right / left shoulder as your arm rises by keeping your shoulder blade tucked down and toward your mid-back spine. Hold __________ seconds.  Slowly return to the starting position. Repeat __________ times. Complete this exercise __________ times per day. STRENGTHENING EXERCISES  Acromioclavicular Separation These exercises may help you  when beginning to rehabilitate your injury. They may resolve your symptoms with or without further involvement from your physician, physical therapist or athletic trainer. While completing these exercises, remember:  Muscles can gain both the endurance and the strength needed for everyday activities through controlled exercises.  Complete these exercises as instructed by your physician, physical  therapist or athletic trainer. Progress the resistance and repetitions only as guided.  You may experience muscle soreness or fatigue, but the pain or discomfort you are trying to eliminate should never worsen during these exercises. If this pain does worsen, stop and make certain you are following the directions exactly. If the pain is still present after adjustments, discontinue the exercise until you can discuss the trouble with your clinician. STRENGTH Shoulder Abductors, Isometric   With good posture, stand or sit about 4-6 inches from a wall with your right / left side facing the wall.  Bend your right / left elbow. Gently press your right / left elbow into the wall. Increase the pressure gradually until you are pressing as hard as you can without shrugging your shoulder or increasing any shoulder discomfort.  Hold __________ seconds.  Release the tension slowly. Relax your shoulder muscles completely before you start the next repetition. Repeat __________ times. Complete this exercise __________ times per day. STRENGTH  Internal Rotators, Isometric  Keep your right / left elbow at your side and bend it 90 degrees.  Step into a door frame so that the inside of your right / left wrist can press against the door frame without your upper arm leaving your side.  Gently press your right / left wrist into the door frame as if you were trying to draw the palm of your hand to your abdomen. Gradually increase the tension until you are pressing as hard as you can without shrugging your shoulder or increasing any shoulder discomfort.  Hold __________ seconds.  Release the tension slowly. Relax your shoulder muscles completely before you the next repetition. Repeat __________ times. Complete this exercise __________ times per day.  STRENGTH  External Rotators, Isometric  Keep your right / left elbow at your side and bend it 90 degrees.  Step into a door frame so that the outside of your right  / left wrist can press against the door frame without your upper arm leaving your side.  Gently press your right / left wrist into the door frame as if you were trying to swing the back of your hand away from your abdomen. Gradually increase the tension until you are pressing as hard as you can without shrugging your shoulder or increasing any shoulder discomfort.  Hold __________ seconds.  Release the tension slowly. Relax your shoulder muscles completely before you the next repetition. Repeat __________ times. Complete this exercise __________ times per day. STRENGTH  Internal Rotators  Secure a rubber exercise band/tubing to a fixed object so that it is at the same height as your right / left elbow when you are standing or sitting on a firm surface.  Stand or sit so that the secured exercise band/tubing is at your right / left side.  Bend your elbow 90 degrees. Place a folded towel or small pillow under your right / left arm so that your elbow is a few inches away from your side.  Keeping the tension on the exercise band/tubing, pull it across your body toward your abdomen. Be sure to keep your body steady so that the movement is only coming from your shoulder  rotating.  Hold __________ seconds. Release the tension in a controlled manner as you return to the starting position. Repeat __________ times. Complete this exercise __________ times per day. STRENGTH  External Rotators  Secure a rubber exercise band/tubing to a fixed object so that it is at the same height as your right / left elbow when you are standing or sitting on a firm surface.  Stand or sit so that the secured exercise band/tubing is at your side that is not injured.  Bend your elbow 90 degrees. Place a folded towel or small pillow under your right / left arm so that your elbow is a few inches away from your side.  Keeping the tension on the exercise band/tubing, pull it away from your body, as if pivoting on your elbow.  Be sure to keep your body steady so that the movement is only coming from your shoulder rotating.  Hold __________ seconds. Release the tension in a controlled manner as you return to the starting position. Repeat __________ times. Complete this exercise __________ times per day. Document Released: 09/02/2005 Document Revised: 11/25/2011 Document Reviewed: 12/15/2008 [facility] Patient Information 2014 [place], [place].

## 2013-06-29 NOTE — Progress Notes (Signed)
Subjective:    Corey HISAW Sr. is a 57 y.o. male who presents to [facility] today for several issues:  1.  Left shoulder pain: Acute issue present for less than a week. Patient experiences pain anterior aspect of left shoulder when he sleeps On that side or trying to lift his arm over his head. No history of similar pain in the past. No injury to his shoulder previously. No swelling of his arm. No redness. States "this is not feel like out."  2.  Chronic back pain:  Chronic musculoskeletal low back pain. He has been on hydrocodone for greater than 5 years for this. Refill of this for today. States that this helps him with his activities of daily living.  3.  Insomnia:  Bothering him for years, but hasn't brought up in past.  States can fall asleep for a few hours and then he's up tossing and turning most of the night.  He like something to help him sleep through the night. Denies any racing thoughts, anxiety, depressive symptoms.   The following portions of the patient's history were reviewed and updated as appropriate: allergies, current medications, past medical history, family and social history, and problem list. Patient is a nonsmoker.    PMH reviewed.  Past Medical History  Diagnosis Date  . GERD (gastroesophageal reflux disease)   . Allergy     Allergic rhinitis  . Chronic back pain     Stemming from prior lumbar diskectomy  . Obesity   . Hyperlipidemia   . Hypertension   . Substance abuse     Known chronic alcoholic - not interested in quitting (current as of 2014)  . CAD (coronary artery disease) 04/2005    Mild obstructive ramus intermedius disease (30-50%) and 50-60% proximal  . Angina pectoris 2006    Known coronary vasospasm -- treated with combination of long-acting nitrates and calcium channel blockers; NTG when necessary   Past Surgical History  Procedure Laterality Date  . Colonoscopy w/ biopsies  2011    Multiple small polyps and s/p polypectomy -- recommended repeat  in 2 years due to multiple small polyps  . Cardiovascular stress test  2005    Dr. Verdis Prime -- Cardiolite stress test showed no evidence of ischemia  . Esophageal dilation  1997  . Back surgery  1989, 1997    Chronic back pain stemming from this    Medications reviewed. Current Outpatient Prescriptions  Medication Sig Dispense Refill  . amitriptyline (ELAVIL) 50 MG tablet Take 1 tablet (50 mg total) by mouth at bedtime as needed for sleep.  90 tablet  2  . aspirin 81 MG EC tablet Take 81 mg by mouth daily.        . busPIRone (BUSPAR) 30 MG tablet Take 1 tablet (30 mg total) by mouth 2 (two) times daily.  180 tablet  3  . colchicine 0.6 MG tablet Take 1 tablet (0.6 mg total) by mouth 2 (two) times daily.  30 tablet  1  . cyclobenzaprine (FLEXERIL) 10 MG tablet Take 1 tablet (10 mg total) by mouth 2 (two) times daily as needed for muscle spasms.  30 tablet  3  . fexofenadine (ALLEGRA) 60 MG tablet Take 1 tablet (60 mg total) by mouth daily as needed. For allergies  30 tablet  3  . HYDROcodone-acetaminophen (NORCO/VICODIN) 5-325 MG per tablet Take 1 tablet by mouth every 8 (eight) hours as needed.  60 tablet  2  . isosorbide mononitrate (IMDUR) 60 MG  24 hr tablet Take 1 tablet (60 mg total) by mouth daily.  90 tablet  2  . metoprolol (LOPRESSOR) 100 MG tablet take 1 tablet by mouth twice a day  120 tablet  2  . nitroGLYCERIN (NITROSTAT) 0.4 MG SL tablet Place 1 tablet (0.4 mg total) under the tongue every 5 (five) minutes as needed. Up to 3 doses.  30 tablet  1  . omeprazole (PRILOSEC) 20 MG capsule take 1 capsule by mouth once daily  90 capsule  2  . VYTORIN 10-20 MG per tablet take 1 tablet by mouth at bedtime  90 tablet  2   No current facility-administered medications for this visit.    ROS as above otherwise neg.  No chest pain, palpitations, SOB, Fever, Chills, Abd pain, N/V/D.   Objective:   Physical Exam BP 115/69  Pulse 68  Temp(Src) 98.4 F (36.9 C) (Oral)  Ht 6' (1.829  m)  Wt 210 lb (95.255 kg)  BMI 28.47 kg/m2 Gen:  Alert, cooperative patient who appears stated age in no acute distress.  Vital signs reviewed. HEENT: EOMI,  MMM.  Upper dentures in place.   Cardiac:  Regular rate and rhythm without murmur auscultated.  Good S1/S2. Pulm:  Clear to auscultation bilaterally with good air movement.  No wheezes or rales noted.   Back: Tenderness to palpation bilateral lumbar region. 4 flexion is limited after about 45 secondary to pain and lumbar region. Exts: Non edematous BL  LE Musculoskeletal: Right shoulder within normal limits. Left shoulder tender palpation along the deltoid insertion as well as bicep insertion. However strength is 5 out of 5 bicep on left side. Ears negative. No sign of impingement. External/internal rotation out of 5 strength. Lateral raise is limited in about 90 secondary to pain. Psych: Linear coherent thought process. Not depressed or anxious appearing. Neuro: No focal deficits throughout  No results found for this or any previous visit (from the past 72 hour(s)).

## 2013-06-30 DIAGNOSIS — M25512 Pain in left shoulder: Secondary | ICD-10-CM | POA: Insufficient documentation

## 2013-06-30 DIAGNOSIS — G4700 Insomnia, unspecified: Secondary | ICD-10-CM | POA: Insufficient documentation

## 2013-06-30 NOTE — Assessment & Plan Note (Signed)
New issue for him today. Seems to be deltoid strain based on physical exam and history. Ibuprofen for anti-inflammatory effects. He already is taking hydrocodone for pain relief. I provided him with rehabilitation exercises and he is to followup in 2-3 weeks and we will readdress his pain at that time.

## 2013-06-30 NOTE — Assessment & Plan Note (Signed)
Still pre-contemplative. To them I am here to like to discuss this further.

## 2013-06-30 NOTE — Assessment & Plan Note (Signed)
At goal today. No changed medications

## 2013-06-30 NOTE — Assessment & Plan Note (Signed)
Trial of trazodone today for relief.

## 2013-06-30 NOTE — Assessment & Plan Note (Signed)
Chronic. Controlled with hydrocodone. We will obtain a drug screen next visit. Provided 3 months worth of medication today

## 2013-07-22 ENCOUNTER — Other Ambulatory Visit: Payer: Self-pay | Admitting: Family Medicine

## 2013-08-04 ENCOUNTER — Other Ambulatory Visit: Payer: Self-pay | Admitting: Family Medicine

## 2013-09-16 ENCOUNTER — Other Ambulatory Visit: Payer: Self-pay | Admitting: Family Medicine

## 2013-10-25 ENCOUNTER — Other Ambulatory Visit: Payer: Self-pay | Admitting: Family Medicine

## 2013-12-01 ENCOUNTER — Other Ambulatory Visit: Payer: Self-pay | Admitting: Family Medicine

## 2013-12-07 ENCOUNTER — Encounter: Payer: Self-pay | Admitting: Family Medicine

## 2013-12-07 ENCOUNTER — Ambulatory Visit (INDEPENDENT_AMBULATORY_CARE_PROVIDER_SITE_OTHER): Payer: Medicare Other | Admitting: Family Medicine

## 2013-12-07 ENCOUNTER — Ambulatory Visit (HOSPITAL_COMMUNITY)
Admission: RE | Admit: 2013-12-07 | Discharge: 2013-12-07 | Disposition: A | Discharge status: Home / Self Care | Payer: Medicare Other | Source: Ambulatory Visit | Attending: Family Medicine | Admitting: Family Medicine

## 2013-12-07 VITALS — BP 120/82 | HR 72 | Temp 98.2°F | Ht 72.0 in | Wt 213.8 lb

## 2013-12-07 DIAGNOSIS — R079 Chest pain, unspecified: Secondary | ICD-10-CM | POA: Insufficient documentation

## 2013-12-07 DIAGNOSIS — F1010 Alcohol abuse, uncomplicated: Secondary | ICD-10-CM

## 2013-12-07 DIAGNOSIS — M549 Dorsalgia, unspecified: Secondary | ICD-10-CM

## 2013-12-07 MED ORDER — HYDROCODONE-ACETAMINOPHEN 5-325 MG PO TABS: 1.0000 | ORAL_TABLET | Freq: Three times a day (TID) | ORAL | Status: DC | PRN

## 2013-12-07 NOTE — Assessment & Plan Note (Addendum)
Refill for chronic hydrocodone today

## 2013-12-07 NOTE — Progress Notes (Signed)
Subjective:    Corey HACKMAN Sr. is a 58 y.o. male who presents to [facility] today for chest pain:  1.  Chest pain:  Started in evening.  Woke him from sleep.  Retrosternal, radiated to Left arm.  No N/V. Had been dyspneic for week prior to this.  History of chronic stable angina.  Has not needed NTG for past several months.  Took 2 NTG that evening and resolved within 30 minutes.  Has not had any further episodes of chest pain since then.  Episode of palpitations during CP.  None since then.   Still dyspneic with exertion past 1 flight of steps or grocery shopping.  Resolves with rest.    ROS as above per HPI, otherwise neg.    The following portions of the patient's history were reviewed and updated as appropriate: allergies, current medications, past medical history, family and social history, and problem list. Patient is a nonsmoker.    PMH reviewed.  Past Medical History  Diagnosis Date  . GERD (gastroesophageal reflux disease)   . Allergy     Allergic rhinitis  . Chronic back pain     Stemming from prior lumbar diskectomy  . Obesity   . Hyperlipidemia   . Hypertension   . Substance abuse     Known chronic alcoholic - not interested in quitting (current as of 2014)  . CAD (coronary artery disease) 04/2005    Mild obstructive ramus intermedius disease (30-50%) and 50-60% proximal  . Angina pectoris 2006    Known coronary vasospasm -- treated with combination of long-acting nitrates and calcium channel blockers; NTG when necessary   Past Surgical History  Procedure Laterality Date  . Colonoscopy w/ biopsies  2011    Multiple small polyps and s/p polypectomy -- recommended repeat in 2 years due to multiple small polyps  . Cardiovascular stress test  2005    Dr. Daneen Schick -- Cardiolite stress test showed no evidence of ischemia  . Esophageal dilation  1997  . Back surgery  1989, 1997    Chronic back pain stemming from this    Medications reviewed. Current Outpatient  Prescriptions  Medication Sig Dispense Refill  . amitriptyline (ELAVIL) 50 MG tablet take 1 tablet by mouth at bedtime if needed for sleep  90 tablet  2  . aspirin 81 MG EC tablet Take 81 mg by mouth daily.        . busPIRone (BUSPAR) 30 MG tablet take 1 tablet by mouth twice a day  180 tablet  3  . colchicine 0.6 MG tablet Take 1 tablet (0.6 mg total) by mouth 2 (two) times daily.  30 tablet  1  . cyclobenzaprine (FLEXERIL) 10 MG tablet take 1 tablet by mouth twice a day if needed for muscle spasm  30 tablet  1  . ezetimibe-simvastatin (VYTORIN) 10-20 MG per tablet take 1 tablet by mouth at bedtime  30 tablet  6  . fexofenadine (ALLEGRA) 60 MG tablet Take 1 tablet (60 mg total) by mouth daily as needed. For allergies  30 tablet  3  . HYDROcodone-acetaminophen (NORCO) 5-325 MG per tablet Take 1 tablet by mouth every 6 (six) hours as needed for pain. Do not fill until 30 days from this date  60 tablet  0  . HYDROcodone-acetaminophen (NORCO) 5-325 MG per tablet Take 1 tablet by mouth every 6 (six) hours as needed for pain. Do not fill until 60 days from this date  60 tablet  0  .  HYDROcodone-acetaminophen (NORCO/VICODIN) 5-325 MG per tablet Take 1 tablet by mouth every 8 (eight) hours as needed.  60 tablet  0  . isosorbide mononitrate (IMDUR) 60 MG 24 hr tablet take 1 tablet by mouth once daily  90 tablet  2  . metoprolol (LOPRESSOR) 100 MG tablet take 1 tablet by mouth twice a day  120 tablet  2  . NITROSTAT 0.4 MG SL tablet place 1 tablet under the tongue if needed every 5 minutes for chest pain FOR UP TO 3 DOSES.  25 tablet  1  . omeprazole (PRILOSEC) 20 MG capsule take 1 capsule by mouth once daily  90 capsule  2  . traZODone (DESYREL) 50 MG tablet take 1/2 to 1 tablet by mouth at bedtime if needed for sleep  30 tablet  3   No current facility-administered medications for this visit.     Objective:   Physical Exam BP 120/82  Pulse 72  Temp(Src) 98.2 F (36.8 C) (Oral)  Ht 6' (1.829 m)   Wt 213 lb 12.8 oz (96.979 kg)  BMI 28.99 kg/m2  SpO2 98% Gen:  Alert, cooperative patient who appears stated age in no acute distress.  Vital signs reviewed. HEENT: EOMI,  MMM Cardiac:  Regular rate and rhythm  Lungs:  Clear Abdomen:  Obese/NT.  No organomegaly Ext:  No LE edema BL  No results found for this or any previous visit (from the past 72 hour(s)).

## 2013-12-07 NOTE — Assessment & Plan Note (Signed)
Counseled to quit fully in light of chest pain and risk. Pt states not interested in quitting, doesn't want any further information with resources

## 2013-12-07 NOTE — Assessment & Plan Note (Addendum)
EKG performed here, NSR with only flipped T in lead V2.   [facility] Card[facility], nurse spoke with nurse for patient's cardiologist.  No need to be seen today per cardiologist as no real change in EKG and no pain x 1 week. Will urgently refer to be seen this week as this is first episode of possible worsening angina.   Has NTG on him today. Understands about need to go to ED if pain recurs -- states if that pain comes back he won't wait to go to ED.

## 2013-12-07 NOTE — Patient Instructions (Signed)
We need to get you back to see the cardiologist.    Let him evaluate you and than we can talk about changing your medications for tremors.

## 2014-01-08 ENCOUNTER — Encounter: Payer: Self-pay | Admitting: Interventional Cardiology

## 2014-01-24 ENCOUNTER — Other Ambulatory Visit: Payer: Self-pay | Admitting: Family Medicine

## 2014-01-27 ENCOUNTER — Other Ambulatory Visit: Payer: Self-pay | Admitting: Family Medicine

## 2014-01-31 ENCOUNTER — Ambulatory Visit (INDEPENDENT_AMBULATORY_CARE_PROVIDER_SITE_OTHER): Payer: Medicare Other | Admitting: Interventional Cardiology

## 2014-01-31 ENCOUNTER — Other Ambulatory Visit: Payer: Self-pay | Admitting: Family Medicine

## 2014-01-31 ENCOUNTER — Encounter: Payer: Self-pay | Admitting: Interventional Cardiology

## 2014-01-31 VITALS — BP 118/88 | HR 72 | Ht 72.0 in | Wt 210.0 lb

## 2014-01-31 DIAGNOSIS — R079 Chest pain, unspecified: Secondary | ICD-10-CM

## 2014-01-31 DIAGNOSIS — I10 Essential (primary) hypertension: Secondary | ICD-10-CM

## 2014-01-31 DIAGNOSIS — M109 Gout, unspecified: Secondary | ICD-10-CM

## 2014-01-31 DIAGNOSIS — I2510 Atherosclerotic heart disease of native coronary artery without angina pectoris: Secondary | ICD-10-CM

## 2014-01-31 NOTE — Patient Instructions (Signed)
Your physician recommends that you continue on your current medications as directed. Please refer to the Current Medication list given to you today.  Your physician recommends that you schedule a follow-up appointment in: 1 year/or as needed  

## 2014-01-31 NOTE — Progress Notes (Signed)
Patient ID: Corey Stecher., male   DOB: 12-29-1955, 58 y.o.   MRN: [id number]    [address], [address], [place]  [address] Phone: [phone] Fax:  [phone]  Date:  01/31/2014   ID:  Corey Ranch Sr., DOB May 30, 1956, MRN [id number]  PCP:  Annabell Sabal, MD   ASSESSMENT:  1. Coronary artery disease, asymptomatic 2. Recent episode of angina relieved with nitroglycerin, 2 months ago 3. Hypertension, controlled 4. Hyperlipidemia, controlled  PLAN:  1. Patient has coronary disease, nonobstructive her last evaluated. No limitations of physical activity. One episode 2 months ago relieved by nitroglycerin. 2. No specific workup is felt indicated at this time with patient's exertional tolerance. 3. No change in medical therapy   SUBJECTIVE: Corey Edgell. is a 58 y.o. male who has a history of coronary disease. Last evaluated in 2006 we have moderate disease. 2 months ago had an episode of prolonged pain lasting approximately 10 minutes with the nitroglycerin being taken about 3 minutes into the episode. The discomfort resolved and he has had no recurrence since that time. EKG is primary care doctor's office the next day was unremarkable. He is continued to work at full schedule. He is up and down stairs frequently without any limitations.   Wt Readings from Last 3 Encounters:  01/31/14 210 lb (95.255 kg)  12/07/13 213 lb 12.8 oz (96.979 kg)  06/29/13 210 lb (95.255 kg)     Past Medical History  Diagnosis Date  . GERD (gastroesophageal reflux disease)   . Allergy     Allergic rhinitis  . Chronic back pain     Stemming from prior lumbar diskectomy  . Obesity   . Hyperlipidemia   . Hypertension   . Substance abuse     Known chronic alcoholic - not interested in quitting (current as of 2014)  . CAD (coronary artery disease) 04/2005    Mild obstructive ramus intermedius disease (30-50%) and 50-60% proximal  . Angina pectoris 2006    Known coronary  vasospasm -- treated with combination of long-acting nitrates and calcium channel blockers; NTG when necessary    Current Outpatient Prescriptions  Medication Sig Dispense Refill  . amitriptyline (ELAVIL) 50 MG tablet take 1 tablet by mouth at bedtime if needed for sleep  90 tablet  2  . aspirin 81 MG EC tablet Take 81 mg by mouth daily.        . busPIRone (BUSPAR) 30 MG tablet take 1 tablet by mouth twice a day  180 tablet  3  . colchicine 0.6 MG tablet Take 1 tablet (0.6 mg total) by mouth 2 (two) times daily.  30 tablet  1  . cyclobenzaprine (FLEXERIL) 10 MG tablet take 1 tablet by mouth twice a day if needed for muscle spasm  30 tablet  1  . ezetimibe-simvastatin (VYTORIN) 10-20 MG per tablet take 1 tablet by mouth at bedtime  30 tablet  6  . fexofenadine (ALLEGRA) 60 MG tablet Take 1 tablet (60 mg total) by mouth daily as needed. For allergies  30 tablet  3  . HYDROcodone-acetaminophen (NORCO) 5-325 MG per tablet Take 1 tablet by mouth every 8 (eight) hours as needed. Do not fill until 30 days from this date  60 tablet  0  . HYDROcodone-acetaminophen (NORCO) 5-325 MG per tablet Take 1 tablet by mouth every 8 (eight) hours as needed. Do not fill until 60 days from this date  60 tablet  0  .  HYDROcodone-acetaminophen (NORCO/VICODIN) 5-325 MG per tablet Take 1 tablet by mouth every 8 (eight) hours as needed.  60 tablet  0  . isosorbide mononitrate (IMDUR) 60 MG 24 hr tablet take 1 tablet by mouth once daily  90 tablet  2  . metoprolol (LOPRESSOR) 100 MG tablet take 1 tablet by mouth twice a day  120 tablet  3  . NITROSTAT 0.4 MG SL tablet place 1 tablet under the tongue if needed every 5 minutes for chest pain FOR UP TO 3 DOSES.  25 tablet  1  . omeprazole (PRILOSEC) 20 MG capsule take 1 capsule by mouth once daily  90 capsule  2  . traZODone (DESYREL) 50 MG tablet take 1/2 to 1 tablet by mouth at bedtime if needed for sleep  30 tablet  3   No current facility-administered medications for this  visit.    Allergies:    Allergies  Allergen Reactions  . Diphenhydramine Hcl   . Zantac [Ranitidine Hcl]     Social History:  The patient  reports that he has never smoked. He does not have any smokeless tobacco history on file.   ROS:  Please see the history of present illness.   Denies dyspnea, orthopnea, PND, palpitations   All other systems reviewed and negative.   OBJECTIVE: VS:  BP 118/88  Pulse 72  Ht 6' (1.829 m)  Wt 210 lb (95.255 kg)  BMI 28.47 kg/m2 Well nourished, well developed, in no acute distress, obese HEENT: normal Neck: JVD flat. Carotid bruit absent  Cardiac:  normal S1, S2; RRR; no murmur Lungs:  clear to auscultation bilaterally, no wheezing, rhonchi or rales Abd: soft, nontender, no hepatomegaly Ext: Edema absent. Pulses 2+ and symmetric Skin: warm and dry Neuro:  CNs 2-12 intact, no focal abnormalities noted  EKG:  Sinus bradycardia with B6 missing on EKG done March 2050       Signed, [place], MD 01/31/2014 9:34 AM

## 2014-02-17 ENCOUNTER — Other Ambulatory Visit: Payer: Self-pay | Admitting: Family Medicine

## 2014-04-15 ENCOUNTER — Other Ambulatory Visit: Payer: Self-pay | Admitting: Family Medicine

## 2014-04-15 DIAGNOSIS — M549 Dorsalgia, unspecified: Secondary | ICD-10-CM

## 2014-04-15 MED ORDER — HYDROCODONE-ACETAMINOPHEN 5-325 MG PO TABS: 1.0000 | ORAL_TABLET | Freq: Three times a day (TID) | ORAL | Status: DC | PRN

## 2014-04-24 ENCOUNTER — Other Ambulatory Visit: Payer: Self-pay | Admitting: Family Medicine

## 2014-04-26 ENCOUNTER — Other Ambulatory Visit: Payer: Self-pay | Admitting: Family Medicine

## 2014-05-12 ENCOUNTER — Other Ambulatory Visit: Payer: Self-pay | Admitting: Family Medicine

## 2014-05-12 NOTE — Telephone Encounter (Signed)
Patient has not had this refilled by his PCP since April. Need to assess the use of Colchicine and Simvastatin with PCP. Please have patient schedule follow up with PCP to discuss medication.

## 2014-05-13 NOTE — Telephone Encounter (Signed)
Left voice message for pt to call and schedule an appointment with PCP for medication follow up.  Derl Barrow, RN

## 2014-06-23 ENCOUNTER — Other Ambulatory Visit: Payer: Self-pay | Admitting: Family Medicine

## 2014-07-21 ENCOUNTER — Other Ambulatory Visit: Payer: Self-pay | Admitting: Family Medicine

## 2014-07-22 NOTE — Telephone Encounter (Signed)
Pt called and needs a refill on his Vytorin jw

## 2014-08-18 ENCOUNTER — Other Ambulatory Visit: Payer: Self-pay | Admitting: Family Medicine

## 2014-09-19 ENCOUNTER — Telehealth: Payer: Self-pay | Admitting: Family Medicine

## 2014-09-19 NOTE — Telephone Encounter (Signed)
Prescription drug plan has changed. His medication Vitoren is now costing $99. Could there be another med that would cost less?

## 2014-09-22 ENCOUNTER — Other Ambulatory Visit: Payer: Self-pay | Admitting: Family Medicine

## 2014-09-22 MED ORDER — SIMVASTATIN 40 MG PO TABS: 40.0000 mg | ORAL_TABLET | Freq: Every day | ORAL | Status: DC

## 2014-09-22 NOTE — Telephone Encounter (Signed)
We can switch him to simvastatin alone, which is the stronger of the two medicines in his Vytorin.  This should be around $7 for a 30 day supply.

## 2014-09-22 NOTE — Telephone Encounter (Signed)
Pt called because he needs two things. One is a refill on his Metoprolol called in. The next thing is that his Vytorin is now over 100.00 and he can not afford this. He is hoping that there is another medication that is similar to this that would be a lot cheaper. jw

## 2014-09-23 NOTE — Telephone Encounter (Signed)
Spoke with patient and informed him of below 

## 2014-10-11 ENCOUNTER — Ambulatory Visit (INDEPENDENT_AMBULATORY_CARE_PROVIDER_SITE_OTHER): Payer: Medicare Other | Admitting: Family Medicine

## 2014-10-11 ENCOUNTER — Encounter: Payer: Self-pay | Admitting: Family Medicine

## 2014-10-11 VITALS — BP 125/77 | HR 54 | Temp 98.4°F | Ht 72.0 in | Wt 209.0 lb

## 2014-10-11 DIAGNOSIS — M1000 Idiopathic gout, unspecified site: Secondary | ICD-10-CM | POA: Diagnosis not present

## 2014-10-11 DIAGNOSIS — F411 Generalized anxiety disorder: Secondary | ICD-10-CM | POA: Diagnosis not present

## 2014-10-11 DIAGNOSIS — Z7189 Other specified counseling: Secondary | ICD-10-CM | POA: Diagnosis not present

## 2014-10-11 DIAGNOSIS — K219 Gastro-esophageal reflux disease without esophagitis: Secondary | ICD-10-CM

## 2014-10-11 DIAGNOSIS — M545 Low back pain: Secondary | ICD-10-CM | POA: Diagnosis not present

## 2014-10-11 DIAGNOSIS — I209 Angina pectoris, unspecified: Secondary | ICD-10-CM | POA: Diagnosis not present

## 2014-10-11 DIAGNOSIS — G8929 Other chronic pain: Secondary | ICD-10-CM | POA: Insufficient documentation

## 2014-10-11 MED ORDER — HYDROCODONE-ACETAMINOPHEN 5-325 MG PO TABS: 1.0000 | ORAL_TABLET | Freq: Three times a day (TID) | ORAL | Status: DC | PRN

## 2014-10-11 NOTE — Assessment & Plan Note (Signed)
At goal and doing well.  No pain.  Return precautions/warning signs provided.

## 2014-10-11 NOTE — Assessment & Plan Note (Signed)
1 attack in past year.  Deferred preventative medications.

## 2014-10-11 NOTE — Progress Notes (Signed)
Subjective:    Corey GASPARYAN Sr. is a 59 y.o. male who presents to [facility] today:  1.  Chrnoic angina: Longstanding problem for patient.  Only occurs when he becomes "overheated."  Denies any angina with exertion.  No chest pain within past 6 months.  No dyspnea, LE edema, palpitations.    2.  Back pain/chronic pain: Longstanding problem for patient. Affects ADLs.  Has "good days and bad days."  BL lumbar pain.  Can achieve ADLs with Flexeril and Norco.  Last refill was in July 2015.     3.  Spots on back:  Has noticed 2 separate areas in past several months.  ONe is new, another was a "bump" he had removed several years ago. No pain/redness/irritation.    ROS as above per HPI, otherwise neg.   The following portions of the patient's history were reviewed and updated as appropriate: allergies, current medications, past medical history, family and social history, and problem list. Patient is a nonsmoker.    PMH reviewed.  Past Medical History  Diagnosis Date  . GERD (gastroesophageal reflux disease)   . Allergy     Allergic rhinitis  . Chronic back pain     Stemming from prior lumbar diskectomy  . Obesity   . Hyperlipidemia   . Hypertension   . Substance abuse     Known chronic alcoholic - not interested in quitting (current as of 2014)  . CAD (coronary artery disease) 04/2005    Mild obstructive ramus intermedius disease (30-50%) and 50-60% proximal  . Angina pectoris 2006    Known coronary vasospasm -- treated with combination of long-acting nitrates and calcium channel blockers; NTG when necessary   Past Surgical History  Procedure Laterality Date  . Colonoscopy w/ biopsies  2011    Multiple small polyps and s/p polypectomy -- recommended repeat in 2 years due to multiple small polyps  . Cardiovascular stress test  2005    Dr. Daneen Schick -- Cardiolite stress test showed no evidence of ischemia  . Esophageal dilation  1997  . Back surgery  1989, 1997    Chronic back pain  stemming from this    Medications reviewed. Current Outpatient Prescriptions  Medication Sig Dispense Refill  . amitriptyline (ELAVIL) 50 MG tablet take 1 tablet by mouth at bedtime if needed for sleep 90 tablet 2  . aspirin 81 MG EC tablet Take 81 mg by mouth daily.      . busPIRone (BUSPAR) 30 MG tablet take 1 tablet by mouth twice a day 180 tablet 3  . colchicine 0.6 MG tablet Take 1 tablet (0.6 mg total) by mouth 2 (two) times daily. 30 tablet 1  . cyclobenzaprine (FLEXERIL) 10 MG tablet take 1 tablet by mouth twice a day if needed muscle spasm 30 tablet 1  . fexofenadine (ALLEGRA) 60 MG tablet Take 1 tablet (60 mg total) by mouth daily as needed. For allergies 30 tablet 3  . HYDROcodone-acetaminophen (NORCO) 5-325 MG per tablet Take 1 tablet by mouth every 8 (eight) hours as needed. Do not fill until 60 days from this date 60 tablet 0  . HYDROcodone-acetaminophen (NORCO) 5-325 MG per tablet Take 1 tablet by mouth every 8 (eight) hours as needed. Do not fill until 30 days from this date 60 tablet 0  . HYDROcodone-acetaminophen (NORCO/VICODIN) 5-325 MG per tablet Take 1 tablet by mouth every 8 (eight) hours as needed. 60 tablet 0  . isosorbide mononitrate (IMDUR) 60 MG 24 hr tablet take  1 tablet by mouth once daily 90 tablet 2  . metoprolol (LOPRESSOR) 100 MG tablet take 1 tablet by mouth twice a day 120 tablet 3  . NITROSTAT 0.4 MG SL tablet place 1 tablet under the tongue if needed every 5 minutes for chest pain FOR UP TO 3 DOSES. 25 tablet 1  . omeprazole (PRILOSEC) 20 MG capsule take 1 capsule by mouth once daily 90 capsule 2  . simvastatin (ZOCOR) 40 MG tablet Take 1 tablet (40 mg total) by mouth at bedtime. 90 tablet 3  . traZODone (DESYREL) 50 MG tablet take 1/2 to 1 tablet by mouth at bedtime 30 tablet 3  . VYTORIN 10-20 MG per tablet take 1 tablet by mouth at bedtime 30 tablet 6   No current facility-administered medications for this visit.     Objective:   Physical Exam BP  125/77 mmHg  Pulse 54  Temp(Src) 98.4 F (36.9 C) (Oral)  Ht 6' (1.829 m)  Wt 209 lb (94.802 kg)  BMI 28.34 kg/m2 Gen:  Alert, cooperative patient who appears stated age in no acute distress.  Vital signs reviewed. HEENT: EOMI,  MMM.  Canals clear BL.   Cardiac:  Regular rate and rhythm without murmur auscultated.  Good S1/S2. Pulm:  Clear to auscultation bilaterally with good air movement.  No wheezes or rales noted.   Abd:  Soft/nondistended/nontender.   Exts: Non edematous BL  LE, warm and well perfused.  Psych:  Not depressed or anxious appearing.  Linear and coherent thought process as evidenced by speech pattern. Smiles spontaneously.    No results found for this or any previous visit (from the past 72 hour(s)).

## 2014-10-11 NOTE — Assessment & Plan Note (Signed)
Insurance no lnoger covers Vytorin.  LDL is at goal.  Can simply switch to simvastatin.  Already sent this in several weeks ago.

## 2014-10-11 NOTE — Patient Instructions (Signed)
Come back some time in the next month for a urine test.  I've already put the order in.  Schedule a lab appointment for this.    Hydrocodone refills today.   Let me know when you need refills on your other medicines.  Otherwise things look good.

## 2014-10-11 NOTE — Assessment & Plan Note (Signed)
Doing well currently.  See Encounter for chronic pain above for details.

## 2014-10-11 NOTE — Assessment & Plan Note (Signed)
Chronic.  Controlled with Buspar.  No refills today.

## 2014-10-11 NOTE — Assessment & Plan Note (Signed)
At goal.  Controlled with longstanding prilosec. Upon which he has become fairly dependent.  NO refills needed today.

## 2014-10-17 ENCOUNTER — Other Ambulatory Visit: Payer: Medicare Other

## 2014-10-17 DIAGNOSIS — M545 Low back pain: Secondary | ICD-10-CM | POA: Diagnosis not present

## 2014-10-17 NOTE — Progress Notes (Signed)
UDS DONE TODAY Corey Watts

## 2014-10-18 LAB — DRUG SCR UR, PAIN MGMT, REFLEX CONF
AMPHETAMINE SCRN UR: NEGATIVE
Barbiturate Quant, Ur: NEGATIVE
Benzodiazepines.: NEGATIVE
COCAINE METABOLITES: NEGATIVE
Creatinine,U: 40.95 mg/dL
Marijuana Metabolite: NEGATIVE
Methadone: NEGATIVE
Phencyclidine (PCP): NEGATIVE
Propoxyphene: NEGATIVE

## 2014-10-19 ENCOUNTER — Other Ambulatory Visit: Payer: Self-pay | Admitting: Family Medicine

## 2014-10-21 LAB — OPIATES/OPIOIDS (LC/MS-MS)
Codeine Urine: NEGATIVE ng/mL (ref <50)
HYDROCODONE: 562 ng/mL — AB (ref <50)
Hydromorphone: 68 ng/mL — ABNORMAL HIGH (ref <50)
Morphine Urine: NEGATIVE ng/mL (ref <50)
NORHYDROCODONE, UR: 228 ng/mL — AB (ref <50)
Noroxycodone, Ur: NEGATIVE ng/mL (ref <50)
OXYCODONE, UR: NEGATIVE ng/mL (ref <50)
OXYMORPHONE, URINE: NEGATIVE ng/mL (ref <50)

## 2014-10-24 ENCOUNTER — Other Ambulatory Visit: Payer: Self-pay | Admitting: Family Medicine

## 2014-10-24 NOTE — Telephone Encounter (Signed)
Note to nursing staff - please schedule appointment for evaluation of insomnia, tell patient that 30 day supply of trazodone sent to pharmacy but will need appointment prior to next fill, thanks

## 2014-10-25 ENCOUNTER — Telehealth: Payer: Self-pay | Admitting: Family Medicine

## 2014-10-25 NOTE — Telephone Encounter (Signed)
Wife called and doesn't understand why her husband has to come in to get refills. She wants someone to call and explain this to her. jw

## 2014-10-25 NOTE — Telephone Encounter (Signed)
Spoke with patient and informed him that rx's have been sent in. He was also informed that he will need to make an appointment for the future refills of trazodone

## 2014-10-25 NOTE — Telephone Encounter (Signed)
Dr. Ree Kida was covering Dr. Cindra Presume box at the time. Will patient still need to come in for next refill. Wife was questioning this.

## 2014-10-27 NOTE — Telephone Encounter (Signed)
Patient will not need to come back for next refill.  He should just call when it is due.  Please call and let him know about this.

## 2014-10-28 NOTE — Telephone Encounter (Signed)
LVM for patient to call back to inform him of below

## 2014-10-28 NOTE — Telephone Encounter (Signed)
Spoke with patient and informed him of below 

## 2014-11-22 ENCOUNTER — Other Ambulatory Visit: Payer: Self-pay | Admitting: Family Medicine

## 2014-12-19 DIAGNOSIS — K635 Polyp of colon: Secondary | ICD-10-CM | POA: Diagnosis not present

## 2014-12-19 DIAGNOSIS — D122 Benign neoplasm of ascending colon: Secondary | ICD-10-CM | POA: Diagnosis not present

## 2014-12-19 DIAGNOSIS — Z09 Encounter for follow-up examination after completed treatment for conditions other than malignant neoplasm: Secondary | ICD-10-CM | POA: Diagnosis not present

## 2014-12-19 DIAGNOSIS — D123 Benign neoplasm of transverse colon: Secondary | ICD-10-CM | POA: Diagnosis not present

## 2014-12-19 DIAGNOSIS — Z86010 Personal history of colonic polyps: Secondary | ICD-10-CM | POA: Diagnosis not present

## 2014-12-19 DIAGNOSIS — D120 Benign neoplasm of cecum: Secondary | ICD-10-CM | POA: Diagnosis not present

## 2014-12-19 DIAGNOSIS — D126 Benign neoplasm of colon, unspecified: Secondary | ICD-10-CM | POA: Diagnosis not present

## 2015-01-04 ENCOUNTER — Other Ambulatory Visit: Payer: Self-pay | Admitting: *Deleted

## 2015-01-04 MED ORDER — CYCLOBENZAPRINE HCL 10 MG PO TABS: ORAL_TABLET | ORAL | Status: DC

## 2015-01-13 ENCOUNTER — Other Ambulatory Visit: Payer: Self-pay | Admitting: Family Medicine

## 2015-01-26 ENCOUNTER — Encounter: Payer: Self-pay | Admitting: Family Medicine

## 2015-01-26 ENCOUNTER — Ambulatory Visit (INDEPENDENT_AMBULATORY_CARE_PROVIDER_SITE_OTHER): Payer: Medicare Other | Admitting: Family Medicine

## 2015-01-26 VITALS — BP 114/72 | HR 56 | Temp 97.9°F | Ht 72.0 in | Wt 214.4 lb

## 2015-01-26 DIAGNOSIS — M10061 Idiopathic gout, right knee: Secondary | ICD-10-CM | POA: Diagnosis not present

## 2015-01-26 MED ORDER — PREDNISONE 20 MG PO TABS: ORAL_TABLET | ORAL | Status: DC

## 2015-01-26 MED ORDER — COLCHICINE 0.6 MG PO TABS: ORAL_TABLET | ORAL | Status: DC

## 2015-01-26 NOTE — Progress Notes (Signed)
   Subjective:    Patient ID: Corey Watts., male    DOB: 1956/04/02, 59 y.o.   MRN: [id number]  HPI: Pt presents to clinic for SDA for right knee pain, swelling, redness, and warmth for about 3 days. It started with a dull ache and now has worsened significantly; it is currently a 10/10, prevented him from sleeping last night, and is described to be "like a toothache." It is the same type of pain as the gout he has had in the past, but much worse, and he has never had knee gout before. He has had no falls or other injuries. He has no other joint pain. He states he drinks beer every day. He does not smoke. He has taken a few colchicine (only one or two over the past few days) and a couple of Vicodin, which he takes for chronic back pain, all without much help.  Review of Systems: As above. He has no fevers / chills, N/V, abdominal pain, or other systemic symptoms.     Objective:   Physical Exam BP 114/72 mmHg  Pulse 56  Temp(Src) 97.9 F (36.6 C) (Oral)  Ht 6' (1.829 m)  Wt 214 lb 6.4 oz (97.251 kg)  BMI 29.07 kg/m2 Gen: uncomfortable-appearing adult male HEENT: [place]/AT, MMM Pulm: normal WOB Abd: soft, nontender Ext: warm, well-perfused, no LE edema; no calf tenderness, redness, or swelling MSK: right knee visibly swollen but without frank joint effusion, mildly red and warm to palpation  Marked active and passive ROM limitation due to pain  No frank joint instability  Gait antalgic     Assessment & Plan:  59yo male with likely acute gout flare of the right knee - strongly doubt septic joint (no injury, broken skin, or systemic symptoms) or DVT (no LEG swelling, redness, or warmth) - Rx for colchicine (0.6 mg, two tablets followed by a third today, then TID until pain improves, then BID for ~1 week after pain resolves) - Rx for prednisone (40 mg daily until pain improves, then taper off over ~2 weeks) - avoid NSAIDs as pt has hx of CAD and reports "reaction" to Toradol in the  past - defer xrays or lab draw, for now - defer considering starting uric acid-lowering medication to PCP Dr. Mingo Amber, once acute flare is over - f/u in 2-3 weeks with Dr. Mingo Amber, or return to clinic or present to the ED sooner if any frank worsening or development of systemic symtpoms  Emmaline Kluver, MD PGY-3, [facility] 01/26/2015, 10:00 AM

## 2015-01-26 NOTE — Patient Instructions (Signed)
Thank you for coming in, today!  I think your knee pain is probably gout. I want you to take colchicine, again. Take 2 pills this morning, then another one in about an hour. After today, take one pill three a day until the pain gets better. After the pain gets better, take one pill twice a day for another 5-7 days.  I also want you to take prednisone, a steroid (which is a strong anti-inflammatory). Take two tablets every day until the pain starts to get better. Once the pain is better, take two pills for three days, a pill and a half for three days, one pill for three days, a half-pill for three days, then stop.  Come back to see Dr. Mingo Amber in about 2-3 weeks. Call or come back sooner if you need.  Please feel free to call with any questions or concerns at any time, at [phone]. --Dr. Venetia Maxon

## 2015-01-27 ENCOUNTER — Other Ambulatory Visit: Payer: Self-pay | Admitting: Family Medicine

## 2015-02-14 ENCOUNTER — Encounter: Payer: Self-pay | Admitting: Family Medicine

## 2015-02-14 ENCOUNTER — Ambulatory Visit (INDEPENDENT_AMBULATORY_CARE_PROVIDER_SITE_OTHER): Payer: Medicare Other | Admitting: Family Medicine

## 2015-02-14 VITALS — BP 134/77 | HR 53 | Temp 98.4°F | Ht 72.0 in | Wt 211.5 lb

## 2015-02-14 DIAGNOSIS — F1010 Alcohol abuse, uncomplicated: Secondary | ICD-10-CM

## 2015-02-14 DIAGNOSIS — J309 Allergic rhinitis, unspecified: Secondary | ICD-10-CM

## 2015-02-14 DIAGNOSIS — I10 Essential (primary) hypertension: Secondary | ICD-10-CM | POA: Diagnosis not present

## 2015-02-14 DIAGNOSIS — M10061 Idiopathic gout, right knee: Secondary | ICD-10-CM

## 2015-02-14 DIAGNOSIS — Z114 Encounter for screening for human immunodeficiency virus [HIV]: Secondary | ICD-10-CM

## 2015-02-14 DIAGNOSIS — M545 Low back pain: Secondary | ICD-10-CM | POA: Diagnosis not present

## 2015-02-14 DIAGNOSIS — G8929 Other chronic pain: Secondary | ICD-10-CM

## 2015-02-14 DIAGNOSIS — I209 Angina pectoris, unspecified: Secondary | ICD-10-CM

## 2015-02-14 DIAGNOSIS — Z7189 Other specified counseling: Secondary | ICD-10-CM

## 2015-02-14 LAB — COMPREHENSIVE METABOLIC PANEL
ALT: 29 U/L (ref 0–53)
AST: 29 U/L (ref 0–37)
Albumin: 4.2 g/dL (ref 3.5–5.2)
Alkaline Phosphatase: 44 U/L (ref 39–117)
BUN: 9 mg/dL (ref 6–23)
CALCIUM: 9.1 mg/dL (ref 8.4–10.5)
CO2: 27 mEq/L (ref 19–32)
Chloride: 103 mEq/L (ref 96–112)
Creat: 0.96 mg/dL (ref 0.50–1.35)
GLUCOSE: 106 mg/dL — AB (ref 70–99)
POTASSIUM: 4.5 meq/L (ref 3.5–5.3)
SODIUM: 138 meq/L (ref 135–145)
Total Bilirubin: 0.5 mg/dL (ref 0.2–1.2)
Total Protein: 6.5 g/dL (ref 6.0–8.3)

## 2015-02-14 LAB — LIPID PANEL
Cholesterol: 132 mg/dL (ref 0–200)
HDL: 34 mg/dL — AB (ref >=40)
LDL Cholesterol: 22 mg/dL (ref 0–99)
Total CHOL/HDL Ratio: 3.9 Ratio
Triglycerides: 382 mg/dL — ABNORMAL HIGH (ref <150)
VLDL: 76 mg/dL — ABNORMAL HIGH (ref 0–40)

## 2015-02-14 LAB — CBC
HCT: 40.2 % (ref 39.0–52.0)
Hemoglobin: 13.7 g/dL (ref 13.0–17.0)
MCH: 33.3 pg (ref 26.0–34.0)
MCHC: 34.1 g/dL (ref 30.0–36.0)
MCV: 97.6 fL (ref 78.0–100.0)
MPV: 12.2 fL (ref 8.6–12.4)
PLATELETS: 143 10*3/uL — AB (ref 150–400)
RBC: 4.12 MIL/uL — AB (ref 4.22–5.81)
RDW: 14.3 % (ref 11.5–15.5)
WBC: 3.8 10*3/uL — ABNORMAL LOW (ref 4.0–10.5)

## 2015-02-14 MED ORDER — HYDROCODONE-ACETAMINOPHEN 5-325 MG PO TABS: 1.0000 | ORAL_TABLET | Freq: Three times a day (TID) | ORAL | Status: DC | PRN

## 2015-02-14 MED ORDER — CETIRIZINE HCL 10 MG PO TABS: 10.0000 mg | ORAL_TABLET | Freq: Every day | ORAL | Status: DC

## 2015-02-14 MED ORDER — MONTELUKAST SODIUM 10 MG PO TABS: 10.0000 mg | ORAL_TABLET | Freq: Every day | ORAL | Status: DC

## 2015-02-14 NOTE — Assessment & Plan Note (Signed)
Resolved.  No further attacks.

## 2015-02-14 NOTE — Assessment & Plan Note (Signed)
Failed Allegra.  Unable to take intranasal steroids Will attempt Zyrtec and Singulair.  I assume there will be a prior auth.

## 2015-02-14 NOTE — Assessment & Plan Note (Signed)
No changes to regimen. Checking labs today.

## 2015-02-14 NOTE — Assessment & Plan Note (Signed)
Stable.  No recent anginal attacks.  Continue to keep NTG on his person.  To let me know if CP recurs.

## 2015-02-14 NOTE — Assessment & Plan Note (Signed)
Counseled to quit completely.  He is cutting back slowly

## 2015-02-14 NOTE — Progress Notes (Signed)
Subjective:    Corey PURDY Sr. is a 59 y.o. male who presents to [facility] today for FU for gout:  1.  Gout FU:  Started about 2 weeks ago.  Came here.  Prescribed colchicine and prednisone.  Stopped these due to improvement on Saturday.  No further pain since then.  No other areas of joint swelling or redness.   2.  Hypertension:  Long-term problem for this patient.  No adverse effects from medication.  Not checking it regularly.  No HA, CP, dizziness, shortness of breath, palpitations, or LE swelling.   BP Readings from Last 3 Encounters:  02/14/15 134/77  01/26/15 114/72  10/11/14 125/77   3.  Chronic stable angina:  No further episodes of chest pain for past several months.  Has NTG which he has not taken.  No dyspnea.   4.  Seasonal allergies:  Worse since change in seasons.  Rhinorrhea, worse at night.  No relief with Allegra.  Cannot take Flonase or other intranasal corticosteroids due to nosebleeds.   Chronic back pain:  Stable.  A little more sore than usual due to painting over the weekend.  No red flags.    ROS as above per HPI, otherwise neg.    The following portions of the patient's history were reviewed and updated as appropriate: allergies, current medications, past medical history, family and social history, and problem list. Patient is a nonsmoker.    PMH reviewed.  Past Medical History  Diagnosis Date  . GERD (gastroesophageal reflux disease)   . Allergy     Allergic rhinitis  . Chronic back pain     Stemming from prior lumbar diskectomy  . Obesity   . Hyperlipidemia   . Hypertension   . Substance abuse     Known chronic alcoholic - not interested in quitting (current as of 2014)  . CAD (coronary artery disease) 04/2005    Mild obstructive ramus intermedius disease (30-50%) and 50-60% proximal  . Angina pectoris 2006    Known coronary vasospasm -- treated with combination of long-acting nitrates and calcium channel blockers; NTG when necessary   Past Surgical  History  Procedure Laterality Date  . Colonoscopy w/ biopsies  2011    Multiple small polyps and s/p polypectomy -- recommended repeat in 2 years due to multiple small polyps  . Cardiovascular stress test  2005    Dr. Daneen Schick -- Cardiolite stress test showed no evidence of ischemia  . Esophageal dilation  1997  . Back surgery  1989, 1997    Chronic back pain stemming from this    Medications reviewed. Current Outpatient Prescriptions  Medication Sig Dispense Refill  . amitriptyline (ELAVIL) 50 MG tablet take 1 tablet by mouth at bedtime if needed for sleep 90 tablet 2  . aspirin 81 MG EC tablet Take 81 mg by mouth daily.      . busPIRone (BUSPAR) 30 MG tablet take 1 tablet by mouth twice a day 180 tablet 3  . colchicine 0.6 MG tablet On day 1, take two pills followed by a third pill after 1 hour. After that follow instructions from Dr. Venetia Maxon. 60 tablet 1  . cyclobenzaprine (FLEXERIL) 10 MG tablet take 1 tablet by mouth twice a day if needed muscle spasm 30 tablet 1  . fexofenadine (ALLEGRA) 60 MG tablet Take 1 tablet (60 mg total) by mouth daily as needed. For allergies 30 tablet 3  . HYDROcodone-acetaminophen (NORCO) 5-325 MG per tablet Take 1 tablet by mouth  every 8 (eight) hours as needed. Do not fill until 60 days from this date 60 tablet 0  . HYDROcodone-acetaminophen (NORCO) 5-325 MG per tablet Take 1 tablet by mouth every 8 (eight) hours as needed. Do not fill until 30 days from this date 60 tablet 0  . HYDROcodone-acetaminophen (NORCO/VICODIN) 5-325 MG per tablet Take 1 tablet by mouth every 8 (eight) hours as needed. 60 tablet 0  . isosorbide mononitrate (IMDUR) 60 MG 24 hr tablet take 1 tablet by mouth once daily 90 tablet 2  . metoprolol (LOPRESSOR) 100 MG tablet take 1 tablet by mouth twice a day 120 tablet 3  . NITROSTAT 0.4 MG SL tablet place 1 tablet under the tongue if needed every 5 minutes for chest pain FOR UP TO 3 DOSES. 25 tablet 1  . omeprazole (PRILOSEC) 20 MG  capsule take 1 capsule by mouth once daily 90 capsule 1  . predniSONE (DELTASONE) 20 MG tablet Take two pills per day for gout pain. Once pain goes away, taper off by instructions from Dr. Venetia Maxon. 40 tablet 0  . simvastatin (ZOCOR) 40 MG tablet Take 1 tablet (40 mg total) by mouth at bedtime. 90 tablet 3  . traZODone (DESYREL) 50 MG tablet take 1/2 to 1 tablet by mouth at bedtime 30 tablet 6   No current facility-administered medications for this visit.     Objective:   Physical Exam BP 134/77 mmHg  Pulse 53  Temp(Src) 98.4 F (36.9 C) (Oral)  Ht 6' (1.829 m)  Wt 211 lb 8 oz (95.936 kg)  BMI 28.68 kg/m2 Gen:  Alert, cooperative patient who appears stated age in no acute distress.  Vital signs reviewed. HEENT: EOMI,  MMM.  Nasal turbinates enlarged BL. Upper dentures in place.  Fair dentition native teeth lower mandible.   Cardiac:  Regular rate and rhythm without murmur auscultated.  Good S1/S2. Pulm:  Clear to auscultation bilaterally with good air movement.  No wheezes or rales noted.   Abd:  Soft/nondistended/nontender.  Obese Exts: Non edematous BL  LE, warm and well perfused.  Neuro:  No focal deficits. Noted.   No results found for this or any previous visit (from the past 72 hour(s)).

## 2015-02-14 NOTE — Assessment & Plan Note (Signed)
Rechecking lipid panel today

## 2015-02-14 NOTE — Assessment & Plan Note (Signed)
Low risk for abuse/diversion.  Has had negative UDS. Database good today.  Provide for next 3 months.  Able to functionally complete ADLs with this medication.

## 2015-02-14 NOTE — Addendum Note (Signed)
Addended by: Alveda Reasons on: 02/14/2015 09:31 AM   Modules accepted: Orders

## 2015-02-14 NOTE — Patient Instructions (Signed)
Things look good today.  I'm glad your gout has cleared up.  Refill for hydrocodone.    Labs today.

## 2015-02-15 ENCOUNTER — Encounter: Payer: Self-pay | Admitting: Family Medicine

## 2015-02-15 LAB — HIV ANTIBODY (ROUTINE TESTING W REFLEX): HIV 1&2 Ab, 4th Generation: NONREACTIVE

## 2015-04-20 ENCOUNTER — Other Ambulatory Visit: Payer: Self-pay | Admitting: Family Medicine

## 2015-06-08 ENCOUNTER — Other Ambulatory Visit: Payer: Self-pay | Admitting: Family Medicine

## 2015-06-19 ENCOUNTER — Other Ambulatory Visit: Payer: Self-pay | Admitting: Family Medicine

## 2015-06-26 ENCOUNTER — Other Ambulatory Visit: Payer: Self-pay | Admitting: Family Medicine

## 2015-07-18 ENCOUNTER — Encounter: Payer: Self-pay | Admitting: Family Medicine

## 2015-07-18 ENCOUNTER — Ambulatory Visit (INDEPENDENT_AMBULATORY_CARE_PROVIDER_SITE_OTHER): Payer: Medicare Other | Admitting: Family Medicine

## 2015-07-18 VITALS — BP 131/74 | HR 68 | Temp 98.5°F | Ht 72.0 in | Wt 216.3 lb

## 2015-07-18 DIAGNOSIS — M79632 Pain in left forearm: Secondary | ICD-10-CM | POA: Diagnosis not present

## 2015-07-18 DIAGNOSIS — Z7189 Other specified counseling: Secondary | ICD-10-CM

## 2015-07-18 DIAGNOSIS — I209 Angina pectoris, unspecified: Secondary | ICD-10-CM

## 2015-07-18 DIAGNOSIS — M545 Low back pain: Secondary | ICD-10-CM

## 2015-07-18 DIAGNOSIS — G8929 Other chronic pain: Secondary | ICD-10-CM

## 2015-07-18 DIAGNOSIS — I10 Essential (primary) hypertension: Secondary | ICD-10-CM

## 2015-07-18 MED ORDER — HYDROCODONE-ACETAMINOPHEN 5-325 MG PO TABS: 1.0000 | ORAL_TABLET | Freq: Three times a day (TID) | ORAL | Status: DC | PRN

## 2015-07-18 MED ORDER — BUSPIRONE HCL 30 MG PO TABS: 30.0000 mg | ORAL_TABLET | Freq: Two times a day (BID) | ORAL | Status: DC

## 2015-07-18 MED ORDER — ISOSORBIDE MONONITRATE ER 60 MG PO TB24: 60.0000 mg | ORAL_TABLET | Freq: Every day | ORAL | Status: DC

## 2015-07-18 NOTE — Progress Notes (Signed)
Subjective:    Corey NAZAIRE Sr. is a 59 y.o. male who presents to [facility] today for several issues:  1.  Chronic back pain:  Long-standing issue for this patient. Complex ADLs. Has "good days and bad days." Chronically with bilateral lumbar pain occasionally with radiculopathy but never passed his knee. Able to achieve ADLs with Flexeril Norco. Last refill was May 2016 (5 months ago) for [employer]    2.  Hypertension:  Long-term problem for this patient.  No adverse effects from medication.  Not checking it regularly.  No HA, CP, dizziness, shortness of breath, palpitations, or LE swelling.   BP Readings from Last 3 Encounters:  02/14/15 134/77  01/26/15 114/72  10/11/14 125/77    3.  Chronic angina:  Stable. Still taking his metoprolol. Also Imdur. Knows how far/hard he can exert himself before this occurs. Has nitroglycerin and has not needed it for the past several months at least. Denies any palpitations. No diaphoresis. States he has not had any episodes of angina since last time I saw him. No lower extremity edema.  4.  Left forearm pain: New problem for the patient. States he first noticed this about 4 weeks ago. States it is just on the inside of his elbow.  Worse when attempting to lift something like a cup.  Has dropped things previously.  Feels "swollen" and "tight" at the end of the day.  No numbness/paresthesias.    ROS as above per HPI, otherwise neg.  The following portions of the patient's history were reviewed and updated as appropriate: allergies, current medications, past medical history, family and social history, and problem list. Patient is a nonsmoker.    PMH reviewed.  Past Medical History  Diagnosis Date  . GERD (gastroesophageal reflux disease)   . Allergy     Allergic rhinitis  . Chronic back pain     Stemming from prior lumbar diskectomy  . Obesity   . Hyperlipidemia   . Hypertension   . Substance abuse     Known chronic alcoholic - not interested in quitting  (current as of 2014)  . CAD (coronary artery disease) 04/2005    Mild obstructive ramus intermedius disease (30-50%) and 50-60% proximal  . Angina pectoris 2006    Known coronary vasospasm -- treated with combination of long-acting nitrates and calcium channel blockers; NTG when necessary   Past Surgical History  Procedure Laterality Date  . Colonoscopy w/ biopsies  2011    Multiple small polyps and s/p polypectomy -- recommended repeat in 2 years due to multiple small polyps  . Cardiovascular stress test  2005    Dr. Daneen Schick -- Cardiolite stress test showed no evidence of ischemia  . Esophageal dilation  1997  . Back surgery  1989, 1997    Chronic back pain stemming from this    Medications reviewed. Current Outpatient Prescriptions  Medication Sig Dispense Refill  . amitriptyline (ELAVIL) 50 MG tablet take 1 tablet by mouth at bedtime if needed for sleep 90 tablet 2  . aspirin 81 MG EC tablet Take 81 mg by mouth daily.      . busPIRone (BUSPAR) 30 MG tablet take 1 tablet by mouth twice a day 180 tablet 3  . cetirizine (ZYRTEC) 10 MG tablet Take 1 tablet (10 mg total) by mouth daily. 30 tablet 11  . cyclobenzaprine (FLEXERIL) 10 MG tablet take 1 tablet by mouth twice a day if needed muscle spasm 30 tablet 1  . fexofenadine (ALLEGRA) 60  MG tablet Take 1 tablet (60 mg total) by mouth daily as needed. For allergies 30 tablet 3  . HYDROcodone-acetaminophen (NORCO) 5-325 MG per tablet Take 1 tablet by mouth every 8 (eight) hours as needed. Do not fill until 60 days from this date 60 tablet 0  . HYDROcodone-acetaminophen (NORCO) 5-325 MG per tablet Take 1 tablet by mouth every 8 (eight) hours as needed. Do not fill until 30 days from this date 60 tablet 0  . HYDROcodone-acetaminophen (NORCO/VICODIN) 5-325 MG per tablet Take 1 tablet by mouth every 8 (eight) hours as needed. 60 tablet 0  . isosorbide mononitrate (IMDUR) 60 MG 24 hr tablet take 1 tablet by mouth once daily 90 tablet 2  .  metoprolol (LOPRESSOR) 100 MG tablet take 1 tablet by mouth twice a day 120 tablet 3  . montelukast (SINGULAIR) 10 MG tablet take 1 tablet by mouth at bedtime 30 tablet 3  . NITROSTAT 0.4 MG SL tablet place 1 tablet under the tongue if needed every 5 minutes for chest pain for 3 doses IF NO RELIEF AFTER 3RD DOSE CALL PRESCRIBER OR 911. 25 tablet 1  . omeprazole (PRILOSEC) 20 MG capsule take 1 capsule by mouth once daily 90 capsule 1  . simvastatin (ZOCOR) 40 MG tablet Take 1 tablet (40 mg total) by mouth at bedtime. 90 tablet 3  . traZODone (DESYREL) 50 MG tablet take 1/2 to 1 tablet by mouth at bedtime 30 tablet 6   No current facility-administered medications for this visit.     Objective:   Physical Exam BP 131/74 mmHg  Pulse 68  Temp(Src) 98.5 F (36.9 C) (Oral)  Ht 6' (1.829 m)  Wt 216 lb 4.8 oz (98.113 kg)  BMI 29.33 kg/m2 Gen:  Alert, cooperative patient who appears stated age in no acute distress.  Vital signs reviewed. HEENT: EOMI,  MMM.  Upper dentures noted.  Cardiac:  Regular rate and rhythm without murmur auscultated.  Chest:  No TTP noted.  Pulm:  Clear to auscultation bilaterally with good air movement.  No wheezes or rales noted.   MSK:  TTP along lateral aspect of Left forearm, several cm superior to lateral epicondyle.  Tender along supinator muscle.  Tenderness when attempting supination.  Ext:  No LE edema.   No results found for this or any previous visit (from the past 72 hour(s)).

## 2015-07-18 NOTE — Patient Instructions (Signed)
Talk to the ladies up front to schedule an appointment down at Sports Medicine for your elbow.  Otherwise things look good from my standpoint.   Refilled your medicines.   It was good to see you again today

## 2015-07-20 DIAGNOSIS — M79632 Pain in left forearm: Secondary | ICD-10-CM | POA: Insufficient documentation

## 2015-07-20 NOTE — Assessment & Plan Note (Signed)
At goal, stable.  No med changes.

## 2015-07-20 NOTE — Assessment & Plan Note (Signed)
Chronic stable angina. Continue Imdur/beta blocker.  Knows warning signs/red flags.  Has NTG on his person.

## 2015-07-20 NOTE — Assessment & Plan Note (Signed)
Chronic back pain.  Low risk for diversion/abuse Refill for chronic narcotics today.

## 2015-07-20 NOTE — Assessment & Plan Note (Signed)
New issue, present less than a month.   Not relieved with chronic narcotics.   Wonder if there is muscle strain Patient requesting referral to sports med - will comply.

## 2015-08-07 ENCOUNTER — Encounter: Payer: Self-pay | Admitting: Sports Medicine

## 2015-08-07 ENCOUNTER — Ambulatory Visit (INDEPENDENT_AMBULATORY_CARE_PROVIDER_SITE_OTHER): Payer: Medicare Other | Admitting: Sports Medicine

## 2015-08-07 VITALS — BP 147/74 | HR 56 | Ht 72.0 in | Wt 210.0 lb

## 2015-08-07 DIAGNOSIS — M79632 Pain in left forearm: Secondary | ICD-10-CM

## 2015-08-07 MED ORDER — PREDNISONE 10 MG PO TABS: ORAL_TABLET | ORAL | Status: DC

## 2015-08-07 MED ORDER — GABAPENTIN 300 MG PO CAPS: 300.0000 mg | ORAL_CAPSULE | Freq: Every day | ORAL | Status: DC

## 2015-08-07 NOTE — Progress Notes (Signed)
   Subjective:    Patient ID: Corey Pierini., male    DOB: May 07, 1956, 59 y.o.   MRN: [id number]  HPI chief complaint: Left arm pain and numbness  Very pleasant 59 year old right-hand-dominant male comes in today complaining of 2 months of left arm pain. He describes a burning type pain along the volar aspect of his proximal forearm and elbow. Pain will radiate into his hand and at times up into his humerus. This is accompanied with numbness and tingling as well as weakness with grip. Symptoms are most noticeable at night. He denies any trauma. He denies any neck pain. He has tried ibuprofen, Tylenol, and hydrocodone but nothing seems to help. Heat has also been ineffective. He has also noticed some swelling along his area of pain in his proximal forearm.  Past medical history reviewed Medications reviewed Allergies reviewed    Review of Systems As above    Objective:   Physical Exam  Well-developed, well-nourished. No acute distress. Awake alert and oriented 3.  Cervical spine: Full cervical range of motion with a negative Spurling's  Left elbow: There is firmness along the distal lateral elbow consistent with probable muscle spasm. This area is tender to palpation but there is no tenderness to palpation over the lateral epicondyle or medial epicondyle. He does have reproducible pain with wrist extension.  Neurological exam: Strength is a little bit limited on the left secondary to pain but there is no focal area of weakness appreciated. Reflexes are brisk and equal at the biceps, triceps, and brachial radialis tendons. No atrophy. Slight decrease in grip strength.      Assessment & Plan:  Left elbow pain of questioned etiology  Patient's symptoms are definitely neuropathic in nature likely secondary to a peripheral nerve entrapment. I'm not sure whether or not the area of tenderness in his elbow is due to muscle spasm or there is an underlying cyst or soft tissue mass. To  better evaluate I will start with x-rays and if unremarkable I will schedule an MRI to evaluate further. Patient will follow-up with me in the office after that study to go over the results. In the meantime I will put him on a 6 day Sterapred Dosepak and we'll place him on 3 her milligrams of gabapentin to take at night. He will discontinue his amitriptyline while he is taking the gabapentin. If the MRI is inconclusive I would consider an EMG/nerve conduction study.

## 2015-08-08 ENCOUNTER — Telehealth: Payer: Self-pay | Admitting: Family Medicine

## 2015-08-08 NOTE — Telephone Encounter (Signed)
Went to have MRI but cannot pay the co payments. Would like to go to the hospital to have it done. Please advise

## 2015-08-08 NOTE — Telephone Encounter (Signed)
Spoke to pt. Explained that since Dr. Micheline Chapman did the referral, he would be the one to make changes. Pt understood and will give Dr. Micheline Chapman a call. [name], CMA

## 2015-08-09 ENCOUNTER — Telehealth: Payer: Self-pay | Admitting: *Deleted

## 2015-08-09 NOTE — Telephone Encounter (Signed)
Patient cancelled MRI for now, too  expensive and would wait til maybe after the holidays to reschedule.

## 2015-08-13 ENCOUNTER — Inpatient Hospital Stay: Admission: RE | Admit: 2015-08-13 | Payer: Medicare Other | Source: Ambulatory Visit

## 2015-09-18 ENCOUNTER — Other Ambulatory Visit: Payer: Self-pay | Admitting: Family Medicine

## 2015-10-18 ENCOUNTER — Other Ambulatory Visit: Payer: Self-pay | Admitting: Family Medicine

## 2015-11-01 ENCOUNTER — Other Ambulatory Visit: Payer: Self-pay | Admitting: *Deleted

## 2015-11-02 MED ORDER — AMITRIPTYLINE HCL 50 MG PO TABS: ORAL_TABLET | ORAL | Status: DC

## 2015-11-28 ENCOUNTER — Encounter: Payer: Self-pay | Admitting: Family Medicine

## 2015-11-28 ENCOUNTER — Ambulatory Visit (INDEPENDENT_AMBULATORY_CARE_PROVIDER_SITE_OTHER): Payer: Medicare Other | Admitting: Family Medicine

## 2015-11-28 VITALS — BP 124/77 | HR 61 | Temp 98.2°F | Ht 72.0 in | Wt 214.8 lb

## 2015-11-28 DIAGNOSIS — G8929 Other chronic pain: Secondary | ICD-10-CM

## 2015-11-28 DIAGNOSIS — G622 Polyneuropathy due to other toxic agents: Secondary | ICD-10-CM

## 2015-11-28 DIAGNOSIS — F1010 Alcohol abuse, uncomplicated: Secondary | ICD-10-CM | POA: Diagnosis not present

## 2015-11-28 DIAGNOSIS — M545 Low back pain: Secondary | ICD-10-CM | POA: Diagnosis not present

## 2015-11-28 DIAGNOSIS — Z7189 Other specified counseling: Secondary | ICD-10-CM | POA: Diagnosis not present

## 2015-11-28 DIAGNOSIS — G629 Polyneuropathy, unspecified: Secondary | ICD-10-CM | POA: Insufficient documentation

## 2015-11-28 MED ORDER — HYDROCODONE-ACETAMINOPHEN 5-325 MG PO TABS: 1.0000 | ORAL_TABLET | Freq: Three times a day (TID) | ORAL | Status: DC | PRN

## 2015-11-28 NOTE — Assessment & Plan Note (Signed)
Most likely secondary to ongoing alcohol abuse. He reports he will try to tolerate this rather than cut back on his alcohol use. He's can come back to discuss if it worsens.

## 2015-11-28 NOTE — Assessment & Plan Note (Signed)
Persists. States he is down to 4-5 drinks a day which has been about his baseline for the past year so. Did discuss how this is contributing to his peripheral neuropathy. See above.

## 2015-11-28 NOTE — Progress Notes (Signed)
Subjective:    Corey PRIDMORE Sr. is a 60 y.o. male who presents to [facility] today for chronic back pain:  1.  Chronic back pain:  At baseline. Taking Norco daily for pain relief, though there are some days he doesn't need this.  Only taking his Flexeril on an as-needed basis. He has not needed this in the past week or so. No bladder/bowel incontinence.  These help him with his daily ADLs.  He does report walking for exercise daily.  2.  Peripheral neuropathy:  Started several weeks ago.  Describes burning/numbness in his feet bilateral soles. Not worsening certain times daily. No polyuria polydipsia. Describes this as "tingling needles in my feet."   ROS as above per HPI, otherwise neg.   The following portions of the patient's history were reviewed and updated as appropriate: allergies, current medications, past medical history, family and social history, and problem list. Patient is a nonsmoker.    PMH reviewed.    Medications reviewed.    Objective:   Physical Exam BP 124/77 mmHg  Pulse 61  Temp(Src) 98.2 F (36.8 C) (Oral)  Ht 6' (1.829 m)  Wt 214 lb 12.8 oz (97.433 kg)  BMI 29.13 kg/m2 Gen:  Alert, cooperative patient who appears stated age in no acute distress.  Vital signs reviewed. HEENT: EOMI,  MMM Cardiac:  Regular rate and rhythm without murmur auscultated.  Good S1/S2. Pulm:  Clear to auscultation bilaterally with good air movement.  No wheezes or rales noted.   Abd:  Soft/nondistended/nontender.  Exts: Non edematous BL  LE, warm and well perfused.  Neuro:  Sensation intact to fine touch, gross touch, pinprick bilateral plantar and dorsal aspects of feet.  No results found for this or any previous visit (from the past 72 hour(s)).

## 2015-11-28 NOTE — Patient Instructions (Addendum)
It was good to see you again today.  Make sure to consider cutting back on your drinking.  Refills for your medicines.

## 2015-11-28 NOTE — Assessment & Plan Note (Signed)
Refill for chronic pain medications today. No red flags.  3 months worth of vicodin. FU in 3 months to re-assess.  Continue exercise.

## 2016-01-26 ENCOUNTER — Other Ambulatory Visit: Payer: Self-pay | Admitting: *Deleted

## 2016-01-26 MED ORDER — TRAZODONE HCL 50 MG PO TABS: 25.0000 mg | ORAL_TABLET | Freq: Every day | ORAL | Status: DC

## 2016-01-26 NOTE — Telephone Encounter (Signed)
Patient called inquiring about his refill request.  Informed patient that it hasn't been refilled yet but that we like to give the providers 24-48 hours to complete.  He voiced understanding and states that he has 3 pills left and should be enough til Sunday. Jazmin Hartsell,CMA

## 2016-02-06 ENCOUNTER — Other Ambulatory Visit: Payer: Self-pay | Admitting: *Deleted

## 2016-02-06 MED ORDER — CETIRIZINE HCL 10 MG PO TABS: 10.0000 mg | ORAL_TABLET | Freq: Every day | ORAL | Status: DC

## 2016-02-07 ENCOUNTER — Other Ambulatory Visit: Payer: Self-pay | Admitting: *Deleted

## 2016-02-08 MED ORDER — METOPROLOL TARTRATE 100 MG PO TABS: 100.0000 mg | ORAL_TABLET | Freq: Two times a day (BID) | ORAL | Status: DC

## 2016-02-14 ENCOUNTER — Other Ambulatory Visit: Payer: Self-pay | Admitting: *Deleted

## 2016-02-15 MED ORDER — MONTELUKAST SODIUM 10 MG PO TABS: 10.0000 mg | ORAL_TABLET | Freq: Every day | ORAL | Status: DC

## 2016-02-19 ENCOUNTER — Other Ambulatory Visit: Payer: Self-pay | Admitting: *Deleted

## 2016-02-19 MED ORDER — OMEPRAZOLE 20 MG PO CPDR: 20.0000 mg | DELAYED_RELEASE_CAPSULE | Freq: Every day | ORAL | Status: DC

## 2016-02-19 MED ORDER — ISOSORBIDE MONONITRATE ER 60 MG PO TB24: 60.0000 mg | ORAL_TABLET | Freq: Every day | ORAL | Status: DC

## 2016-05-01 ENCOUNTER — Other Ambulatory Visit: Payer: Self-pay | Admitting: Family Medicine

## 2016-05-21 ENCOUNTER — Ambulatory Visit (INDEPENDENT_AMBULATORY_CARE_PROVIDER_SITE_OTHER): Payer: Medicare Other | Admitting: Family Medicine

## 2016-05-21 VITALS — BP 144/78 | HR 54 | Temp 98.2°F | Ht 72.0 in | Wt 213.8 lb

## 2016-05-21 DIAGNOSIS — Z7189 Other specified counseling: Secondary | ICD-10-CM | POA: Diagnosis not present

## 2016-05-21 DIAGNOSIS — I10 Essential (primary) hypertension: Secondary | ICD-10-CM

## 2016-05-21 DIAGNOSIS — M545 Low back pain: Secondary | ICD-10-CM

## 2016-05-21 DIAGNOSIS — I2510 Atherosclerotic heart disease of native coronary artery without angina pectoris: Secondary | ICD-10-CM | POA: Diagnosis not present

## 2016-05-21 DIAGNOSIS — G8929 Other chronic pain: Secondary | ICD-10-CM

## 2016-05-21 MED ORDER — HYDROCODONE-ACETAMINOPHEN 5-325 MG PO TABS: 1.0000 | ORAL_TABLET | Freq: Three times a day (TID) | ORAL | 0 refills | Status: DC | PRN

## 2016-05-21 NOTE — Assessment & Plan Note (Signed)
Slightly elevated systolic today. Last check was normal. After discussion with patient he would prefer to stay on his current medication dose.

## 2016-05-21 NOTE — Patient Instructions (Signed)
It was good to see you today!  We'll check blood work next visit  Refill for hydocodone today

## 2016-05-21 NOTE — Progress Notes (Signed)
Subjective:    Corey GASSETT Sr. is a 60 y.o. male who presents to [facility] today for chronic back pain:  1.  Chronic back pain:  Chronic issue for patient. Worse if he has to sit for long period of time or standing for long periods of time. Is doing some back strengthening exercises and using ice and heat for relief. Also takes hydrocodone use as needed for relief of his back pain. Last refill was in March 2017. No bladder bowel incontinence. No weakness or numbness of his legs.  2.  Hypertension:  Long-term problem for this patient.  No adverse effects from medication.  Not checking it regularly.  No HA, CP, dizziness, shortness of breath, palpitations, or LE swelling.   BP Readings from Last 3 Encounters:  05/21/16 (!) 144/78  11/28/15 124/77  08/07/15 (!) 147/74    ROS as above.   The following portions of the patient's history were reviewed and updated as appropriate: allergies, current medications, past medical history, family and social history, and problem list. Patient is a nonsmoker.    PMH reviewed.     Medications reviewed. Current Outpatient Prescriptions  Medication Sig Dispense Refill  . Acetaminophen (TYLENOL) 325 MG CAPS Take 1 capsule by mouth daily. Pt takes one a day for mild pain    . amitriptyline (ELAVIL) 50 MG tablet take 1 tablet by mouth at bedtime if needed for sleep 90 tablet 1  . aspirin 81 MG EC tablet Take 81 mg by mouth daily.      . busPIRone (BUSPAR) 30 MG tablet Take 1 tablet (30 mg total) by mouth 2 (two) times daily. 180 tablet 3  . cetirizine (ZYRTEC) 10 MG tablet Take 1 tablet (10 mg total) by mouth daily. 30 tablet 11  . cyclobenzaprine (FLEXERIL) 10 MG tablet take 1 tablet by mouth twice a day if needed muscle spasm 30 tablet 1  . fexofenadine (ALLEGRA) 60 MG tablet Take 1 tablet (60 mg total) by mouth daily as needed. For allergies (Patient not taking: Reported on 11/28/2015) 30 tablet 3  . gabapentin (NEURONTIN) 300 MG capsule Take 1 capsule (300  mg total) by mouth at bedtime. 30 capsule 2  . HYDROcodone-acetaminophen (NORCO) 5-325 MG tablet Take 1 tablet by mouth every 8 (eight) hours as needed. Do not fill until 60 days from this date 60 tablet 0  . HYDROcodone-acetaminophen (NORCO) 5-325 MG tablet Take 1 tablet by mouth every 8 (eight) hours as needed for moderate pain. Do not fill for 30 days 60 tablet 0  . HYDROcodone-acetaminophen (NORCO/VICODIN) 5-325 MG tablet Take 1 tablet by mouth every 8 (eight) hours as needed. 60 tablet 0  . isosorbide mononitrate (IMDUR) 60 MG 24 hr tablet Take 1 tablet (60 mg total) by mouth daily. 90 tablet 2  . metoprolol (LOPRESSOR) 100 MG tablet Take 1 tablet (100 mg total) by mouth 2 (two) times daily. 120 tablet 3  . montelukast (SINGULAIR) 10 MG tablet Take 1 tablet (10 mg total) by mouth at bedtime. 30 tablet 3  . NITROSTAT 0.4 MG SL tablet place 1 tablet under the tongue if needed every 5 minutes for chest pain for 3 doses IF NO RELIEF AFTER 3RD DOSE CALL PRESCRIBER OR 911. 25 tablet 1  . omeprazole (PRILOSEC) 20 MG capsule Take 1 capsule (20 mg total) by mouth daily. 90 capsule 1  . simvastatin (ZOCOR) 40 MG tablet take 1 tablet by mouth at bedtime 90 tablet 3  . traZODone (DESYREL) 50 MG  tablet Take 0.5-1 tablets (25-50 mg total) by mouth at bedtime. 90 tablet 2   No current facility-administered medications for this visit.      Objective:   Physical Exam BP (!) 150/96   Pulse (!) 54   Temp 98.2 F (36.8 C) (Oral)   Ht 6' (1.829 m)   Wt 213 lb 12.8 oz (97 kg)   BMI 29.00 kg/m  Gen:  Alert, cooperative patient who appears stated age in no acute distress.  Vital signs reviewed. HEENT: EOMI,  MMM Cardiac:  Regular rate and rhythm without murmur auscultated.  Good S1/S2. Pulm:  Clear to auscultation bilaterally with good air movement.  No wheezes or rales noted.   Back:  Normal skin, Spine with normal alignment and no deformity.  No tenderness to vertebral process palpation.  Paraspinous  muscles are minimally tender with some mild spasm noted bilateral lumbar region..   Range of motion is full at neck and slightly decreased forward flexion lumbar sacral regions.  Straight leg raise is positive BL for mild pain Neuro:  Sensation and motor function 5/5 bilateral lower extremities.  Patellar and Achilles  DTR's +2 patellar BL.  He is able to walk on his heels and toes without difficulty.     No results found for this or any previous visit (from the past 72 hour(s)).

## 2016-05-21 NOTE — Assessment & Plan Note (Addendum)
Doing well today.  Gets refills every 6 months or so for low back pain, chronic in nature.   Indication for chronic opioid: Chronic low back pain  Medication and dose: Norco 5/325 mg TID PRN # pills per month: 90 Last UDS date: n/a low risk Pain contract signed (Y/N): yes Date narcotic database last reviewed (include red flags): 05/21/16 no concerns

## 2016-05-21 NOTE — Assessment & Plan Note (Signed)
No chest pain currently or since last visit.

## 2016-06-20 ENCOUNTER — Other Ambulatory Visit: Payer: Self-pay | Admitting: Family Medicine

## 2016-07-22 ENCOUNTER — Other Ambulatory Visit: Payer: Self-pay | Admitting: Family Medicine

## 2016-07-26 ENCOUNTER — Other Ambulatory Visit: Payer: Self-pay | Admitting: Family Medicine

## 2016-09-12 ENCOUNTER — Other Ambulatory Visit: Payer: Self-pay | Admitting: Family Medicine

## 2016-10-01 ENCOUNTER — Other Ambulatory Visit: Payer: Self-pay | Admitting: Family Medicine

## 2016-10-03 ENCOUNTER — Other Ambulatory Visit: Payer: Self-pay | Admitting: Family Medicine

## 2016-10-04 ENCOUNTER — Other Ambulatory Visit: Payer: Self-pay | Admitting: Family Medicine

## 2016-10-21 ENCOUNTER — Other Ambulatory Visit: Payer: Self-pay | Admitting: Family Medicine

## 2016-11-05 ENCOUNTER — Ambulatory Visit (INDEPENDENT_AMBULATORY_CARE_PROVIDER_SITE_OTHER): Payer: Medicare Other | Admitting: Family Medicine

## 2016-11-05 ENCOUNTER — Other Ambulatory Visit: Payer: Self-pay | Admitting: Family Medicine

## 2016-11-05 VITALS — BP 132/80 | HR 67 | Temp 98.2°F | Ht 72.0 in | Wt 216.2 lb

## 2016-11-05 DIAGNOSIS — G8929 Other chronic pain: Secondary | ICD-10-CM

## 2016-11-05 DIAGNOSIS — I10 Essential (primary) hypertension: Secondary | ICD-10-CM | POA: Diagnosis not present

## 2016-11-05 DIAGNOSIS — F1010 Alcohol abuse, uncomplicated: Secondary | ICD-10-CM

## 2016-11-05 DIAGNOSIS — K219 Gastro-esophageal reflux disease without esophagitis: Secondary | ICD-10-CM

## 2016-11-05 DIAGNOSIS — I2510 Atherosclerotic heart disease of native coronary artery without angina pectoris: Secondary | ICD-10-CM | POA: Diagnosis not present

## 2016-11-05 DIAGNOSIS — Z9189 Other specified personal risk factors, not elsewhere classified: Secondary | ICD-10-CM | POA: Diagnosis not present

## 2016-11-05 DIAGNOSIS — Z1159 Encounter for screening for other viral diseases: Secondary | ICD-10-CM

## 2016-11-05 MED ORDER — HYDROCODONE-ACETAMINOPHEN 5-325 MG PO TABS: 1.0000 | ORAL_TABLET | Freq: Three times a day (TID) | ORAL | 0 refills | Status: DC | PRN

## 2016-11-05 NOTE — Assessment & Plan Note (Signed)
Treated for low back pain. Low risk for abuse or diversion. No benzodiazepine use. Refill today. His refills last him about 6 months in general.

## 2016-11-05 NOTE — Addendum Note (Signed)
Addended by: Londell Moh T on: 11/05/2016 02:52 PM   Modules accepted: Orders

## 2016-11-05 NOTE — Assessment & Plan Note (Signed)
Currently on both indoor and metoprolol. His had no chest pain for the past 6 months. No need for his nitroglycerin.

## 2016-11-05 NOTE — Assessment & Plan Note (Signed)
At goal today. No changes necessary.

## 2016-11-05 NOTE — Assessment & Plan Note (Signed)
Still drinking about 6 beers a day. Not interested in cutting back or quitting.

## 2016-11-05 NOTE — Progress Notes (Signed)
Subjective:    Corey THRAPP Sr. is a 61 y.o. male who presents to [facility] today for HTN:  1.  Hypertension:  Long-term problem for this patient.  No adverse effects from medication.  Not checking it regularly.  No HA, CP, dizziness, shortness of breath, palpitations, or LE swelling.  He has not taken nitroglycerin for the past 6 months. BP Readings from Last 3 Encounters:  11/05/16 132/80  05/21/16 (!) 144/78  11/28/15 124/77   2.  Back pain:  Chronic issue for patient.  He receives refills every 6 months for 3 month supply of hydrocodone. His never needed any early refills. Able to complete his ADLs with use of pain medicine. Back pain as are his bilateral lower back. He states that it radiates to his buttocks "every blue moon." No radiation of his leg. No lower extremity weakness. No bladder or bowel continence.  ROS as above per HPI.  Pertinently, no chest pain, palpitations, SOB, Fever, Chills, Abd pain, N/V/D.   The following portions of the patient's history were reviewed and updated as appropriate: allergies, current medications, past medical history, family and social history, and problem list. Patient is a nonsmoker.    PMH reviewed.  Past Medical History:  Diagnosis Date  . Allergy    Allergic rhinitis  . Angina pectoris ([place]) 2006   Known coronary vasospasm -- treated with combination of long-acting nitrates and calcium channel blockers; NTG when necessary  . CAD (coronary artery disease) 04/2005   Mild obstructive ramus intermedius disease (30-50%) and 50-60% proximal  . Chronic back pain    Stemming from prior lumbar diskectomy  . GERD (gastroesophageal reflux disease)   . Hyperlipidemia   . Hypertension   . Obesity   . Substance abuse    Known chronic alcoholic - not interested in quitting (current as of 2014)   Past Surgical History:  Procedure Laterality Date  . BACK SURGERY  1989, 1997   Chronic back pain stemming from this  . CARDIOVASCULAR STRESS TEST  2005   Dr. Daneen Schick -- Cardiolite stress test showed no evidence of ischemia  . COLONOSCOPY W/ BIOPSIES  2011   Multiple small polyps and s/p polypectomy -- recommended repeat in 2 years due to multiple small polyps  . ESOPHAGEAL DILATION  1997    Medications reviewed. Current Outpatient Prescriptions  Medication Sig Dispense Refill  . Acetaminophen (TYLENOL) 325 MG CAPS Take 1 capsule by mouth daily. Pt takes one a day for mild pain    . amitriptyline (ELAVIL) 50 MG tablet take 1 tablet by mouth at bedtime if needed for sleep 90 tablet 1  . aspirin 81 MG EC tablet Take 81 mg by mouth daily.      . busPIRone (BUSPAR) 30 MG tablet take 1 tablet by mouth twice a day 180 tablet 3  . cetirizine (ZYRTEC) 10 MG tablet Take 1 tablet (10 mg total) by mouth daily. 30 tablet 11  . cyclobenzaprine (FLEXERIL) 10 MG tablet take 1 tablet by mouth twice a day if needed muscle spasm 30 tablet 1  . fexofenadine (ALLEGRA) 60 MG tablet Take 1 tablet (60 mg total) by mouth daily as needed. For allergies (Patient not taking: Reported on 11/28/2015) 30 tablet 3  . gabapentin (NEURONTIN) 300 MG capsule Take 1 capsule (300 mg total) by mouth at bedtime. 30 capsule 2  . HYDROcodone-acetaminophen (NORCO) 5-325 MG tablet Take 1 tablet by mouth every 8 (eight) hours as needed. Do not fill until 60 days  from this date 60 tablet 0  . HYDROcodone-acetaminophen (NORCO) 5-325 MG tablet Take 1 tablet by mouth every 8 (eight) hours as needed for moderate pain. Do not fill for 30 days 60 tablet 0  . HYDROcodone-acetaminophen (NORCO/VICODIN) 5-325 MG tablet Take 1 tablet by mouth every 8 (eight) hours as needed. 60 tablet 0  . isosorbide mononitrate (IMDUR) 60 MG 24 hr tablet take 1 tablet by mouth once daily 90 tablet 2  . metoprolol (LOPRESSOR) 100 MG tablet take 1 tablet by mouth twice a day 120 tablet 3  . montelukast (SINGULAIR) 10 MG tablet take 1 tablet by mouth at bedtime 30 tablet 11  . nitroGLYCERIN (NITROSTAT) 0.4 MG SL  tablet place 1 tablet under the tongue if needed every 5 minutes for chest pain for 3 doses IF NO RELIEF AFTER 3RD DOSE CALL PRESCRIBER OR 911. 25 tablet 1  . omeprazole (PRILOSEC) 20 MG capsule take 1 capsule by mouth once daily 90 capsule 1  . simvastatin (ZOCOR) 40 MG tablet take 1 tablet by mouth at bedtime 90 tablet 3  . traZODone (DESYREL) 50 MG tablet take 1/2 to 1 tablet by mouth at bedtime 90 tablet 2   No current facility-administered medications for this visit.      Objective:   Physical Exam BP 132/80   Pulse 67   Temp 98.2 F (36.8 C) (Oral)   Ht 6' (1.829 m)   Wt 216 lb 3.2 oz (98.1 kg)   SpO2 99%   BMI 29.32 kg/m  Gen:  Alert, cooperative patient who appears stated age in no acute distress.  Vital signs reviewed. HEENT: EOMI,  MMM. Upper dentures in place. Fair hygiene lower mandible teeth Cardiac:  Regular rate and rhythm without murmur auscultated.  Good S1/S2. Pulm:  Clear to auscultation bilaterally with good air movement.  No wheezes or rales noted.   Abd:  Soft/nondistended/nontender.   Exts: Non edematous BL  LE, warm and well perfused.   No results found for this or any previous visit (from the past 72 hour(s)).

## 2016-11-05 NOTE — Assessment & Plan Note (Signed)
Difficulty tolerating any time off of his PPI. He has trouble with rebound reflux with stopping PPI. After risks benefits and elected to continue with PPI for now.

## 2016-11-05 NOTE — Patient Instructions (Signed)
It was good to see you again today.  Things look good from my standpoint.  We are checking labs today and I'll let you know the results.  Come back and see me in about 6 months.

## 2016-11-06 LAB — CBC WITH DIFFERENTIAL/PLATELET
Basophils Absolute: 0 10*3/uL (ref 0.0–0.2)
Basos: 0 %
EOS (ABSOLUTE): 0.2 10*3/uL (ref 0.0–0.4)
EOS: 3 %
HEMATOCRIT: 44.1 % (ref 37.5–51.0)
HEMOGLOBIN: 15 g/dL (ref 13.0–17.7)
IMMATURE GRANS (ABS): 0 10*3/uL (ref 0.0–0.1)
Immature Granulocytes: 0 %
LYMPHS ABS: 1.5 10*3/uL (ref 0.7–3.1)
LYMPHS: 26 %
MCH: 33.8 pg — AB (ref 26.6–33.0)
MCHC: 34 g/dL (ref 31.5–35.7)
MCV: 99 fL — AB (ref 79–97)
MONOCYTES: 9 %
Monocytes Absolute: 0.5 10*3/uL (ref 0.1–0.9)
NEUTROS ABS: 3.5 10*3/uL (ref 1.4–7.0)
Neutrophils: 62 %
Platelets: 154 10*3/uL (ref 150–379)
RBC: 4.44 x10E6/uL (ref 4.14–5.80)
RDW: 14.2 % (ref 12.3–15.4)
WBC: 5.6 10*3/uL (ref 3.4–10.8)

## 2016-11-06 LAB — COMPREHENSIVE METABOLIC PANEL
A/G RATIO: 2.2 (ref 1.2–2.2)
ALBUMIN: 4.8 g/dL (ref 3.6–4.8)
ALK PHOS: 57 IU/L (ref 39–117)
ALT: 29 IU/L (ref 0–44)
AST: 30 IU/L (ref 0–40)
BILIRUBIN TOTAL: 0.4 mg/dL (ref 0.0–1.2)
BUN/Creatinine Ratio: 10 (ref 10–24)
BUN: 10 mg/dL (ref 8–27)
CHLORIDE: 99 mmol/L (ref 96–106)
CO2: 23 mmol/L (ref 18–29)
Calcium: 9.9 mg/dL (ref 8.6–10.2)
Creatinine, Ser: 1 mg/dL (ref 0.76–1.27)
GFR calc Af Amer: 94 mL/min/{1.73_m2} (ref >59)
GFR calc non Af Amer: 81 mL/min/{1.73_m2} (ref >59)
GLOBULIN, TOTAL: 2.2 g/dL (ref 1.5–4.5)
GLUCOSE: 98 mg/dL (ref 65–99)
Potassium: 4.6 mmol/L (ref 3.5–5.2)
SODIUM: 143 mmol/L (ref 134–144)
TOTAL PROTEIN: 7 g/dL (ref 6.0–8.5)

## 2016-11-06 LAB — TSH: TSH: 5.37 u[IU]/mL — AB (ref 0.450–4.500)

## 2016-11-06 LAB — LIPID PANEL
Chol/HDL Ratio: 3.3 ratio units (ref 0.0–5.0)
Cholesterol, Total: 137 mg/dL (ref 100–199)
HDL: 42 mg/dL (ref >39)
LDL Calculated: 42 mg/dL (ref 0–99)
Triglycerides: 263 mg/dL — ABNORMAL HIGH (ref 0–149)
VLDL CHOLESTEROL CAL: 53 mg/dL — AB (ref 5–40)

## 2016-11-06 LAB — HEPATITIS C ANTIBODY: Hep C Virus Ab: <0.1 s/co ratio (ref 0.0–0.9)

## 2016-11-08 ENCOUNTER — Other Ambulatory Visit: Payer: Self-pay | Admitting: Family Medicine

## 2016-11-11 ENCOUNTER — Encounter: Payer: Self-pay | Admitting: Family Medicine

## 2017-01-06 ENCOUNTER — Other Ambulatory Visit: Payer: Self-pay | Admitting: Family Medicine

## 2017-01-27 ENCOUNTER — Other Ambulatory Visit: Payer: Self-pay | Admitting: Family Medicine

## 2017-05-23 ENCOUNTER — Ambulatory Visit (INDEPENDENT_AMBULATORY_CARE_PROVIDER_SITE_OTHER): Payer: Medicare Other | Admitting: Family Medicine

## 2017-05-23 VITALS — BP 108/74 | HR 56 | Temp 98.3°F | Ht 72.0 in | Wt 214.2 lb

## 2017-05-23 DIAGNOSIS — I7300 Raynaud's syndrome without gangrene: Secondary | ICD-10-CM | POA: Diagnosis not present

## 2017-05-23 DIAGNOSIS — M25541 Pain in joints of right hand: Secondary | ICD-10-CM

## 2017-05-23 DIAGNOSIS — G8929 Other chronic pain: Secondary | ICD-10-CM

## 2017-05-23 DIAGNOSIS — Z202 Contact with and (suspected) exposure to infections with a predominantly sexual mode of transmission: Secondary | ICD-10-CM | POA: Diagnosis not present

## 2017-05-23 DIAGNOSIS — M25542 Pain in joints of left hand: Secondary | ICD-10-CM

## 2017-05-23 MED ORDER — HYDROCODONE-ACETAMINOPHEN 5-325 MG PO TABS: 1.0000 | ORAL_TABLET | Freq: Three times a day (TID) | ORAL | 0 refills | Status: DC | PRN

## 2017-05-23 MED ORDER — LORATADINE 10 MG PO CAPS: ORAL_CAPSULE | ORAL | 11 refills | Status: DC

## 2017-05-23 NOTE — Assessment & Plan Note (Signed)
Present BL hands, mostly on left.  Associated with pain and joint stiffness.  Not clear-cut primary Raynaud's based on age.  TSH slightly off last visit.  Rechecking that plus other autoimmune labs today.  If primary, treat with CCB's.  Await results of labs.

## 2017-05-23 NOTE — Assessment & Plan Note (Signed)
Refill for his chronic norco today.  This helps him complete his activities of daily living.  Low concern for diversion or abuse.  Continue heat and massage as he has been.

## 2017-05-23 NOTE — Progress Notes (Signed)
Subjective:    Corey SCHEIER Sr. is a 61 y.o. male who presents to [facility] today for chronic back pain:  1.  Chronic back pain:  Fairly stable.  Has been more active because it is summer, and so a little worse than usual past few weeks.  Controlled with intermittent narcotics.  Also occasional muscle relaxer use.  No Bladder/bowel incontinence.    2.  Hand pain and color changes: New problem for patient for past several weeks.  States his hands become swollen first thing in the morning.  Has noticed darkening of hands around his MCP joints.  Takes much of the AM for his hands to "warm up" and not be painful.  No injury to hands.  No fevers/chills.  No recent illnesses.  No other arthralgias.  No LE edema.    ROS as above per HPI.   The following portions of the patient's history were reviewed and updated as appropriate: allergies, current medications, past medical history, family and social history, and problem list. Patient is a nonsmoker.    PMH reviewed.  Past Medical History:  Diagnosis Date  . Allergy    Allergic rhinitis  . Angina pectoris ([place]) 2006   Known coronary vasospasm -- treated with combination of long-acting nitrates and calcium channel blockers; NTG when necessary  . CAD (coronary artery disease) 04/2005   Mild obstructive ramus intermedius disease (30-50%) and 50-60% proximal  . Chronic back pain    Stemming from prior lumbar diskectomy  . GERD (gastroesophageal reflux disease)   . Hyperlipidemia   . Hypertension   . Obesity   . Substance abuse    Known chronic alcoholic - not interested in quitting (current as of 2014)   Past Surgical History:  Procedure Laterality Date  . BACK SURGERY  1989, 1997   Chronic back pain stemming from this  . CARDIOVASCULAR STRESS TEST  2005   Dr. Daneen Schick -- Cardiolite stress test showed no evidence of ischemia  . COLONOSCOPY W/ BIOPSIES  2011   Multiple small polyps and s/p polypectomy -- recommended repeat in 2 years due to  multiple small polyps  . ESOPHAGEAL DILATION  1997    Medications reviewed.    Objective:   Physical Exam BP 108/74   Pulse (!) 56   Temp 98.3 F (36.8 C) (Oral)   Ht 6' (1.829 m)   Wt 214 lb 3.2 oz (97.2 kg)   SpO2 97%   BMI 29.05 kg/m  Gen:  Alert, cooperative patient who appears stated age in no acute distress.  Vital signs reviewed. HEENT: EOMI,  MMM Cardiac:  Regular rate and rhythm without murmur auscultated.  Good S1/S2. Pulm:  Clear to auscultation bilaterally with good air movement.  No wheezes or rales noted.   Exts: Non edematous BL  LE, warm and well perfused.  MSK:  Mild swelling all MCP joints BL Skin:  Has white-appearing fingers distal to PIP joint 2nd - 5th digits on Left hand, and 1st digit of Right hand.    No results found for this or any previous visit (from the past 72 hour(s)).

## 2017-05-23 NOTE — Patient Instructions (Signed)
It was good to see you again today.  We are checking some labs for your hands and I'll let you know the results.

## 2017-05-24 LAB — CBC WITH DIFFERENTIAL/PLATELET
BASOS: 0 %
Basophils Absolute: 0 10*3/uL (ref 0.0–0.2)
EOS (ABSOLUTE): 0.2 10*3/uL (ref 0.0–0.4)
Eos: 3 %
Hematocrit: 44.2 % (ref 37.5–51.0)
Hemoglobin: 14.5 g/dL (ref 13.0–17.7)
Immature Grans (Abs): 0 10*3/uL (ref 0.0–0.1)
Immature Granulocytes: 0 %
LYMPHS ABS: 1.3 10*3/uL (ref 0.7–3.1)
Lymphs: 27 %
MCH: 33.7 pg — AB (ref 26.6–33.0)
MCHC: 32.8 g/dL (ref 31.5–35.7)
MCV: 103 fL — AB (ref 79–97)
MONOS ABS: 0.4 10*3/uL (ref 0.1–0.9)
Monocytes: 7 %
NEUTROS ABS: 3 10*3/uL (ref 1.4–7.0)
Neutrophils: 63 %
PLATELETS: 136 10*3/uL — AB (ref 150–379)
RBC: 4.3 x10E6/uL (ref 4.14–5.80)
RDW: 15.1 % (ref 12.3–15.4)
WBC: 4.7 10*3/uL (ref 3.4–10.8)

## 2017-05-24 LAB — COMPREHENSIVE METABOLIC PANEL
ALT: 28 IU/L (ref 0–44)
AST: 28 IU/L (ref 0–40)
Albumin/Globulin Ratio: 2 (ref 1.2–2.2)
Albumin: 4.8 g/dL (ref 3.6–4.8)
Alkaline Phosphatase: 58 IU/L (ref 39–117)
BUN / CREAT RATIO: 13 (ref 10–24)
BUN: 13 mg/dL (ref 8–27)
Bilirubin Total: 0.6 mg/dL (ref 0.0–1.2)
CALCIUM: 9.8 mg/dL (ref 8.6–10.2)
CO2: 28 mmol/L (ref 20–29)
Chloride: 99 mmol/L (ref 96–106)
Creatinine, Ser: 1 mg/dL (ref 0.76–1.27)
GFR, EST AFRICAN AMERICAN: 93 mL/min/{1.73_m2} (ref >59)
GFR, EST NON AFRICAN AMERICAN: 81 mL/min/{1.73_m2} (ref >59)
GLOBULIN, TOTAL: 2.4 g/dL (ref 1.5–4.5)
Glucose: 113 mg/dL — ABNORMAL HIGH (ref 65–99)
POTASSIUM: 4.8 mmol/L (ref 3.5–5.2)
Sodium: 141 mmol/L (ref 134–144)
Total Protein: 7.2 g/dL (ref 6.0–8.5)

## 2017-05-24 LAB — TSH: TSH: 3.2 u[IU]/mL (ref 0.450–4.500)

## 2017-05-24 LAB — HEPATITIS PANEL, ACUTE
HEP B C IGM: NEGATIVE
Hep A IgM: NEGATIVE
Hep C Virus Ab: <0.1 s/co ratio (ref 0.0–0.9)
Hepatitis B Surface Ag: NEGATIVE

## 2017-05-24 LAB — RHEUMATOID FACTOR: Rhuematoid fact SerPl-aCnc: <10 IU/mL (ref 0.0–13.9)

## 2017-05-24 LAB — ANA: Anti Nuclear Antibody(ANA): NEGATIVE

## 2017-05-27 ENCOUNTER — Encounter: Payer: Self-pay | Admitting: Family Medicine

## 2017-06-03 ENCOUNTER — Other Ambulatory Visit: Payer: Self-pay | Admitting: Family Medicine

## 2017-06-06 ENCOUNTER — Telehealth: Payer: Self-pay | Admitting: *Deleted

## 2017-06-06 MED ORDER — PRAVASTATIN SODIUM 40 MG PO TABS: 40.0000 mg | ORAL_TABLET | Freq: Every day | ORAL | 1 refills | Status: DC

## 2017-06-06 MED ORDER — AMLODIPINE BESYLATE 5 MG PO TABS: 5.0000 mg | ORAL_TABLET | Freq: Every day | ORAL | 1 refills | Status: DC

## 2017-06-06 NOTE — Telephone Encounter (Signed)
Called and discussed results with wife.  She states they had never received the letter with his results.  Discussed normal results.  Patient still with pain.  Will start low-dose norvasc to help and increase as needed to treat presumed Reynaud's phenomenon.  Wife appreciative of call.

## 2017-06-06 NOTE — Telephone Encounter (Signed)
Patient's wife called wanting to speak with PCP regarding lab results from last office visit. Number on file is correct.  Derl Barrow, RN

## 2017-06-06 NOTE — Addendum Note (Signed)
Addended byMingo Amber, Margarita Bobrowski H on: 06/06/2017 04:06 PM   Modules accepted: Orders

## 2017-06-11 ENCOUNTER — Telehealth: Payer: Self-pay | Admitting: *Deleted

## 2017-06-11 NOTE — Telephone Encounter (Signed)
Patient's wife left message on nurse line requesting to speak with PCP regarding medications that were prescribed at last office visit. Please give her a call; number on file is correct.  Derl Barrow, RN

## 2017-06-12 ENCOUNTER — Encounter: Payer: Self-pay | Admitting: *Deleted

## 2017-06-12 NOTE — Telephone Encounter (Signed)
Patient had a question about being started on the pravastatin and stopping Simvastatin.  Discussed that Norvasc is a new medicine for Reynaud's and he shouldn't take this plus simvastatin, thus the new prescription of pravastatin.  Wife expressed understanding and will discard old simvastatin.

## 2017-06-19 ENCOUNTER — Other Ambulatory Visit: Payer: Self-pay | Admitting: Family Medicine

## 2017-07-05 ENCOUNTER — Other Ambulatory Visit: Payer: Self-pay | Admitting: Family Medicine

## 2017-08-12 ENCOUNTER — Other Ambulatory Visit: Payer: Self-pay | Admitting: *Deleted

## 2017-08-12 MED ORDER — AMLODIPINE BESYLATE 5 MG PO TABS: 5.0000 mg | ORAL_TABLET | Freq: Every day | ORAL | 1 refills | Status: DC

## 2017-10-06 ENCOUNTER — Other Ambulatory Visit: Payer: Self-pay

## 2017-10-06 MED ORDER — AMLODIPINE BESYLATE 5 MG PO TABS: 5.0000 mg | ORAL_TABLET | Freq: Every day | ORAL | 1 refills | Status: DC

## 2017-10-24 ENCOUNTER — Other Ambulatory Visit: Payer: Self-pay

## 2017-10-24 ENCOUNTER — Ambulatory Visit (INDEPENDENT_AMBULATORY_CARE_PROVIDER_SITE_OTHER): Payer: Medicare Other | Admitting: Family Medicine

## 2017-10-24 ENCOUNTER — Encounter: Payer: Self-pay | Admitting: Family Medicine

## 2017-10-24 DIAGNOSIS — G8929 Other chronic pain: Secondary | ICD-10-CM

## 2017-10-24 DIAGNOSIS — F1010 Alcohol abuse, uncomplicated: Secondary | ICD-10-CM | POA: Diagnosis not present

## 2017-10-24 DIAGNOSIS — I2510 Atherosclerotic heart disease of native coronary artery without angina pectoris: Secondary | ICD-10-CM

## 2017-10-24 DIAGNOSIS — I7300 Raynaud's syndrome without gangrene: Secondary | ICD-10-CM | POA: Diagnosis not present

## 2017-10-24 MED ORDER — HYDROCODONE-ACETAMINOPHEN 5-325 MG PO TABS: 1.0000 | ORAL_TABLET | Freq: Three times a day (TID) | ORAL | 0 refills | Status: DC | PRN

## 2017-10-24 NOTE — Assessment & Plan Note (Signed)
Now down to 2 beers a day in attempts to lose weight.

## 2017-10-24 NOTE — Assessment & Plan Note (Signed)
Still with pain.  He continues take his amlodipine which is only somewhat helping.  I provided him with some information on Raynaud's. He has had negative testing for any other systemic issues.

## 2017-10-24 NOTE — Assessment & Plan Note (Signed)
Doing well with no chest pain

## 2017-10-24 NOTE — Assessment & Plan Note (Signed)
Doing well without concerns.  Refills every 3-6 months.  Now for back pain plus fingers secondary to Raynaud's.  Refills provided today.

## 2017-10-24 NOTE — Patient Instructions (Signed)
It was good to see you today.  I have refilled your pain medicines.  Let me know if anything is causing any issues for you.  Otherwise I'll see you back in 6 months.  I've included some info on Raynaud's below.    Raynaud Phenomenon Raynaud phenomenon is a condition that affects the blood vessels (arteries) that carry blood to your fingers and toes. The arteries that supply blood to your ears or the tip of your nose might also be affected. Raynaud phenomenon causes the arteries to temporarily narrow. As a result, the flow of blood to the affected areas is temporarily decreased. This usually occurs in response to cold temperatures or stress. During an attack, the skin in the affected areas turns white. You may also feel tingling or numbness in those areas. Attacks usually last for only a brief period, and then the blood flow to the area returns to normal. In most cases, Raynaud phenomenon does not cause serious health problems. What are the causes? For many people with this condition, the cause is not known. Raynaud phenomenon is sometimes associated with other diseases, such as scleroderma or lupus. What increases the risk? Raynaud phenomenon can affect anyone, but it develops most often in people who are 40-50 years old. What are the signs or symptoms? Symptoms of Raynaud phenomenon may occur when you are exposed to cold temperatures or when you have emotional stress. The symptoms may last for a few minutes or up to several hours. They usually affect your fingers but may also affect your toes, ears, or the tip of your nose. Symptoms may include:  Changes in skin color. The skin in the affected areas will turn pale or white. The skin may then change from white to bluish to red as normal blood flow returns to the area.  Numbness, tingling, or pain in the affected areas.  In severe cases, sores may develop in the affected areas. How is this diagnosed? Your health care provider will do a  physical exam and take your medical history. You may be asked to put your hands in cold water to check for a reaction to cold temperature. Blood tests may be done to check for other diseases or conditions.   How is this treated? Treatment often involves making lifestyle changes and taking steps to control your exposure to cold temperatures. For more severe cases, medicine (calcium channel blockers) may be used to improve blood flow. Surgery is sometimes done to block the nerves that control the affected arteries, but this is rare. Follow these instructions at home:  Avoid exposure to cold by taking these steps: ? If possible, stay indoors during cold weather. ? When you go outside during cold weather, dress in layers and wear mittens, a hat, a scarf, and warm footwear. ? Wear mittens or gloves when handling ice or frozen food. ? Use holders for glasses or cans containing cold drinks. ? Let warm water run for a while before taking a shower or bath. ? Warm up the car before driving in cold weather. I

## 2017-10-24 NOTE — Progress Notes (Signed)
Subjective:    Corey BLAYLOCK Sr. is a 62 y.o. male who presents to [facility] today for chronic pain and cold symptoms:  1.  Cold sx's: Recent cough and URI symptoms.  Present for about a week.  Feels it is gradually improving.  Multiple people in the house have also been sick.  No nausea or vomiting.  No fevers chills.  Cough is nonproductive.  2.  Chronic pain:  Mix of back pain and now BL hand pain.  Low back pain and stable.  No bladder or bowel incontinence.  No radiation to bilateral legs.  Hand pain secondary to ray nods.  Worse in the a.m. when he awakens.  Fingers are either blue or white.  This improved since the day continues.  Denies any other joint aches.  He is taking his amlodipine as prescribed.  Also hydrocodone as needed.  Last refill was September.  No falls.  No worsening.   ROS as above per HPI.  Pertinently, no chest pain, palpitations, SOB, Fever, Chills, Abd pain, N/V/D.   The following portions of the patient's history were reviewed and updated as appropriate: allergies, current medications, past medical history, family and social history, and problem list. Patient is a nonsmoker.    PMH reviewed.  Past Medical History:  Diagnosis Date  . Allergy    Allergic rhinitis  . Angina pectoris ([place]) 2006   Known coronary vasospasm -- treated with combination of long-acting nitrates and calcium channel blockers; NTG when necessary  . CAD (coronary artery disease) 04/2005   Mild obstructive ramus intermedius disease (30-50%) and 50-60% proximal  . Chronic back pain    Stemming from prior lumbar diskectomy  . GERD (gastroesophageal reflux disease)   . Hyperlipidemia   . Hypertension   . Obesity   . Substance abuse ([place])    Known chronic alcoholic - not interested in quitting (current as of 2014)   Past Surgical History:  Procedure Laterality Date  . BACK SURGERY  1989, 1997   Chronic back pain stemming from this  . CARDIOVASCULAR STRESS TEST  2005   Dr. Daneen Schick --  Cardiolite stress test showed no evidence of ischemia  . COLONOSCOPY W/ BIOPSIES  2011   Multiple small polyps and s/p polypectomy -- recommended repeat in 2 years due to multiple small polyps  . ESOPHAGEAL DILATION  1997    Medications reviewed. Current Outpatient Medications  Medication Sig Dispense Refill  . Acetaminophen (TYLENOL) 325 MG CAPS Take 1 capsule by mouth daily. Pt takes one a day for mild pain    . amitriptyline (ELAVIL) 50 MG tablet take 1 tablet by mouth at bedtime if needed for sleep 90 tablet 1  . amLODipine (NORVASC) 5 MG tablet Take 1 tablet (5 mg total) by mouth daily. 90 tablet 1  . aspirin 81 MG EC tablet Take 81 mg by mouth daily.      . busPIRone (BUSPAR) 30 MG tablet take 1 tablet by mouth twice a day 180 tablet 3  . cyclobenzaprine (FLEXERIL) 10 MG tablet take 1 tablet by mouth twice a day if needed muscle spasm 30 tablet 1  . fexofenadine (ALLEGRA) 60 MG tablet Take 1 tablet (60 mg total) by mouth daily as needed. For allergies (Patient not taking: Reported on 11/28/2015) 30 tablet 3  . gabapentin (NEURONTIN) 300 MG capsule Take 1 capsule (300 mg total) by mouth at bedtime. 30 capsule 2  . HYDROcodone-acetaminophen (NORCO) 5-325 MG tablet Take 1 tablet by mouth every  8 (eight) hours as needed. Do not fill until 60 days from this date 60 tablet 0  . HYDROcodone-acetaminophen (NORCO) 5-325 MG tablet Take 1 tablet by mouth every 8 (eight) hours as needed for moderate pain. Do not fill for 30 days 60 tablet 0  . HYDROcodone-acetaminophen (NORCO/VICODIN) 5-325 MG tablet Take 1 tablet by mouth every 8 (eight) hours as needed. 60 tablet 0  . isosorbide mononitrate (IMDUR) 60 MG 24 hr tablet take 1 tablet by mouth once daily 90 tablet 2  . Loratadine (CLARITIN) 10 MG CAPS Take 1 tab po daily 30 each 11  . metoprolol tartrate (LOPRESSOR) 100 MG tablet take 1 tablet by mouth twice a day 120 tablet 3  . montelukast (SINGULAIR) 10 MG tablet take 1 tablet by mouth at bedtime 30  tablet 11  . nitroGLYCERIN (NITROSTAT) 0.4 MG SL tablet place 1 tablet under the tongue if needed every 5 minutes for chest pain for 3 doses IF NO RELIEF AFTER 3RD DOSE CALL PRESCRIBER OR 911. 25 tablet 1  . omeprazole (PRILOSEC) 20 MG capsule take 1 capsule by mouth once daily 90 capsule 1  . pravastatin (PRAVACHOL) 40 MG tablet Take 1 tablet (40 mg total) by mouth daily. 90 tablet 1  . RA ALLERGY RELIEF 10 MG tablet take 1 tablet by mouth once daily 30 tablet 11  . traZODone (DESYREL) 50 MG tablet take 1/2 to 1 tablet by mouth at bedtime 90 tablet 2   No current facility-administered medications for this visit.      Objective:   Physical Exam BP 112/60   Pulse (!) 55   Temp 98.5 F (36.9 C) (Oral)   Ht 6' (1.829 m)   Wt 212 lb (96.2 kg)   SpO2 98%   BMI 28.75 kg/m  Gen:  Alert, cooperative patient who appears stated age in no acute distress.  Vital signs reviewed. HEENT: EOMI,  MMM Cardiac:  Regular rate and rhythm without murmur auscultated.  Good S1/S2. Pulm:  Clear to auscultation bilaterally with good air movement.  No wheezes or rales noted.   Abd:  Soft/nondistended/nontender.  Good bowel sounds throughout all four quadrants.  No masses noted.  Exts: Non edematous BL  LE, warm and well perfused. Hands without evidence Raynaud's today.   MSK:  Back nontender on exam currently.  Difficulty bending forward secondary to back stiffness and pain.  No results found for this or any previous visit (from the past 72 hour(s)).

## 2017-10-31 ENCOUNTER — Other Ambulatory Visit: Payer: Self-pay

## 2017-10-31 MED ORDER — TRAZODONE HCL 50 MG PO TABS: 25.0000 mg | ORAL_TABLET | Freq: Every day | ORAL | 2 refills | Status: DC

## 2017-11-20 ENCOUNTER — Other Ambulatory Visit: Payer: Self-pay

## 2017-11-20 MED ORDER — CYCLOBENZAPRINE HCL 10 MG PO TABS: ORAL_TABLET | ORAL | 1 refills | Status: DC

## 2017-12-11 ENCOUNTER — Other Ambulatory Visit: Payer: Self-pay

## 2017-12-11 NOTE — Telephone Encounter (Signed)
Pt requesting refill of pravastatin. Call back [phone] Wallace Cullens, RN

## 2017-12-12 MED ORDER — PRAVASTATIN SODIUM 40 MG PO TABS: 40.0000 mg | ORAL_TABLET | Freq: Every day | ORAL | 1 refills | Status: DC

## 2017-12-18 DIAGNOSIS — M722 Plantar fascial fibromatosis: Secondary | ICD-10-CM | POA: Diagnosis not present

## 2017-12-18 DIAGNOSIS — M7732 Calcaneal spur, left foot: Secondary | ICD-10-CM | POA: Diagnosis not present

## 2017-12-18 DIAGNOSIS — M79672 Pain in left foot: Secondary | ICD-10-CM | POA: Diagnosis not present

## 2017-12-18 DIAGNOSIS — M71572 Other bursitis, not elsewhere classified, left ankle and foot: Secondary | ICD-10-CM | POA: Diagnosis not present

## 2018-01-05 DIAGNOSIS — M722 Plantar fascial fibromatosis: Secondary | ICD-10-CM | POA: Diagnosis not present

## 2018-01-05 DIAGNOSIS — M71572 Other bursitis, not elsewhere classified, left ankle and foot: Secondary | ICD-10-CM | POA: Diagnosis not present

## 2018-02-03 ENCOUNTER — Other Ambulatory Visit: Payer: Self-pay | Admitting: Family Medicine

## 2018-02-25 ENCOUNTER — Other Ambulatory Visit: Payer: Self-pay | Admitting: Family Medicine

## 2018-03-10 ENCOUNTER — Other Ambulatory Visit: Payer: Self-pay | Admitting: *Deleted

## 2018-03-10 NOTE — Telephone Encounter (Signed)
Pt accidentally washed his medications and needs a refill. Corey Watts, [name], CMA

## 2018-03-11 MED ORDER — NITROGLYCERIN 0.4 MG SL SUBL: SUBLINGUAL_TABLET | SUBLINGUAL | 1 refills | Status: DC

## 2018-03-21 ENCOUNTER — Other Ambulatory Visit: Payer: Self-pay | Admitting: Family Medicine

## 2018-03-25 ENCOUNTER — Other Ambulatory Visit: Payer: Self-pay | Admitting: Family Medicine

## 2018-03-29 ENCOUNTER — Other Ambulatory Visit: Payer: Self-pay | Admitting: Family Medicine

## 2018-04-01 ENCOUNTER — Other Ambulatory Visit: Payer: Self-pay | Admitting: Family Medicine

## 2018-04-03 ENCOUNTER — Other Ambulatory Visit: Payer: Self-pay | Admitting: Family Medicine

## 2018-04-06 ENCOUNTER — Other Ambulatory Visit: Payer: Self-pay | Admitting: Family Medicine

## 2018-04-07 ENCOUNTER — Other Ambulatory Visit: Payer: Self-pay | Admitting: Family Medicine

## 2018-04-20 ENCOUNTER — Other Ambulatory Visit: Payer: Self-pay | Admitting: Family Medicine

## 2018-05-26 ENCOUNTER — Ambulatory Visit (INDEPENDENT_AMBULATORY_CARE_PROVIDER_SITE_OTHER): Payer: Medicare Other | Admitting: Family Medicine

## 2018-05-26 ENCOUNTER — Other Ambulatory Visit: Payer: Self-pay

## 2018-05-26 ENCOUNTER — Encounter: Payer: Self-pay | Admitting: Family Medicine

## 2018-05-26 VITALS — BP 118/82 | HR 52 | Temp 98.3°F | Ht 72.0 in | Wt 215.2 lb

## 2018-05-26 DIAGNOSIS — J309 Allergic rhinitis, unspecified: Secondary | ICD-10-CM | POA: Diagnosis not present

## 2018-05-26 DIAGNOSIS — G8929 Other chronic pain: Secondary | ICD-10-CM

## 2018-05-26 DIAGNOSIS — I10 Essential (primary) hypertension: Secondary | ICD-10-CM | POA: Diagnosis not present

## 2018-05-26 DIAGNOSIS — I2510 Atherosclerotic heart disease of native coronary artery without angina pectoris: Secondary | ICD-10-CM

## 2018-05-26 DIAGNOSIS — Z0000 Encounter for general adult medical examination without abnormal findings: Secondary | ICD-10-CM | POA: Diagnosis not present

## 2018-05-26 MED ORDER — HYDROCODONE-ACETAMINOPHEN 5-325 MG PO TABS: 1.0000 | ORAL_TABLET | Freq: Three times a day (TID) | ORAL | 0 refills | Status: DC | PRN

## 2018-05-26 MED ORDER — MOMETASONE FUROATE 50 MCG/ACT NA SUSP: 2.0000 | Freq: Every day | NASAL | 2 refills | Status: DC

## 2018-05-26 NOTE — Patient Instructions (Signed)
It was good to see you again today.  If you begin having persistent chest pains, let me know.    Use the Nasonex to help with the allergies and congestion.

## 2018-05-26 NOTE — Progress Notes (Signed)
Corey Ranch Sr. is a 62 y.o. male who presents to [facility] today for a comprehensive physical examination:  CPE  Concerns: Seasonal allergies have worsened.  This he is taking with Allegra and loratadine without any effects.  He is tried Flonase in the past without much relief and also caused a nosebleed.  He is asking for something else for relief.  No recent illnesses.  No fevers or chills.  Last physical last year Tetanus up-to-date Flu vaccine declined Eye exam: N/A Dental exam every six months.   PMH reviewed. Patient is a nonsmoker.   Past Medical History:  Diagnosis Date  . Allergy    Allergic rhinitis  . Angina pectoris ([place]) 2006   Known coronary vasospasm -- treated with combination of long-acting nitrates and calcium channel blockers; NTG when necessary  . CAD (coronary artery disease) 04/2005   Mild obstructive ramus intermedius disease (30-50%) and 50-60% proximal  . Chronic back pain    Stemming from prior lumbar diskectomy  . GERD (gastroesophageal reflux disease)   . Hyperlipidemia   . Hypertension   . Obesity   . Substance abuse ([place])    Known chronic alcoholic - not interested in quitting (current as of 2014)   Past Surgical History:  Procedure Laterality Date  . BACK SURGERY  1989, 1997   Chronic back pain stemming from this  . CARDIOVASCULAR STRESS TEST  2005   Dr. Daneen Schick -- Cardiolite stress test showed no evidence of ischemia  . COLONOSCOPY W/ BIOPSIES  2011   Multiple small polyps and s/p polypectomy -- recommended repeat in 2 years due to multiple small polyps  . ESOPHAGEAL DILATION  1997    Medications reviewed. Current Outpatient Medications  Medication Sig Dispense Refill  . Acetaminophen (TYLENOL) 325 MG CAPS Take 1 capsule by mouth daily. Pt takes one a day for mild pain    . amitriptyline (ELAVIL) 50 MG tablet TAKE 1 TABLET BY MOUTH AT BEDTIME IF NEEDED FOR SLEEP 90 tablet 1  . amLODipine (NORVASC) 5 MG tablet TAKE 1  TABLET BY MOUTH ONCE DAILY 90 tablet 2  . aspirin 81 MG EC tablet Take 81 mg by mouth daily.      . busPIRone (BUSPAR) 30 MG tablet take 1 tablet by mouth twice a day 180 tablet 3  . cetirizine (ZYRTEC) 10 MG tablet TAKE 1 TABLET BY MOUTH ONCE DAILY 90 tablet 2  . cyclobenzaprine (FLEXERIL) 10 MG tablet take 1 tablet by mouth twice a day if needed muscle spasm 30 tablet 1  . fexofenadine (ALLEGRA) 60 MG tablet Take 1 tablet (60 mg total) by mouth daily as needed. For allergies (Patient not taking: Reported on 11/28/2015) 30 tablet 3  . gabapentin (NEURONTIN) 300 MG capsule Take 1 capsule (300 mg total) by mouth at bedtime. 30 capsule 2  . HYDROcodone-acetaminophen (NORCO) 5-325 MG tablet Take 1 tablet by mouth every 8 (eight) hours as needed. Do not fill until 60 days from this date 60 tablet 0  . HYDROcodone-acetaminophen (NORCO) 5-325 MG tablet Take 1 tablet by mouth every 8 (eight) hours as needed for moderate pain. Do not fill for 30 days 60 tablet 0  . HYDROcodone-acetaminophen (NORCO/VICODIN) 5-325 MG tablet Take 1 tablet by mouth every 8 (eight) hours as needed. 60 tablet 0  . isosorbide mononitrate (IMDUR) 60 MG 24 hr tablet TAKE 1 TABLET BY MOUTH ONCE DAILY 90 tablet 1  . Loratadine (CLARITIN) 10 MG CAPS Take 1 tab po daily  30 each 11  . metoprolol tartrate (LOPRESSOR) 100 MG tablet TAKE 1 TABLET BY MOUTH TWICE DAILY 120 tablet 2  . mometasone (NASONEX) 50 MCG/ACT nasal spray Place 2 sprays into the nose daily. 17 g 2  . montelukast (SINGULAIR) 10 MG tablet take 1 tablet by mouth at bedtime 30 tablet 11  . nitroGLYCERIN (NITROSTAT) 0.4 MG SL tablet PLACE 1 TABLET UNDER THE TONGUE IF NEEDED FOR CHEST PAIN EVERY 5 MINUTES FOR 3 DOSES. IF NO RELIEF AFTER 3RD DOSE CALL MD OR 911 25 tablet 0  . omeprazole (PRILOSEC) 20 MG capsule TAKE 1 CAPSULE BY MOUTH ONCE DAILY 90 capsule 3  . pravastatin (PRAVACHOL) 40 MG tablet Take 1 tablet (40 mg total) by mouth daily. 90 tablet 1  . traZODone (DESYREL)  50 MG tablet Take 0.5-1 tablets (25-50 mg total) by mouth at bedtime. 90 tablet 2   No current facility-administered medications for this visit.     Social: Smoking history: Denies Alcohol use: He is completely stopped! This is great news Illicit drug use: Denies Relationship status:   Married Occupation: Retired  Family History: No family history of lung disease.  Review of Systems  Constitutional: Negative for fever.  HENT: Seasonal allergy symptoms mostly with nasal congestion and drainage. Eyes: Negative for blurred vision.  Respiratory: Negative for cough and wheezing.   Cardiovascular: Negative for chest pain, palpitations and leg swelling.  Gastrointestinal: Negative for nausea, vomiting and abdominal pain.  Genitourinary: Negative for dysuria, hematuria and flank pain.  Musculoskeletal: Negative for neck pain.  Skin: Negative for rash.  Neurological: Negative for dizziness and headaches.  Psychiatric/Behavioral: Negative for depression and suicidal ideas.   Exam: BP 118/82   Pulse (!) 52   Temp 98.3 F (36.8 C) (Oral)   Ht 6' (1.829 m)   Wt 215 lb 3.2 oz (97.6 kg)   SpO2 99%   BMI 29.19 kg/m  Gen:  Alert, cooperative patient who appears stated age in no acute distress.  Vital signs reviewed. Head: [place]/AT.   Eyes:  EOMI, PERRL.   Ears:  External ears WNL, Bilateral TM's normal without retraction, redness or bulging. Nose:  Septum midline with enlarged turbinates bilaterally. Mouth:  MMM, tonsils non-erythematous, non-edematous.   Neck: No masses or thyromegaly or limitation in range of motion.  No cervical lymphadenopathy. Pulm:  Clear to auscultation bilaterally with good air movement.  No wheezes or rales noted.   Cardiac:  Regular rate and rhythm without murmur auscultated.  Good S1/S2. Abd:  Soft/nondistended/nontender.  Good bowel sounds throughout all four quadrants.  No masses noted.  Ext:  No clubbing/cyanosis/erythema.  No edema noted bilateral lower  extremities.   Neuro:  Grossly normal, no gait abnormalities Psych:  Not depressed or anxious appearing.  Conversant and engaged  Impression/Plan: 1. Complete Physical Examination: anticipatory guidance provided.  S/p TDAP.   2.    Alcohol abuse: Completely resolved we will removed from his problem list.  Congratulated him on this. 3.  Vaccines: Up-to-date.  Always declines flu shot. 4.  Screening cholesterol: obtain FLP.

## 2018-05-27 ENCOUNTER — Encounter: Payer: Self-pay | Admitting: Family Medicine

## 2018-05-27 LAB — CBC
Hematocrit: 43 % (ref 37.5–51.0)
Hemoglobin: 14.6 g/dL (ref 13.0–17.7)
MCH: 33.2 pg — ABNORMAL HIGH (ref 26.6–33.0)
MCHC: 34 g/dL (ref 31.5–35.7)
MCV: 98 fL — AB (ref 79–97)
PLATELETS: 167 10*3/uL (ref 150–450)
RBC: 4.4 x10E6/uL (ref 4.14–5.80)
RDW: 14.6 % (ref 12.3–15.4)
WBC: 5.4 10*3/uL (ref 3.4–10.8)

## 2018-05-27 LAB — BASIC METABOLIC PANEL
BUN/Creatinine Ratio: 15 (ref 10–24)
BUN: 13 mg/dL (ref 8–27)
CALCIUM: 9.3 mg/dL (ref 8.6–10.2)
CHLORIDE: 102 mmol/L (ref 96–106)
CO2: 25 mmol/L (ref 20–29)
Creatinine, Ser: 0.89 mg/dL (ref 0.76–1.27)
GFR calc Af Amer: 106 mL/min/{1.73_m2} (ref >59)
GFR calc non Af Amer: 92 mL/min/{1.73_m2} (ref >59)
GLUCOSE: 86 mg/dL (ref 65–99)
Potassium: 4.7 mmol/L (ref 3.5–5.2)
Sodium: 143 mmol/L (ref 134–144)

## 2018-05-27 LAB — LIPID PANEL
CHOLESTEROL TOTAL: 155 mg/dL (ref 100–199)
Chol/HDL Ratio: 4.2 ratio (ref 0.0–5.0)
HDL: 37 mg/dL — AB (ref >39)
LDL Calculated: 55 mg/dL (ref 0–99)
TRIGLYCERIDES: 315 mg/dL — AB (ref 0–149)
VLDL Cholesterol Cal: 63 mg/dL — ABNORMAL HIGH (ref 5–40)

## 2018-05-28 ENCOUNTER — Encounter: Payer: Self-pay | Admitting: Family Medicine

## 2018-05-28 NOTE — Assessment & Plan Note (Signed)
Doing well.  No red flags.  He does not ever require early refills.  Helps with his ADLs.  I have given refills today.

## 2018-05-28 NOTE — Assessment & Plan Note (Signed)
Chest pain under control.  He has had no exertional chest pain.  He does have nitroglycerin if needed.

## 2018-05-28 NOTE — Assessment & Plan Note (Signed)
Stopping the Claritin.  He can continue the Allegra switch to cetirizine and see if this helps.  We are trying Nasonex to see if this helps with his symptoms and causes decreased/no nosebleeds

## 2018-06-06 ENCOUNTER — Other Ambulatory Visit: Payer: Self-pay | Admitting: Family Medicine

## 2018-06-19 ENCOUNTER — Other Ambulatory Visit: Payer: Self-pay | Admitting: Family Medicine

## 2018-06-23 ENCOUNTER — Telehealth: Payer: Self-pay

## 2018-06-23 NOTE — Telephone Encounter (Signed)
SENT REFERRAL TO SCHEDULING AND FILED NOTES 

## 2018-07-05 ENCOUNTER — Other Ambulatory Visit: Payer: Self-pay | Admitting: Family Medicine

## 2018-07-06 NOTE — Telephone Encounter (Signed)
Please confirm patient needs refills- per history, 90 tablets with 3 refills sent to pharmacy in July.

## 2018-07-08 ENCOUNTER — Other Ambulatory Visit: Payer: Self-pay | Admitting: Family Medicine

## 2018-07-28 ENCOUNTER — Other Ambulatory Visit: Payer: Self-pay | Admitting: Family Medicine

## 2018-07-31 ENCOUNTER — Other Ambulatory Visit: Payer: Self-pay | Admitting: Family Medicine

## 2018-08-25 ENCOUNTER — Other Ambulatory Visit: Payer: Self-pay

## 2018-08-25 NOTE — Telephone Encounter (Signed)
Pt called nurse line requesting a refill on Buspar 30mg . Pt stated the 30mg  tabs need to be sent in to pharmacy. Please advise.

## 2018-08-26 MED ORDER — BUSPIRONE HCL 30 MG PO TABS: 30.0000 mg | ORAL_TABLET | Freq: Two times a day (BID) | ORAL | 3 refills | Status: DC

## 2018-09-02 ENCOUNTER — Other Ambulatory Visit: Payer: Self-pay | Admitting: *Deleted

## 2018-09-02 MED ORDER — PRAVASTATIN SODIUM 40 MG PO TABS: ORAL_TABLET | ORAL | 3 refills | Status: DC

## 2018-09-15 ENCOUNTER — Other Ambulatory Visit: Payer: Self-pay | Admitting: Family Medicine

## 2018-10-13 ENCOUNTER — Ambulatory Visit (INDEPENDENT_AMBULATORY_CARE_PROVIDER_SITE_OTHER): Payer: Medicare Other | Admitting: Family Medicine

## 2018-10-13 VITALS — BP 124/70 | HR 56 | Temp 98.4°F | Wt 214.4 lb

## 2018-10-13 DIAGNOSIS — J0100 Acute maxillary sinusitis, unspecified: Secondary | ICD-10-CM | POA: Diagnosis not present

## 2018-10-13 MED ORDER — OXYMETAZOLINE HCL 0.05 % NA SOLN: 1.0000 | Freq: Two times a day (BID) | NASAL | 0 refills | Status: DC

## 2018-10-13 MED ORDER — AMOXICILLIN 500 MG PO CAPS: 1000.0000 mg | ORAL_CAPSULE | Freq: Three times a day (TID) | ORAL | 0 refills | Status: AC

## 2018-10-13 NOTE — Patient Instructions (Signed)
It was great seeing you today! We have addressed the following issues today  1. I am prescribing an antibiotic that you will take 3 times a day for 5 days. You might experience loose stools.  2. If symptoms do not improve please return to clinic for further evaluation. 3.  I am prescribing Afrin use only for 2 days it will help with the congestion.  If we did any lab work today, and the results require attention, either me or my nurse will get in touch with you. If everything is normal, you will get a letter in mail and a message via . If you don't hear from [place] in two weeks, please give [place] a call. Otherwise, we look forward to seeing you again at your next visit. If you have any questions or concerns before then, please call the clinic at [phone].  Please bring all your medications to every doctors visit  Sign up for My Chart to have easy access to your labs results, and communication with your Primary care physician. Please ask Front Desk for some assistance.   Please check-out at the front desk before leaving the clinic.    Take Care,   Dr. Andy Gauss

## 2018-10-13 NOTE — Assessment & Plan Note (Signed)
Patient present with right maxillary pain and pressure as well as right ear pain. On exam, TM is bulging with purulent fluid behind it. Pain elicited with pressure on maxillary sinus. No fever other red flags reported except for headaches. Given symptoms on presentation and exam findings, will treat for otitis media in the setting of sinusitis. will prescribe antibiotic for the next 5 days. Will also prescribe Afrin for nasal congestion to be use for only 2 days.  --Prescribe amoxicillin 1000 mg tid for 5 days  --Follow up on prn basis

## 2018-10-13 NOTE — Progress Notes (Signed)
   Subjective:    Patient ID: Corey Watts., male    DOB: [birth year], 63 y.o.   MRN: [id number]   CC: Right ear and facial pain  HPI: Patient is a 63 yo male who presents today for right ear and facial pain. Patient reports symptoms started on two days ago with nasal congestion. He then develop ear pain and headaches. Patient has been unable to sleep due to pain and pressure. He also reports watery eyes. Pain is is only on the right side of his face. He denies any fevers, chills, nausea, vomiting, myalgias and abdominal pain.  Smoking status reviewed   ROS: all other systems were reviewed and are negative other than in the HPI   Past Medical History:  Diagnosis Date  . Allergy    Allergic rhinitis  . Angina pectoris ([place]) 2006   Known coronary vasospasm -- treated with combination of long-acting nitrates and calcium channel blockers; NTG when necessary  . CAD (coronary artery disease) 04/2005   Mild obstructive ramus intermedius disease (30-50%) and 50-60% proximal  . Chronic back pain    Stemming from prior lumbar diskectomy  . GERD (gastroesophageal reflux disease)   . Hyperlipidemia   . Hypertension   . Obesity   . Substance abuse ([place])    Known chronic alcoholic - not interested in quitting (current as of 2014)    Past Surgical History:  Procedure Laterality Date  . BACK SURGERY  1989, 1997   Chronic back pain stemming from this  . CARDIOVASCULAR STRESS TEST  2005   Dr. Daneen Schick -- Cardiolite stress test showed no evidence of ischemia  . COLONOSCOPY W/ BIOPSIES  2011   Multiple small polyps and s/p polypectomy -- recommended repeat in 2 years due to multiple small polyps  . ESOPHAGEAL DILATION  1997    Past medical history, surgical, family, and social history reviewed and updated in the EMR as appropriate.  Objective:  BP 124/70   Pulse (!) 56   Temp 98.4 F (36.9 C) (Oral)   Wt 214 lb 6 oz (97.2 kg)   SpO2 96%   BMI 29.07 kg/m   Vitals and nursing  note reviewed  General: NAD, pleasant, able to participate in exam HEENT: Right TM bulging with purulent fluid behind it, sinus pain with pressure applied to maxillary sinus, boggy nasal mucosa Cardiac: RRR, normal heart sounds, no murmurs. 2+ radial and PT pulses bilaterally Respiratory: CTAB, normal effort, No wheezes, rales or rhonchi Abdomen: soft, nontender, nondistended, no hepatic or splenomegaly, +BS Extremities: no edema or cyanosis. WWP. Skin: warm and dry, no rashes noted Neuro: alert and oriented x4, no focal deficits Psych: Normal affect and mood   Assessment & Plan:   Acute non-recurrent maxillary sinusitis Patient present with right maxillary pain and pressure as well as right ear pain. On exam, TM is bulging with purulent fluid behind it. Pain elicited with pressure on maxillary sinus. No fever other red flags reported except for headaches. Given symptoms on presentation and exam findings, will treat for otitis media in the setting of sinusitis. will prescribe antibiotic for the next 5 days. Will also prescribe Afrin for nasal congestion to be use for only 2 days.  --Prescribe amoxicillin 1000 mg tid for 5 days  --Follow up on prn basis    Marjie Skiff, MD [facility] PGY-3

## 2018-10-14 ENCOUNTER — Other Ambulatory Visit: Payer: Self-pay | Admitting: Family Medicine

## 2018-10-23 ENCOUNTER — Other Ambulatory Visit: Payer: Self-pay | Admitting: Family Medicine

## 2018-10-27 ENCOUNTER — Other Ambulatory Visit: Payer: Self-pay | Admitting: Family Medicine

## 2018-11-25 ENCOUNTER — Ambulatory Visit (INDEPENDENT_AMBULATORY_CARE_PROVIDER_SITE_OTHER): Payer: Medicare Other | Admitting: Family Medicine

## 2018-11-25 ENCOUNTER — Other Ambulatory Visit: Payer: Self-pay

## 2018-11-25 ENCOUNTER — Encounter: Payer: Self-pay | Admitting: Family Medicine

## 2018-11-25 VITALS — BP 110/66 | HR 77 | Temp 98.6°F | Ht 72.0 in | Wt 218.2 lb

## 2018-11-25 DIAGNOSIS — L719 Rosacea, unspecified: Secondary | ICD-10-CM

## 2018-11-25 DIAGNOSIS — M545 Low back pain, unspecified: Secondary | ICD-10-CM

## 2018-11-25 DIAGNOSIS — Z0000 Encounter for general adult medical examination without abnormal findings: Secondary | ICD-10-CM | POA: Diagnosis not present

## 2018-11-25 DIAGNOSIS — G8929 Other chronic pain: Secondary | ICD-10-CM

## 2018-11-25 MED ORDER — HYDROCODONE-ACETAMINOPHEN 5-325 MG PO TABS: 1.0000 | ORAL_TABLET | Freq: Three times a day (TID) | ORAL | 0 refills | Status: DC | PRN

## 2018-11-25 MED ORDER — METRONIDAZOLE 0.75 % EX GEL: 1.0000 "application " | Freq: Two times a day (BID) | CUTANEOUS | 0 refills | Status: DC

## 2018-11-25 NOTE — Patient Instructions (Addendum)
Use the Metrogel twice daily on your face.   You'll need to use this for the next 8 weeks or so.  Let me know how it's helping after about 6 weeks.  It was great to see you today, and great to have taken care of you!

## 2018-11-25 NOTE — Progress Notes (Signed)
Corey Ranch Sr. is a 63 y.o. male who presents to [facility] today for a comprehensive physical examination:  CPE  Concerns:  None today.  Doing very well.  Still no drinking.  Did have 1 beer since last time I saw him and didn't enjoy it, so no further alcohol use.    Rosacea:  Has some redness and dry skin across bridge of his nose and cheeks for past several years.  Using moisturizers without much relief.   Last physical last year Tetanus up to date Flu vaccine declines  Eye exam:  n/a Dental exam every six months.   PMH reviewed. Patient is a nonsmoker.   Past Medical History:  Diagnosis Date  . Allergy    Allergic rhinitis  . Angina pectoris ([place]) 2006   Known coronary vasospasm -- treated with combination of long-acting nitrates and calcium channel blockers; NTG when necessary  . CAD (coronary artery disease) 04/2005   Mild obstructive ramus intermedius disease (30-50%) and 50-60% proximal  . Chronic back pain    Stemming from prior lumbar diskectomy  . GERD (gastroesophageal reflux disease)   . Hyperlipidemia   . Hypertension   . Obesity   . Substance abuse ([place])    Known chronic alcoholic - not interested in quitting (current as of 2014)   Past Surgical History:  Procedure Laterality Date  . BACK SURGERY  1989, 1997   Chronic back pain stemming from this  . CARDIOVASCULAR STRESS TEST  2005   Dr. Daneen Schick -- Cardiolite stress test showed no evidence of ischemia  . COLONOSCOPY W/ BIOPSIES  2011   Multiple small polyps and s/p polypectomy -- recommended repeat in 2 years due to multiple small polyps  . ESOPHAGEAL DILATION  1997    Medications reviewed. Current Outpatient Medications  Medication Sig Dispense Refill  . Acetaminophen (TYLENOL) 325 MG CAPS Take 1 capsule by mouth daily. Pt takes one a day for mild pain    . amitriptyline (ELAVIL) 50 MG tablet TAKE 1 TABLET BY MOUTH AT BEDTIME IF NEEDED FOR SLEEP 90 tablet 0  . amLODipine (NORVASC) 5  MG tablet TAKE 1 TABLET BY MOUTH ONCE DAILY 90 tablet 2  . aspirin 81 MG EC tablet Take 81 mg by mouth daily.      . busPIRone (BUSPAR) 15 MG tablet TAKE 2 TABLETS BY MOUTH TWICE DAILY 60 tablet 3  . busPIRone (BUSPAR) 30 MG tablet Take 1 tablet (30 mg total) by mouth 2 (two) times daily. 180 tablet 3  . cetirizine (ZYRTEC) 10 MG tablet TAKE 1 TABLET BY MOUTH ONCE DAILY 90 tablet 2  . cyclobenzaprine (FLEXERIL) 10 MG tablet take 1 tablet by mouth twice a day if needed muscle spasm 30 tablet 1  . fexofenadine (ALLEGRA) 60 MG tablet Take 1 tablet (60 mg total) by mouth daily as needed. For allergies (Patient not taking: Reported on 11/28/2015) 30 tablet 3  . gabapentin (NEURONTIN) 300 MG capsule Take 1 capsule (300 mg total) by mouth at bedtime. 30 capsule 2  . HYDROcodone-acetaminophen (NORCO) 5-325 MG tablet Take 1 tablet by mouth every 8 (eight) hours as needed. Do not fill until 60 days from this date 60 tablet 0  . HYDROcodone-acetaminophen (NORCO) 5-325 MG tablet Take 1 tablet by mouth every 8 (eight) hours as needed for moderate pain. Do not fill for 30 days 60 tablet 0  . HYDROcodone-acetaminophen (NORCO/VICODIN) 5-325 MG tablet Take 1 tablet by mouth every 8 (eight) hours as needed.  60 tablet 0  . isosorbide mononitrate (IMDUR) 60 MG 24 hr tablet TAKE 1 TABLET BY MOUTH ONCE DAILY 90 tablet 1  . loratadine (CLARITIN) 10 MG tablet TAKE 1 TABLET BY MOUTH ONCE DAILY 90 tablet 3  . metoprolol tartrate (LOPRESSOR) 100 MG tablet TAKE 1 TABLET BY MOUTH TWICE DAILY 120 tablet 2  . mometasone (NASONEX) 50 MCG/ACT nasal spray Place 2 sprays into the nose daily. 17 g 2  . montelukast (SINGULAIR) 10 MG tablet TAKE 1 TABLET BY MOUTH AT BEDTIME 90 tablet 0  . nitroGLYCERIN (NITROSTAT) 0.4 MG SL tablet PLACE 1 TABLET UNDER THE TONGUE IF NEEDED FOR CHEST PAIN EVERY 5 MINUTES FOR 3 DOSES. IF NO RELIEF AFTER 3RD DOSE CALL MD OR 911 25 tablet 0  . omeprazole (PRILOSEC) 20 MG capsule TAKE 1 CAPSULE BY MOUTH ONCE  DAILY 90 capsule 3  . oxymetazoline (AFRIN NASAL SPRAY) 0.05 % nasal spray Place 1 spray into both nostrils 2 (two) times daily. 30 mL 0  . pravastatin (PRAVACHOL) 40 MG tablet TAKE 1 TABLET(40 MG) BY MOUTH DAILY 90 tablet 3  . traZODone (DESYREL) 50 MG tablet TAKE 1/2 TO 1 TABLET BY MOUTH AT BEDTIME 90 tablet 0   No current facility-administered medications for this visit.     Social: Smoking history:  THC use Alcohol use:  Prior, none now Relationship status:   married Occupation:  Works [employer] representative  Family History:  No family history of lung disease.   Review of Systems  Constitutional: Negative for fever.  HENT: Negative for congestion, ear discharge, ear pain and hearing loss.   Eyes: Negative for blurred vision.  Respiratory: Negative for cough and wheezing.   Cardiovascular: Negative for chest pain, palpitations and leg swelling.  Gastrointestinal: Negative for nausea, vomiting and abdominal pain.  Genitourinary: Negative for dysuria, hematuria and flank pain.  Musculoskeletal: Negative for neck pain.  Skin: Negative for rash.  Neurological: Negative for dizziness and headaches.  Psychiatric/Behavioral: Negative for depression and suicidal ideas.   Exam: BP 110/66   Pulse 77   Temp 98.6 F (37 C) (Oral)   Ht 6' (1.829 m)   Wt 218 lb 3.2 oz (99 kg)   SpO2 95%   BMI 29.59 kg/m  Gen:  Alert, cooperative patient who appears stated age in no acute distress.  Vital signs reviewed. Head: [place]/AT.   Eyes:  EOMI, PERRL.   Ears:  External ears WNL, Bilateral TM's normal without retraction, redness or bulging. Nose:  Septum midline  Mouth:  MMM, tonsils non-erythematous, non-edematous.   Neck: No masses or thyromegaly or limitation in range of motion.  No cervical lymphadenopathy. Pulm:  Clear to auscultation bilaterally with good air movement.  No wheezes or rales noted.   Cardiac:  Regular rate and rhythm without murmur auscultated.  Good S1/S2. Abd:   Soft/nondistended/nontender.  Good bowel sounds throughout all four quadrants.  No masses noted.  Ext:  No clubbing/cyanosis/erythema.  No edema noted bilateral lower extremities.   Neuro:  Grossly normal, no gait abnormalities Psych:  Not depressed or anxious appearing.  Conversant and engaged  Impression/Plan: 1. Complete Physical Examination: anticipatory guidance provided.  2.  Screening cholesterol: obtain FLP next visit  3.  Chronic back pain:  Refilled chronic Norco meds today.   4.  Rosacea:  Treat with metrogel.  FU in 1 month to assess for improvement.

## 2018-11-27 ENCOUNTER — Encounter: Payer: Self-pay | Admitting: Family Medicine

## 2018-12-15 ENCOUNTER — Other Ambulatory Visit: Payer: Self-pay | Admitting: Family Medicine

## 2019-01-19 ENCOUNTER — Other Ambulatory Visit: Payer: Self-pay | Admitting: Family Medicine

## 2019-01-24 ENCOUNTER — Other Ambulatory Visit: Payer: Self-pay | Admitting: Family Medicine

## 2019-02-19 DIAGNOSIS — M7731 Calcaneal spur, right foot: Secondary | ICD-10-CM | POA: Diagnosis not present

## 2019-02-19 DIAGNOSIS — M722 Plantar fascial fibromatosis: Secondary | ICD-10-CM | POA: Diagnosis not present

## 2019-02-19 DIAGNOSIS — M7751 Other enthesopathy of right foot: Secondary | ICD-10-CM | POA: Diagnosis not present

## 2019-03-15 ENCOUNTER — Other Ambulatory Visit: Payer: Self-pay

## 2019-03-16 DIAGNOSIS — M722 Plantar fascial fibromatosis: Secondary | ICD-10-CM | POA: Diagnosis not present

## 2019-03-16 DIAGNOSIS — M71571 Other bursitis, not elsewhere classified, right ankle and foot: Secondary | ICD-10-CM | POA: Diagnosis not present

## 2019-03-16 MED ORDER — AMITRIPTYLINE HCL 50 MG PO TABS: 50.0000 mg | ORAL_TABLET | Freq: Every evening | ORAL | 0 refills | Status: DC | PRN

## 2019-03-16 NOTE — Telephone Encounter (Signed)
Red team, please ask patient to schedule a visit to meet me as I will be his new PCP since Dr. Mingo Amber left. Will refill amitriptyline but would like to see him in office.  Thanks Leeanne Rio, MD

## 2019-03-16 NOTE — Telephone Encounter (Signed)
Attempted to call pt, no answer. Will try again later. Corey Watts, CMA  

## 2019-03-18 NOTE — Telephone Encounter (Signed)
Left message for patient to call office and schedule an appointment with Dr. Ardelia Mems.  Ozella Almond, [place]

## 2019-03-22 ENCOUNTER — Other Ambulatory Visit: Payer: Self-pay

## 2019-03-23 MED ORDER — OMEPRAZOLE 20 MG PO CPDR: 20.0000 mg | DELAYED_RELEASE_CAPSULE | Freq: Every day | ORAL | 3 refills | Status: DC

## 2019-03-29 ENCOUNTER — Other Ambulatory Visit: Payer: Self-pay | Admitting: *Deleted

## 2019-03-29 MED ORDER — AMLODIPINE BESYLATE 5 MG PO TABS: 5.0000 mg | ORAL_TABLET | Freq: Every day | ORAL | 2 refills | Status: DC

## 2019-04-06 ENCOUNTER — Encounter: Payer: Self-pay | Admitting: Family Medicine

## 2019-04-06 ENCOUNTER — Other Ambulatory Visit: Payer: Self-pay

## 2019-04-06 ENCOUNTER — Ambulatory Visit (INDEPENDENT_AMBULATORY_CARE_PROVIDER_SITE_OTHER): Payer: Medicare Other | Admitting: Family Medicine

## 2019-04-06 VITALS — BP 112/68 | HR 59

## 2019-04-06 DIAGNOSIS — L570 Actinic keratosis: Secondary | ICD-10-CM

## 2019-04-06 DIAGNOSIS — F411 Generalized anxiety disorder: Secondary | ICD-10-CM | POA: Diagnosis not present

## 2019-04-06 DIAGNOSIS — E782 Mixed hyperlipidemia: Secondary | ICD-10-CM | POA: Diagnosis not present

## 2019-04-06 DIAGNOSIS — L989 Disorder of the skin and subcutaneous tissue, unspecified: Secondary | ICD-10-CM | POA: Insufficient documentation

## 2019-04-06 DIAGNOSIS — G8929 Other chronic pain: Secondary | ICD-10-CM

## 2019-04-06 NOTE — Assessment & Plan Note (Signed)
I discussed with the patient that he may benefit from an SSRI such as Zoloft, Prozac, or Celexa if he would like to better control his anxiety.  He says that his anxiety is not causing him significant difficulty currently, so we will continue to watch this and treat with buspirone and amitriptyline.

## 2019-04-06 NOTE — Assessment & Plan Note (Signed)
Told patient that we do not know definitively what this is unless we biopsy it, but it appears to be an actinic keratosis.  This is a precancerous lesion that can sometimes become squamous cell carcinoma if left untreated.  I told him that we can freeze this in the future if he is interested.  Patient would like to watch it longer before we freeze the lesion.  I counseled him to let me know if it begins to bleed or undergo any other changes.

## 2019-04-06 NOTE — Progress Notes (Signed)
Subjective:    Corey Watts. - 63 y.o. male MRN [id number]  Date of birth: Feb 02, 1956  CC:  Corey NEYHART Sr. is here for follow up of hyperlipidemia and anxiety.  He would also like to discuss a lesion on his left cheek.  HPI: Skin lesion Has noticed a pink, rough lesion on his left cheek.  It does not bleed and does not itch.  He has had a lot of sun exposure in the past.  Hyperlipidemia Triglycerides were elevated to 315 at his last lipid panel in September 2019.  He has not eaten yet today.  He consumes about 3-4 beers per week, and he is happy with how much she has reduced his drinking.  Anxiety Says that his anxiety is chronic and managed with buspirone twice daily and amitriptyline nightly.  He says that it is much better now that he no longer drinks a sixpack of beer per day.  Chronic pain Dr. Mingo Amber would write 3 prescriptions for Norco 60 tablets for his chronic back pain at 1 time, which would often last him 3 to 6 months.  He says that he does not take this every day, and 60 pills will usually last 2 to 3 months.  He will not need more medication for another 6 months.  Health Maintenance:  There are no preventive care reminders to display for this patient.  -  reports that he has never smoked. He has never used smokeless tobacco. - Review of Systems: Per HPI. - Past Medical History: Patient Active Problem List   Diagnosis Date Noted  . Actinic keratosis of left cheek 04/06/2019  . Acute non-recurrent maxillary sinusitis 10/13/2018  . Raynaud phenomenon 05/23/2017  . Encounter for chronic pain management 10/11/2014  . Insomnia 06/30/2013  . Gout 12/24/2012  . Allergic rhinitis 11/27/2009  . OVERWEIGHT 09/18/2009  . Essential hypertension, benign 01/04/2008  . Backache 01/04/2008  . Hypertriglyceridemia 07/15/2007  . Anxiety state 11/13/2006  . Coronary atherosclerosis 11/13/2006  . GASTROESOPHAGEAL REFLUX, NO ESOPHAGITIS 11/13/2006   - Medications:  reviewed and updated   Objective:   Physical Exam BP 112/68   Pulse (!) 59   SpO2 96%  Gen: NAD, alert, cooperative with exam, well-appearing, pleasant, obese HEENT: NCAT, clear conjunctiva, supple neck CV: RRR, good S1/S2, no murmur, no edema Resp: CTABL, no wheezes, non-labored Abd: SNTND, BS present, no guarding or organomegaly Skin: Pink, papular, rough lesion on patient's left zygomatic arch, does not appear to be infected or inflamed Neuro: no gross deficits.  Psych: good insight, alert and oriented        Assessment & Plan:   Actinic keratosis of left cheek Told patient that we do not know definitively what this is unless we biopsy it, but it appears to be an actinic keratosis.  This is a precancerous lesion that can sometimes become squamous cell carcinoma if left untreated.  I told him that we can freeze this in the future if he is interested.  Patient would like to watch it longer before we freeze the lesion.  I counseled him to let me know if it begins to bleed or undergo any other changes.  Anxiety state I discussed with the patient that he may benefit from an SSRI such as Zoloft, Prozac, or Celexa if he would like to better control his anxiety.  He says that his anxiety is not causing him significant difficulty currently, so we will continue to watch this and treat with buspirone and  amitriptyline.  Encounter for chronic pain management Since patient does not use this medication daily and has cut down on his alcohol use, I will continue prescribing his Norco as Dr. Mingo Amber has since this has worked very well for the patient.  We will continue with 3 prescriptions at a time of Norco 5/325 mg 60 tablets, which should last him about 6 to 9 months.  [place] controlled substances database was reviewed and did not show red flags.  Hypertriglyceridemia We will check a fasting lipid profile today.  Patient's past hypertriglyceridemia could be due to alcohol use, so hopefully it is  improved today.    Maia Breslow, M.D. 04/06/2019, 9:22 AM PGY-3, [facility]

## 2019-04-06 NOTE — Assessment & Plan Note (Addendum)
Since patient does not use this medication daily and has cut down on his alcohol use, I will continue prescribing his Norco as Dr. Mingo Amber has since this has worked very well for the patient.  We will continue with 3 prescriptions at a time of Norco 5/325 mg 60 tablets, which should last him about 6 to 9 months.  [place] controlled substances database was reviewed and did not show red flags.

## 2019-04-06 NOTE — Patient Instructions (Addendum)
It was nice meeting you today Mr. Belcher!  Today, we are checking your cholesterol.  We will let you know what these results are when they return.  If your anxiety becomes more of an issue for you, we can discuss starting a medication that you would take daily for this, such as Prozac, Zoloft, or Celexa.  I will see you back in about 6 months.  If you have any questions or concerns, please feel free to call the clinic.   Be well,  Dr. Shan Levans

## 2019-04-06 NOTE — Assessment & Plan Note (Signed)
We will check a fasting lipid profile today.  Patient's past hypertriglyceridemia could be due to alcohol use, so hopefully it is improved today.

## 2019-04-07 LAB — LIPID PANEL
Chol/HDL Ratio: 4.3 ratio (ref 0.0–5.0)
Cholesterol, Total: 173 mg/dL (ref 100–199)
HDL: 40 mg/dL (ref >39)
LDL Calculated: 63 mg/dL (ref 0–99)
Triglycerides: 349 mg/dL — ABNORMAL HIGH (ref 0–149)
VLDL Cholesterol Cal: 70 mg/dL — ABNORMAL HIGH (ref 5–40)

## 2019-04-09 ENCOUNTER — Other Ambulatory Visit: Payer: Self-pay

## 2019-04-09 MED ORDER — METOPROLOL TARTRATE 100 MG PO TABS: 100.0000 mg | ORAL_TABLET | Freq: Two times a day (BID) | ORAL | 2 refills | Status: DC

## 2019-04-13 ENCOUNTER — Telehealth: Payer: Self-pay | Admitting: Family Medicine

## 2019-04-13 NOTE — Telephone Encounter (Signed)
Left a voicemail on Corey Watts's home number.  Told him that his triglycerides remain elevated, but since he does not have diabetes and the rest of his cholesterol panel looks good, we can continue working on lifestyle modifications for him, which would mean lowering his sugar intake and eating more fruits and vegetables.  We will continue to monitor this yearly, and we can always add on a triglyceride lowering medication if the patient is interested.

## 2019-04-19 ENCOUNTER — Other Ambulatory Visit: Payer: Self-pay

## 2019-04-19 MED ORDER — TRAZODONE HCL 50 MG PO TABS: 25.0000 mg | ORAL_TABLET | Freq: Every day | ORAL | 0 refills | Status: DC

## 2019-05-17 ENCOUNTER — Ambulatory Visit (INDEPENDENT_AMBULATORY_CARE_PROVIDER_SITE_OTHER): Payer: Medicare Other | Admitting: Family Medicine

## 2019-05-17 ENCOUNTER — Encounter: Payer: Self-pay | Admitting: Family Medicine

## 2019-05-17 ENCOUNTER — Other Ambulatory Visit: Payer: Self-pay

## 2019-05-17 VITALS — BP 120/82 | HR 55 | Ht 72.0 in | Wt 215.2 lb

## 2019-05-17 DIAGNOSIS — L989 Disorder of the skin and subcutaneous tissue, unspecified: Secondary | ICD-10-CM

## 2019-05-17 DIAGNOSIS — M722 Plantar fascial fibromatosis: Secondary | ICD-10-CM

## 2019-05-17 MED ORDER — BUSPIRONE HCL 30 MG PO TABS: 30.0000 mg | ORAL_TABLET | Freq: Two times a day (BID) | ORAL | 3 refills | Status: DC

## 2019-05-17 NOTE — Assessment & Plan Note (Signed)
Appears to be concerning for basal cell carcinoma.  Patient would benefit from shave biopsy.  Counseled patient that he does not need to continue using Neosporin and that although this is not an emergency, he should have this removed in the near future to prevent it from growing much larger.  He understands that it will need to be looked at through a microscope to get the definitive diagnosis and to ensure that his margins are clear.  Will schedule him in the dermatology clinic for removal.

## 2019-05-17 NOTE — Assessment & Plan Note (Signed)
Patient was counseled that this will improve with time, although it sometimes takes over a year for this improvement to occur.  Encouraged patient to freeze tennis balls and rolled them under the soles of his feet to provide relief from inflammation.  Gave patient a handout on plantar fasciitis and more stretches that he can incorporate into his daily schedule.

## 2019-05-17 NOTE — Patient Instructions (Addendum)
It was nice seeing you today Corey Watts!  You likely have a slow-growing skin cancer on your cheek.  We will get you into our clinic to remove this and get the diagnosis.  We will contact you with this appointment.  I am refilling your BuSpar today.  I am giving you information about plantar fasciitis.  Make sure to get some tennis balls and freeze them so that you can roll them under your feet.  If you have any questions or concerns, please feel free to call the clinic.   Be well,  Dr. Shan Levans  Plantar Fasciitis  Plantar fasciitis is a painful foot condition that affects the heel. It occurs when the band of tissue that connects the toes to the heel bone (plantar fascia) becomes irritated. This can happen as the result of exercising too much or doing other repetitive activities (overuse injury). The pain from plantar fasciitis can range from mild irritation to severe pain that makes it difficult to walk or move. The pain is usually worse in the morning after sleeping, or after sitting or lying down for a while. Pain may also be worse after long periods of walking or standing. What are the causes? This condition may be caused by:  Standing for long periods of time.  Wearing shoes that do not have good arch support.  Doing activities that put stress on joints (high-impact activities), including running, aerobics, and ballet.  Being overweight.  An abnormal way of walking (gait).  Tight muscles in the back of your lower leg (calf).  High arches in your feet.  Starting a new athletic activity. What are the signs or symptoms? The main symptom of this condition is heel pain. Pain may:  Be worse with first steps after a time of rest, especially in the morning after sleeping or after you have been sitting or lying down for a while.  Be worse after long periods of standing still.  Decrease after 30-45 minutes of activity, such as gentle walking. How is this diagnosed? This condition  may be diagnosed based on your medical history and your symptoms. Your health care provider may ask questions about your activity level. Your health care provider will do a physical exam to check for:  A tender area on the bottom of your foot.  A high arch in your foot.  Pain when you move your foot.  Difficulty moving your foot. You may have imaging tests to confirm the diagnosis, such as:  X-rays.  Ultrasound.  MRI. How is this treated? Treatment for plantar fasciitis depends on how severe your condition is. Treatment may include:  Rest, ice, applying pressure (compression), and raising the affected foot (elevation). This may be called RICE therapy. Your health care provider may recommend RICE therapy along with over-the-counter pain medicines to manage your pain.  Exercises to stretch your calves and your plantar fascia.  A splint that holds your foot in a stretched, upward position while you sleep (night splint).  Physical therapy to relieve symptoms and prevent problems in the future.  Injections of steroid medicine (cortisone) to relieve pain and inflammation.  Stimulating your plantar fascia with electrical impulses (extracorporeal shock wave therapy). This is usually the last treatment option before surgery.  Surgery, if other treatments have not worked after 12 months. Follow these instructions at home:  Managing pain, stiffness, and swelling  If directed, put ice on the painful area: ? Put ice in a plastic bag, or use a frozen bottle of water. ?  Place a towel between your skin and the bag or bottle. ? Roll the bottom of your foot over the bag or bottle. ? Do this for 20 minutes, 2-3 times a day.  Wear athletic shoes that have air-sole or gel-sole cushions, or try wearing soft shoe inserts that are designed for plantar fasciitis.  Raise (elevate) your foot above the level of your heart while you are sitting or lying down. Activity  Avoid activities that cause  pain. Ask your health care provider what activities are safe for you.  Do physical therapy exercises and stretches as told by your health care provider.  Try activities and forms of exercise that are easier on your joints (low-impact). Examples include swimming, water aerobics, and biking. General instructions  Take over-the-counter and prescription medicines only as told by your health care provider.  Wear a night splint while sleeping, if told by your health care provider. Loosen the splint if your toes tingle, become numb, or turn cold and blue.  Maintain a healthy weight, or work with your health care provider to lose weight as needed.  Keep all follow-up visits as told by your health care provider. This is important. Contact a health care provider if you:  Have symptoms that do not go away after caring for yourself at home.  Have pain that gets worse.  Have pain that affects your ability to move or do your daily activities. Summary  Plantar fasciitis is a painful foot condition that affects the heel. It occurs when the band of tissue that connects the toes to the heel bone (plantar fascia) becomes irritated.  The main symptom of this condition is heel pain that may be worse after exercising too much or standing still for a long time.  Treatment varies, but it usually starts with rest, ice, compression, and elevation (RICE therapy) and over-the-counter medicines to manage pain. This information is not intended to replace advice given to you by your health care provider. Make sure you discuss any questions you have with your health care provider. Document Released: 05/28/2001 Document Revised: 08/15/2017 Document Reviewed: 06/30/2017 Elsevier Patient Education  2020 Corey Watts.  Plantar Fasciitis Rehab Ask your health care provider which exercises are safe for you. Do exercises exactly as told by your health care provider and adjust them as directed. It is normal to feel mild  stretching, pulling, tightness, or discomfort as you do these exercises. Stop right away if you feel sudden pain or your pain gets worse. Do not begin these exercises until told by your health care provider. Stretching and range-of-motion exercises These exercises warm up your muscles and joints and improve the movement and flexibility of your foot. These exercises also help to relieve pain. Plantar fascia stretch  1. Sit with your left / right leg crossed over your opposite knee. 2. Hold your heel with one hand with that thumb near your arch. With your other hand, hold your toes and gently pull them back toward the top of your foot. You should feel a stretch on the bottom of your toes or your foot (plantar fascia) or both. 3. Hold this stretch for__________ seconds. 4. Slowly release your toes and return to the starting position. Repeat __________ times. Complete this exercise __________ times a day. Gastrocnemius stretch, standing This exercise is also called a calf (gastroc) stretch. It stretches the muscles in the back of the upper calf. 1. Stand with your hands against a wall. 2. Extend your left / right leg behind you,  and bend your front knee slightly. 3. Keeping your heels on the floor and your back knee straight, shift your weight toward the wall. Do not arch your back. You should feel a gentle stretch in your upper left / right calf. 4. Hold this position for __________ seconds. Repeat __________ times. Complete this exercise __________ times a day. Soleus stretch, standing This exercise is also called a calf (soleus) stretch. It stretches the muscles in the back of the lower calf. 1. Stand with your hands against a wall. 2. Extend your left / right leg behind you, and bend your front knee slightly. 3. Keeping your heels on the floor, bend your back knee and shift your weight slightly over your back leg. You should feel a gentle stretch deep in your lower calf. 4. Hold this position  for __________ seconds. Repeat __________ times. Complete this exercise __________ times a day. Gastroc and soleus stretch, standing step This exercise stretches the muscles in the back of the lower leg. These muscles are in the upper calf (gastrocnemius) and the lower calf (soleus). 1. Stand with the ball of your left / right foot on a step. The ball of your foot is on the walking surface, right under your toes. 2. Keep your other foot firmly on the same step. 3. Hold on to the wall or a railing for balance. 4. Slowly lift your other foot, allowing your body weight to press your left / right heel down over the edge of the step. You should feel a stretch in your left / right calf. 5. Hold this position for __________ seconds. 6. Return both feet to the step. 7. Repeat this exercise with a slight bend in your left / right knee. Repeat __________ times with your left / right knee straight and __________ times with your left / right knee bent. Complete this exercise __________ times a day. Balance exercise This exercise builds your balance and strength control of your arch to help take pressure off your plantar fascia. Single leg stand If this exercise is too easy, you can try it with your eyes closed or while standing on a pillow. 1. Without shoes, stand near a railing or in a doorway. You may hold on to the railing or door frame as needed. 2. Stand on your left / right foot. Keep your big toe down on the floor and try to keep your arch lifted. Do not let your foot roll inward. 3. Hold this position for __________ seconds. Repeat __________ times. Complete this exercise __________ times a day. This information is not intended to replace advice given to you by your health care provider. Make sure you discuss any questions you have with your health care provider. Document Released: 09/02/2005 Document Revised: 12/24/2018 Document Reviewed: 07/01/2018 Elsevier Patient Education  2020 [employer].

## 2019-05-17 NOTE — Progress Notes (Signed)
   Subjective:    Corey Watts. - 63 y.o. male MRN [id number]  Date of birth: [birth year]  CC:  Corey FRANTOM Sr. is here for a skin lesion on his cheek.  He would also like to discuss his plantar fasciitis.  HPI: Skin lesion Corey Watts reports that he has had this lesion for several months, and it continues to grow larger.  It has bled a couple times as well.  He has been applying Neosporin to the area.  Plantar fasciitis He also says that he continues to have pain from his plantar fasciitis on his bilateral feet.  He has seen his podiatrist for this and has been stretching as recommended daily.  His podiatrist told him to continue to take Tylenol for his pain.  Health Maintenance:  There are no preventive care reminders to display for this patient.  -  reports that he has never smoked. He has never used smokeless tobacco. - Review of Systems: Per HPI. - Past Medical History: Patient Active Problem List   Diagnosis Date Noted  . Plantar fasciitis, bilateral 05/17/2019  . Skin lesion of cheek 04/06/2019  . Acute non-recurrent maxillary sinusitis 10/13/2018  . Raynaud phenomenon 05/23/2017  . Encounter for chronic pain management 10/11/2014  . Insomnia 06/30/2013  . Gout 12/24/2012  . Allergic rhinitis 11/27/2009  . OVERWEIGHT 09/18/2009  . Essential hypertension, benign 01/04/2008  . Backache 01/04/2008  . Hypertriglyceridemia 07/15/2007  . Anxiety state 11/13/2006  . Coronary atherosclerosis 11/13/2006  . GASTROESOPHAGEAL REFLUX, NO ESOPHAGITIS 11/13/2006   - Medications: reviewed and updated   Objective:   Physical Exam BP 120/82   Pulse (!) 55   Ht 6' (1.829 m)   Wt 215 lb 4 oz (97.6 kg)   SpO2 96%   BMI 29.19 kg/m  Gen: NAD, alert, cooperative with exam, well-appearing CV: RRR, good S1/S2, no murmur Resp: CTABL, no wheezes, non-labored Skin: pearlescent lesion on patient's left lateral cheek, no evidence of skin breakdown or infection Psych: good  insight, alert and oriented        Assessment & Plan:   Skin lesion of cheek Appears to be concerning for basal cell carcinoma.  Patient would benefit from shave biopsy.  Counseled patient that he does not need to continue using Neosporin and that although this is not an emergency, he should have this removed in the near future to prevent it from growing much larger.  He understands that it will need to be looked at through a microscope to get the definitive diagnosis and to ensure that his margins are clear.  Will schedule him in the dermatology clinic for removal.  Plantar fasciitis, bilateral Patient was counseled that this will improve with time, although it sometimes takes over a year for this improvement to occur.  Encouraged patient to freeze tennis balls and rolled them under the soles of his feet to provide relief from inflammation.  Gave patient a handout on plantar fasciitis and more stretches that he can incorporate into his daily schedule.    Maia Breslow, M.D. 05/17/2019, 1:40 PM PGY-3, [place]

## 2019-06-07 ENCOUNTER — Other Ambulatory Visit: Payer: Self-pay

## 2019-06-07 ENCOUNTER — Ambulatory Visit (INDEPENDENT_AMBULATORY_CARE_PROVIDER_SITE_OTHER): Payer: Medicare Other | Admitting: Family Medicine

## 2019-06-07 ENCOUNTER — Encounter: Payer: Self-pay | Admitting: Family Medicine

## 2019-06-07 VITALS — BP 118/82 | HR 56 | Wt 212.6 lb

## 2019-06-07 DIAGNOSIS — R739 Hyperglycemia, unspecified: Secondary | ICD-10-CM

## 2019-06-07 DIAGNOSIS — G8929 Other chronic pain: Secondary | ICD-10-CM | POA: Diagnosis not present

## 2019-06-07 DIAGNOSIS — I7300 Raynaud's syndrome without gangrene: Secondary | ICD-10-CM

## 2019-06-07 LAB — POCT GLYCOSYLATED HEMOGLOBIN (HGB A1C): Hemoglobin A1C: 5.5 % (ref 4.0–5.6)

## 2019-06-07 MED ORDER — GABAPENTIN 300 MG PO CAPS: 300.0000 mg | ORAL_CAPSULE | Freq: Every day | ORAL | 0 refills | Status: DC

## 2019-06-07 NOTE — Patient Instructions (Signed)
It was a pleasure to see you today! Thank you for choosing [facility] for your primary care. Corey Ranch Sr. was seen for foot pain. Come back to the clinic next month and see your primary care doctor.   Thank you for coming in today, likely your A1c test did not show that you have prediabetes.  Please continue to watch your diet to help stay at this range.  Regard to your foot pain we can add gabapentin 300 once per day.  We also like you to go see the vascular surgeon to discuss your vascular issues and the coldness in your hands and feet.  The ABI using blood pressure cuff that we did in the office did not show problems but they may be distal to that.   Please bring all your medications to every doctors visit   Sign up for My Chart to have easy access to your labs results, and communication with your Primary care physician.     Please check-out at the front desk before leaving the clinic.     Best,  Dr. Sherene Sires FAMILY MEDICINE RESIDENT - PGY3 06/07/2019 2:24 PM

## 2019-06-07 NOTE — Progress Notes (Signed)
Subjective:  Corey Watts. is a 63 y.o. male who presents to the [facility] today with a chief complaint of foot pain.   HPI: Encounter for chronic pain management Patient describing a neuropathic-like sharp stabbing pain through his feet.  Not describing a radiculopathy from his back.  He denies any specific injuries, denies any paralysis or paresthesia.  Says this is been a long-term issue for him, he wonders if this is related to a potentially new diabetic diagnosis that he says he got from home health nurse and would like that checked as well.  He does also have a nods which means his hands and feet are always cold.  He does not have any new wounds on his feet  Raynaud phenomenon Patient denies any recent change in status which is "my feet and hands are always freezing ".  He says he does not have any neurological deficits but his extremities are always in pain.  Hyperglycemia Patient with minimal history of hyperglycemia, says a home health nurse told him that his A1c was 5.8 and that he was supposed to talk to [place] about diabetes.   Objective:  Physical Exam: BP 118/82   Pulse (!) 56   Wt 212 lb 9.6 oz (96.4 kg)   SpO2 98%   BMI 28.83 kg/m   Gen: NAD, pleasant and conversing comfortably, overweight CV: RRR with no murmurs appreciated, good cap refill but very cool hands and feet, pulses feel intact, screening ABI with blood pressure cuffs was within normal limits. Pulm: NWOB, CTAB with no crackles, wheezes, or rhonchi MSK: no edema, cyanosis, or clubbing noted, cool hands and feet but with no motor deficits and no wounding. Skin: warm, dry Neuro: grossly normal, moves all extremities Psych: Normal affect and thought content  Results for orders placed or performed in visit on 06/07/19 (from the past 72 hour(s))  HgB A1c     Status: None   Collection Time: 06/07/19  2:19 PM  Result Value Ref Range   Hemoglobin A1C 5.5 4.0 - 5.6 %   HbA1c POC (<> result, manual entry)     HbA1c, POC (prediabetic range)     HbA1c, POC (controlled diabetic range)       Assessment/Plan:  Encounter for chronic pain management Patient describing a neuropathic-like sharp stabbing pain through his feet.  Not describing a radiculopathy from his back.   Likely neuropathy, could be long-term complication from reynauds? as A1c was below prediabetic range.  Will offer gabapentin once daily to see if this is helpful.  Patient can come back and discuss with PCP in about a month  Raynaud phenomenon Patient with pulses and cap refill intact, but with very cool feet and hands.  Says he does have hand and foot pain all the time.  In office ABI using blood pressure cuffs did not show any deficits but it is possible that they are distal to the cuffs.  Referral to vascular surgery to evaluate for any other peripheral artery disease, upon further discussion with PCP there might be room to exchange metoprolol for calcium channel blocker which she will discuss with him at his next visit.  Hyperglycemia Patient with minimal history of hyperglycemia, says a home health nurse told him that his A1c was 5.8 and that he was supposed to talk to [place] about diabetes.  Patient is not prediabetic or diabetic, A1c in office was 5.5.  We discussed dietary changes that could help avoid transition to diabetes in the  future.  No medical intervention at this time patient declines nutrition referral   Sherene Sires, [place] - PGY3 06/08/2019 7:48 AM

## 2019-06-08 ENCOUNTER — Telehealth: Payer: Self-pay

## 2019-06-08 DIAGNOSIS — R739 Hyperglycemia, unspecified: Secondary | ICD-10-CM | POA: Insufficient documentation

## 2019-06-08 NOTE — Telephone Encounter (Signed)
-----   Message from Sherene Sires, DO sent at 06/08/2019  7:51 AM EDT ----- [facility] pool, please schedule patient a f/u with Dr. Shan Levans (I understand she is booked for awhile) to check in with her about foot pain/reynauds and discussion medication further

## 2019-06-08 NOTE — Telephone Encounter (Signed)
Appt scheduled.Clark Clowdus Zimmerman Rumple d

## 2019-06-08 NOTE — Assessment & Plan Note (Signed)
Patient with pulses and cap refill intact, but with very cool feet and hands.  Says he does have hand and foot pain all the time.  In office ABI using blood pressure cuffs did not show any deficits but it is possible that they are distal to the cuffs.  Referral to vascular surgery to evaluate for any other peripheral artery disease, upon further discussion with PCP there might be room to exchange metoprolol for calcium channel blocker which she will discuss with him at his next visit.

## 2019-06-08 NOTE — Assessment & Plan Note (Signed)
Patient describing a neuropathic-like sharp stabbing pain through his feet.  Not describing a radiculopathy from his back.   Likely neuropathy, could be long-term complication from reynauds? as A1c was below prediabetic range.  Will offer gabapentin once daily to see if this is helpful.  Patient can come back and discuss with PCP in about a month

## 2019-06-08 NOTE — Assessment & Plan Note (Signed)
Patient with minimal history of hyperglycemia, says a home health nurse told him that his A1c was 5.8 and that he was supposed to talk to [place] about diabetes.  Patient is not prediabetic or diabetic, A1c in office was 5.5.  We discussed dietary changes that could help avoid transition to diabetes in the future.  No medical intervention at this time patient declines nutrition referral

## 2019-06-11 ENCOUNTER — Other Ambulatory Visit: Payer: Self-pay

## 2019-06-12 ENCOUNTER — Other Ambulatory Visit: Payer: Self-pay | Admitting: Family Medicine

## 2019-06-12 MED ORDER — ISOSORBIDE MONONITRATE ER 60 MG PO TB24: 60.0000 mg | ORAL_TABLET | Freq: Every day | ORAL | 3 refills | Status: DC

## 2019-06-15 ENCOUNTER — Ambulatory Visit (INDEPENDENT_AMBULATORY_CARE_PROVIDER_SITE_OTHER): Payer: Medicare Other | Admitting: Family Medicine

## 2019-06-15 ENCOUNTER — Other Ambulatory Visit: Payer: Self-pay

## 2019-06-15 ENCOUNTER — Encounter: Payer: Self-pay | Admitting: Family Medicine

## 2019-06-15 VITALS — BP 126/70 | HR 58 | Ht 72.0 in | Wt 216.5 lb

## 2019-06-15 DIAGNOSIS — G6289 Other specified polyneuropathies: Secondary | ICD-10-CM

## 2019-06-15 DIAGNOSIS — L989 Disorder of the skin and subcutaneous tissue, unspecified: Secondary | ICD-10-CM | POA: Diagnosis not present

## 2019-06-15 DIAGNOSIS — I7300 Raynaud's syndrome without gangrene: Secondary | ICD-10-CM | POA: Diagnosis not present

## 2019-06-15 DIAGNOSIS — G622 Polyneuropathy due to other toxic agents: Secondary | ICD-10-CM

## 2019-06-15 MED ORDER — METOPROLOL SUCCINATE ER 50 MG PO TB24: 50.0000 mg | ORAL_TABLET | Freq: Every day | ORAL | 3 refills | Status: DC

## 2019-06-15 MED ORDER — AMLODIPINE BESYLATE 10 MG PO TABS: 5.0000 mg | ORAL_TABLET | Freq: Every day | ORAL | 3 refills | Status: DC

## 2019-06-15 MED ORDER — GABAPENTIN 300 MG PO CAPS: 300.0000 mg | ORAL_CAPSULE | Freq: Two times a day (BID) | ORAL | 11 refills | Status: DC

## 2019-06-15 NOTE — Patient Instructions (Addendum)
It was nice seeing you today Mr. Daws!  Your foot pain is caused by neuropathy, which means that your nerve are not functioning like they should be.  We will increase your gabapentin to 2 pills/day.  Please experiment with this medicine to see if it makes you feel drowsy.  If not, you can take 1 pill in the morning and 1 pill at night.  We can always increase this dose even more if needed.  I am sending you a month supply with 11 refills.  We will also increase your amlodipine to help with your Raynaud's disease.  You will still take 1 pill/day.  Please stop your twice daily metoprolol and take the new metoprolol which is a once a day medicine at a lower dose.  I am sending a note to our office staff to schedule you either with me or in our Derm clinic to get your basal cell removed.  If you have any questions or concerns, please feel free to call the clinic.   Be well,  Dr. Shan Levans

## 2019-06-15 NOTE — Progress Notes (Signed)
   Subjective:    Corey Watts. - 63 y.o. male MRN [id number]  Date of birth: 10-04-1955  CC:  Corey SHIRER Sr. is here for follow up of bilateral foot pain.  HPI: Bilateral foot pain Has been present for about a year or maybe more Exacerbating factors: standing on his feet Alleviating factors: none Numbness and tingling accompanied by pins-and-needles and burning pain Has had lumbar back surgery but denies radiculopathy "like walking on a sponge" Also has Raynaud's disease so will get pallor and cold sensation of his bilateral hands and feet Recent hemoglobin A1c 5.5, no history of diabetes Gabapentin 300 mg nightly has been helpful  Health Maintenance:  There are no preventive care reminders to display for this patient.  -  reports that he has never smoked. He has never used smokeless tobacco. - Review of Systems: Per HPI. - Past Medical History: Patient Active Problem List   Diagnosis Date Noted  . Hyperglycemia 06/08/2019  . Plantar fasciitis, bilateral 05/17/2019  . Skin lesion of cheek 04/06/2019  . Acute non-recurrent maxillary sinusitis 10/13/2018  . Raynaud phenomenon 05/23/2017  . Peripheral neuropathy 11/28/2015  . Encounter for chronic pain management 10/11/2014  . Insomnia 06/30/2013  . Gout 12/24/2012  . Allergic rhinitis 11/27/2009  . OVERWEIGHT 09/18/2009  . Essential hypertension, benign 01/04/2008  . Backache 01/04/2008  . Hypertriglyceridemia 07/15/2007  . Anxiety state 11/13/2006  . Coronary atherosclerosis 11/13/2006  . GASTROESOPHAGEAL REFLUX, NO ESOPHAGITIS 11/13/2006   - Medications: reviewed and updated   Objective:   Physical Exam BP 126/70   Pulse (!) 58   Ht 6' (1.829 m)   Wt 216 lb 8 oz (98.2 kg)   SpO2 95%   BMI 29.36 kg/m  Gen: NAD, alert, cooperative with exam, well-appearing, pleasant Skin: Pearlescent lesion on left cheek, stable from previous examination Feet: patient's feet were insensate to monofilament testing  bilaterally today.  DP and PT pulses were palpated.  No visual deformity.      Assessment & Plan:   Peripheral neuropathy Patient is completely insensate to monofilament testing, and his is consistent with a diagnosis of peripheral neuropathy.  This could be possibly due to prior alcohol use disorder, although it can also be idiopathic.  Since 300 mg gabapentin nightly is helpful, we will experiment with increasing this dose as tolerated.  Patient was advised to take 300 mg during the day when he will not be driving or doing anything that would be unsafe in case he becomes drowsy since this can be a side effect.  Patient expressed understanding.    Skin lesion of cheek I sent a message to our office staff to schedule him with our Derm clinic or block of 2 slots to have this likely basal cell carcinoma removed and biopsied.  Raynaud phenomenon Will increase dose of amlodipine from 5 mg to 10 mg in order to avoid hypotension, will decrease patient's metoprolol.  He is currently taking metoprolol tartrate.  We will simplify his regimen by changing this to metoprolol succinate 50 mg once daily.    Maia Breslow, M.D. 06/16/2019, 3:49 PM PGY-3, [place]

## 2019-06-16 ENCOUNTER — Other Ambulatory Visit: Payer: Self-pay | Admitting: Family Medicine

## 2019-06-16 ENCOUNTER — Telehealth: Payer: Self-pay

## 2019-06-16 MED ORDER — AMLODIPINE BESYLATE 10 MG PO TABS: 10.0000 mg | ORAL_TABLET | Freq: Every day | ORAL | 3 refills | Status: DC

## 2019-06-16 NOTE — Assessment & Plan Note (Signed)
I sent a message to our office staff to schedule him with our Derm clinic or block of 2 slots to have this likely basal cell carcinoma removed and biopsied.

## 2019-06-16 NOTE — Telephone Encounter (Signed)
Patients wife calls nurse line about amlodipine instructions. Patient was told to increase amlodipine to 10mg . They picked up rx this morning, it is for amlodipine 10mg , however the instructions say take 1/2 tab, which would bring him back to amlodipine 5mg . Please clarify directions and dosage. Thanks!

## 2019-06-16 NOTE — Assessment & Plan Note (Signed)
Will increase dose of amlodipine from 5 mg to 10 mg in order to avoid hypotension, will decrease patient's metoprolol.  He is currently taking metoprolol tartrate.  We will simplify his regimen by changing this to metoprolol succinate 50 mg once daily.

## 2019-06-16 NOTE — Assessment & Plan Note (Signed)
Patient is completely insensate to monofilament testing, and his is consistent with a diagnosis of peripheral neuropathy.  This could be possibly due to prior alcohol use disorder, although it can also be idiopathic.  Since 300 mg gabapentin nightly is helpful, we will experiment with increasing this dose as tolerated.  Patient was advised to take 300 mg during the day when he will not be driving or doing anything that would be unsafe in case he becomes drowsy since this can be a side effect.  Patient expressed understanding.

## 2019-06-16 NOTE — Telephone Encounter (Signed)
I apologize for the confusion.  He should take 1 tab of the amlodipine 10 mg.  I have changed his prescription to reflect this.  Please let them know about this.  Thanks.

## 2019-06-16 NOTE — Telephone Encounter (Signed)
Patients wife informed of below.Corey Watts, CMA

## 2019-06-30 ENCOUNTER — Telehealth: Payer: Self-pay | Admitting: *Deleted

## 2019-06-30 NOTE — Telephone Encounter (Signed)
Pt would like to start taking Vit C but wants to make sure it will not interact with other meds first.   To PCP.  Christen Bame, CMA

## 2019-06-30 NOTE — Telephone Encounter (Signed)
Returned patient's call and left voice message saying that he does not technically need vitamin C if he is eating plenty of fruits and vegetables, but he cannot overdose on this vitamin, and it will not interact with his other medications, so he can take it if he would like.

## 2019-07-16 ENCOUNTER — Other Ambulatory Visit: Payer: Self-pay | Admitting: Family Medicine

## 2019-08-15 ENCOUNTER — Other Ambulatory Visit: Payer: Self-pay | Admitting: Family Medicine

## 2019-08-16 ENCOUNTER — Other Ambulatory Visit: Payer: Self-pay | Admitting: *Deleted

## 2019-08-16 MED ORDER — PRAVASTATIN SODIUM 40 MG PO TABS: ORAL_TABLET | ORAL | 3 refills | Status: DC

## 2019-08-18 ENCOUNTER — Other Ambulatory Visit: Payer: Self-pay | Admitting: *Deleted

## 2019-08-19 MED ORDER — BUSPIRONE HCL 30 MG PO TABS: 15.0000 mg | ORAL_TABLET | Freq: Two times a day (BID) | ORAL | 3 refills | Status: DC

## 2019-08-23 ENCOUNTER — Other Ambulatory Visit: Payer: Self-pay

## 2019-08-23 MED ORDER — CYCLOBENZAPRINE HCL 10 MG PO TABS: 10.0000 mg | ORAL_TABLET | Freq: Three times a day (TID) | ORAL | 0 refills | Status: DC | PRN

## 2019-09-13 ENCOUNTER — Other Ambulatory Visit: Payer: Self-pay | Admitting: Family Medicine

## 2019-09-23 ENCOUNTER — Telehealth: Payer: Self-pay

## 2019-09-23 NOTE — Telephone Encounter (Signed)
LVM for a return call to our office to ask if he would like to make his appt virtual for tomorrow and if not ask COVID screening questions. Also, asked them to call the office in the am to make sure we are open regular hours and not closed or on a delay due to the weather tomorrow. [name], CMA

## 2019-09-24 ENCOUNTER — Other Ambulatory Visit: Payer: Self-pay

## 2019-09-24 ENCOUNTER — Ambulatory Visit (INDEPENDENT_AMBULATORY_CARE_PROVIDER_SITE_OTHER): Payer: Medicare Other | Admitting: Family Medicine

## 2019-09-24 ENCOUNTER — Encounter: Payer: Self-pay | Admitting: Family Medicine

## 2019-09-24 VITALS — BP 140/80 | HR 68 | Wt 213.6 lb

## 2019-09-24 DIAGNOSIS — M10471 Other secondary gout, right ankle and foot: Secondary | ICD-10-CM | POA: Diagnosis not present

## 2019-09-24 MED ORDER — DICLOFENAC SODIUM 75 MG PO TBEC: 75.0000 mg | DELAYED_RELEASE_TABLET | Freq: Two times a day (BID) | ORAL | 0 refills | Status: DC

## 2019-09-24 NOTE — Progress Notes (Cosign Needed)
   Subjective:   Patient ID: Corey Ranch Sr.    DOB: 03/12/1956, 64 y.o. male   MRN: [id number]  Corey LOOS Sr. is a 64 y.o. male here for right toe pain.  Right toe pain: Patient notes he has history of gout in his right knee. He notes the Sunday after Christmas (12/27) his toe began to hurt a lot. Notes they ate lots of rich foods for the Christmas holidays. Denies any excessive alcohol use - drinks at most 1-2 beers a day. Denies any redness or inflammation. Hurts a lot with any movement. Hurts a lot when his toe touch the covers at night. Has treated with Colchicine (his wife's rx), Vicoden, Tylenol, Ibuprofen 400mg  at a time without any improvement. Denies any fevers or chills.   Review of Systems:  Per HPI.   [place], medications and smoking status reviewed.  Objective:   BP 140/80   Pulse 68   Wt 213 lb 9.6 oz (96.9 kg)   SpO2 95%   BMI 28.97 kg/m  Vitals and nursing note reviewed.  General: pleasant older male, well nourished, well developed, in no acute distress with non-toxic appearance Resp: Breathing comfortably on room air, speaking in full sentences Skin: warm, dry, mildly erythematous right great toe  Extremities: warm and well perfused, thickened yellow colored toe nails appreciated on right toe nails MSK: Right great toe very tender to palpation at MTP joint, no tophi appreciated       Assessment & Plan:   Gout Right great toe pain appears most consistent with gout flare although would have expected it to improve by now. Do not feel infectious etiology given physical exam findings. Pseudogout possible however fortunately treatment is the same. Not adequate for aspiration at this time. Opted to treat with Diclofenac 75mg  BID x 7 days. Patient instructed to RTC if no improvement after treatment or if becomes worse despite treatment, develops fevers or chills. The nature of gout was fully explained, including dietary relationship, acute and interval phase and  treatment of both. Long term complications such as kidney stones, tophi and arthritis are discussed. Avoidance of alcohol recommended. Indications for the use of allopurinol for prophylaxis was discussed. At follow up visit, consider baseline uric acid level and starting prophylaxis Allopurinol.   Meds ordered this encounter  Medications  . diclofenac (VOLTAREN) 75 MG EC tablet    Sig: Take 1 tablet (75 mg total) by mouth 2 (two) times daily for 7 days.    Dispense:  14 tablet    Refill:  0    Mina Marble, DO PGY-2, [facility] 09/26/2019 4:30 PM

## 2019-09-24 NOTE — Patient Instructions (Signed)
Please take Diclofenac 75mg : Take 1 pill twice a day for 7 days.  Follow up with your PCP if no improvement in 10-14 days. I also recommend seeing your PCP to discuss starting a preventative medicine.  Take care and stay warm, Dr. Tarry Kos  Gout  Gout is painful swelling of your joints. Gout is a type of arthritis. It is caused by having too much uric acid in your body. Uric acid is a chemical that is made when your body breaks down substances called purines. If your body has too much uric acid, sharp crystals can form and build up in your joints. This causes pain and swelling. Gout attacks can happen quickly and be very painful (acute gout). Over time, the attacks can affect more joints and happen more often (chronic gout). What are the causes?  Too much uric acid in your blood. This can happen because: ? Your kidneys do not remove enough uric acid from your blood. ? Your body makes too much uric acid. ? You eat too many foods that are high in purines. These foods include organ meats, some seafood, and beer.  Trauma or stress. What increases the risk?  Having a family history of gout.  Being male and middle-aged.  Being male and having gone through menopause.  Being very overweight (obese).  Drinking alcohol, especially beer.  Not having enough water in the body (being dehydrated).  Losing weight too quickly.  Having an organ transplant.  Having lead poisoning.  Taking certain medicines.  Having kidney disease.  Having a skin condition called psoriasis. What are the signs or symptoms? An attack of acute gout usually happens in just one joint. The most common place is the big toe. Attacks often start at night. Other joints that may be affected include joints of the feet, ankle, knee, fingers, wrist, or elbow. Symptoms of an attack may include:  Very bad pain.  Warmth.  Swelling.  Stiffness.  Shiny, red, or purple skin.  Tenderness. The affected joint may be very  painful to touch.  Chills and fever. Chronic gout may cause symptoms more often. More joints may be involved. You may also have white or yellow lumps (tophi) on your hands or feet or in other areas near your joints. How is this treated?  Treatment for this condition has two phases: treating an acute attack and preventing future attacks.  Acute gout treatment may include: ? NSAIDs. ? Steroids. These are taken by mouth or injected into a joint. ? Colchicine. This medicine relieves pain and swelling. It can be given by mouth or through an IV tube.  Preventive treatment may include: ? Taking small doses of NSAIDs or colchicine daily. ? Using a medicine that reduces uric acid levels in your blood. ? Making changes to your diet. You may need to see a food expert (dietitian) about what to eat and drink to prevent gout. Follow these instructions at home: During a gout attack   If told, put ice on the painful area: ? Put ice in a plastic bag. ? Place a towel between your skin and the bag. ? Leave the ice on for 20 minutes, 2-3 times a day.  Raise (elevate) the painful joint above the level of your heart as often as you can.  Rest the joint as much as possible. If the joint is in your leg, you may be given crutches.  Follow instructions from your doctor about what you cannot eat or drink. Avoiding future gout attacks  Eat a low-purine diet. Avoid foods and drinks such as: ? Liver. ? Kidney. ? Anchovies. ? Asparagus. ? Herring. ? Mushrooms. ? Mussels. ? Beer.  Stay at a healthy weight. If you want to lose weight, talk with your doctor. Do not lose weight too fast.  Start or continue an exercise plan as told by your doctor. Eating and drinking  Drink enough fluids to keep your pee (urine) pale yellow.  If you drink alcohol: ? Limit how much you use to:  0-1 drink a day for women.  0-2 drinks a day for men. ? Be aware of how much alcohol is in your drink. In the [place] one  drink equals one 12 oz bottle of beer (355 mL), one 5 oz glass of wine (148 mL), or one 1 oz glass of hard liquor (44 mL). General instructions  Take over-the-counter and prescription medicines only as told by your doctor.  Do not drive or use heavy machinery while taking prescription pain medicine.  Return to your normal activities as told by your doctor. Ask your doctor what activities are safe for you.  Keep all follow-up visits as told by your doctor. This is important. Contact a doctor if:  You have another gout attack.  You still have symptoms of a gout attack after 10 days of treatment.  You have problems (side effects) because of your medicines.  You have chills or a fever.  You have burning pain when you pee (urinate).  You have pain in your lower back or belly. Get help right away if:  You have very bad pain.  Your pain cannot be controlled.  You cannot pee. Summary  Gout is painful swelling of the joints.  The most common site of pain is the big toe, but it can affect other joints.  Medicines and avoiding some foods can help to prevent and treat gout attacks. This information is not intended to replace advice given to you by your health care provider. Make sure you discuss any questions you have with your health care provider. Document Revised: 03/25/2018 Document Reviewed: 03/25/2018 Elsevier Patient Education  [place].

## 2019-09-26 NOTE — Assessment & Plan Note (Addendum)
Right great toe pain appears most consistent with gout flare although would have expected it to improve by now. Do not feel infectious etiology given physical exam findings. Pseudogout possible however fortunately treatment is the same. Not adequate for aspiration at this time. Opted to treat with Diclofenac 75mg  BID x 7 days. Patient instructed to RTC if no improvement after treatment or if becomes worse despite treatment, develops fevers or chills. The nature of gout was fully explained, including dietary relationship, acute and interval phase and treatment of both. Long term complications such as kidney stones, tophi and arthritis are discussed. Avoidance of alcohol recommended. Indications for the use of allopurinol for prophylaxis was discussed. At follow up visit, consider baseline uric acid level and starting prophylaxis Allopurinol.

## 2019-09-28 ENCOUNTER — Other Ambulatory Visit: Payer: Self-pay | Admitting: Family Medicine

## 2019-09-28 DIAGNOSIS — M10471 Other secondary gout, right ankle and foot: Secondary | ICD-10-CM

## 2019-10-04 ENCOUNTER — Telehealth: Payer: Self-pay

## 2019-10-04 NOTE — Telephone Encounter (Signed)
Patient calls nurse line stating the Buspar that was called in is the wrong one. Patient stated that is an old rx and he takes 30mg  2x daily (60mg  total), not 15mg  2x daily. Please advise.

## 2019-10-05 ENCOUNTER — Other Ambulatory Visit: Payer: Self-pay | Admitting: Family Medicine

## 2019-10-05 MED ORDER — BUSPIRONE HCL 30 MG PO TABS: 30.0000 mg | ORAL_TABLET | Freq: Two times a day (BID) | ORAL | 3 refills | Status: DC

## 2019-10-05 NOTE — Telephone Encounter (Signed)
LVM saying a new Rx has been sent to replace the old Rx. [name], CMA

## 2019-10-05 NOTE — Telephone Encounter (Signed)
Please let Corey Watts know that I have sent the correct version of this prescription to his pharmacy and that I apologize for the error.  Thanks.

## 2019-10-12 ENCOUNTER — Other Ambulatory Visit: Payer: Self-pay

## 2019-10-12 MED ORDER — TRAZODONE HCL 50 MG PO TABS: ORAL_TABLET | ORAL | 11 refills | Status: DC

## 2019-10-18 ENCOUNTER — Other Ambulatory Visit: Payer: Self-pay

## 2019-10-18 MED ORDER — MONTELUKAST SODIUM 10 MG PO TABS: 10.0000 mg | ORAL_TABLET | Freq: Every day | ORAL | 2 refills | Status: DC

## 2019-11-17 ENCOUNTER — Encounter: Payer: Self-pay | Admitting: Family Medicine

## 2019-11-17 ENCOUNTER — Other Ambulatory Visit: Payer: Self-pay

## 2019-11-17 ENCOUNTER — Ambulatory Visit (INDEPENDENT_AMBULATORY_CARE_PROVIDER_SITE_OTHER): Payer: Medicare Other | Admitting: Family Medicine

## 2019-11-17 VITALS — BP 128/76 | HR 75 | Wt 211.4 lb

## 2019-11-17 DIAGNOSIS — M10061 Idiopathic gout, right knee: Secondary | ICD-10-CM

## 2019-11-17 DIAGNOSIS — M545 Low back pain: Secondary | ICD-10-CM

## 2019-11-17 DIAGNOSIS — G8929 Other chronic pain: Secondary | ICD-10-CM | POA: Insufficient documentation

## 2019-11-17 DIAGNOSIS — M25551 Pain in right hip: Secondary | ICD-10-CM

## 2019-11-17 DIAGNOSIS — M5442 Lumbago with sciatica, left side: Secondary | ICD-10-CM | POA: Insufficient documentation

## 2019-11-17 DIAGNOSIS — E781 Pure hyperglyceridemia: Secondary | ICD-10-CM | POA: Diagnosis not present

## 2019-11-17 DIAGNOSIS — I10 Essential (primary) hypertension: Secondary | ICD-10-CM

## 2019-11-17 DIAGNOSIS — Z0000 Encounter for general adult medical examination without abnormal findings: Secondary | ICD-10-CM | POA: Insufficient documentation

## 2019-11-17 MED ORDER — HYDROCODONE-ACETAMINOPHEN 5-325 MG PO TABS: 1.0000 | ORAL_TABLET | Freq: Two times a day (BID) | ORAL | 0 refills | Status: DC | PRN

## 2019-11-17 MED ORDER — IBUPROFEN 800 MG PO TABS: 800.0000 mg | ORAL_TABLET | Freq: Three times a day (TID) | ORAL | 0 refills | Status: DC | PRN

## 2019-11-17 MED ORDER — ALLOPURINOL 100 MG PO TABS: 100.0000 mg | ORAL_TABLET | Freq: Every day | ORAL | 6 refills | Status: DC

## 2019-11-17 NOTE — Assessment & Plan Note (Signed)
Will measure uric acid level today and start allopurinol.  We will also prescribe ibuprofen 800 mg in anticipation of a future gout attack.  Patient understands that he should take ibuprofen with food and only as needed.

## 2019-11-17 NOTE — Assessment & Plan Note (Signed)
Represcribed patient's Vicodin.  It appears that he only takes this sparingly.  PDMP was reviewed.  Had been prescribed 3 prescriptions at one time with different fill dates by his previous provider last year, but since this practice can be confusing to other providers looking at his chart, will send only 1 prescription at a time and refill when necessary.

## 2019-11-17 NOTE — Assessment & Plan Note (Signed)
Well-controlled.  Will check a BMP today.

## 2019-11-17 NOTE — Assessment & Plan Note (Signed)
Given distribution of patient's pain and likelihood of irritation from overuse, this is the most likely diagnosis currently.  Patient was given rehabilitation exercises and encouraged to use a heating pad, Tylenol, and NSAIDs as needed for relief.  If patient's pain does not respond to these measures, will reevaluate in about 1 month.

## 2019-11-17 NOTE — Assessment & Plan Note (Signed)
Counseled patient on the potential inaccuracies of a PSA but told him that it was the best screening test available for prostate cancer.  He is at lower risk due to no family history.  Patient elected to have this checked today.

## 2019-11-17 NOTE — Assessment & Plan Note (Signed)
Will check fasting lipid profile today.

## 2019-11-17 NOTE — Patient Instructions (Addendum)
It was nice seeing you today Corey Watts!  You may have greater trochanteric pain syndrome, also known as hip bursitis.  Please try these exercises 10 repetitions, 3 sets almost every day if possible.  If you continue to have pain in this area, we need to look at other causes such as nerve entrapment.  Please make an appointment to see our dermatology clinic on Thursday afternoon about the place on your left cheek.  I am refilling your Vicodin today.  We are checking your cholesterol and electrolytes and kidney function as well as a PSA, which is a screening test for prostate cancer.  I will let you know if any of these results are abnormal.  If you have any questions or concerns, please feel free to call the clinic.   Be well,  Dr. Shan Levans   Hip Bursitis  Hip bursitis is inflammation of a fluid-filled sac (bursa) in the hip joint. The bursa prevents the bones in the hip joint from rubbing against each other. Hip bursitis can cause mild to moderate pain, and symptoms often come and go over time. What are the causes? This condition may be caused by:  Injury to the hip.  Overuse of the muscles that surround the hip joint.  Previous injury or surgery of the hip.  Arthritis or gout.  Diabetes.  Thyroid disease.  Infection. In some cases, the cause may not be known. What are the signs or symptoms? Symptoms of this condition include:  Mild or moderate pain in the hip area. Pain may get worse with movement.  Tenderness and swelling of the hip, especially on the outer side of the hip.  In rare cases, the bursa may become infected. This may cause a fever, as well as warmth and redness in the area. Symptoms may come and go. How is this diagnosed? This condition may be diagnosed based on:  A physical exam.  Your medical history.  X-rays.  Removal of fluid from your inflamed bursa for testing (biopsy). You may be sent to a health care provider who specializes in bone diseases  (orthopedist) or a provider who specializes in joint inflammation (rheumatologist). How is this treated? This condition is treated by resting, icing, applying pressure (compression), and raising (elevating) the injured area. This is called RICE treatment. In some cases, this may be enough to make your symptoms go away. Treatment may also include:  Using crutches.  Draining fluid out of the bursa to help relieve swelling.  Injecting medicine that helps to reduce inflammation (cortisone).  Additional medicines if the bursa is infected. Follow these instructions at home: Managing pain, stiffness, and swelling   If directed, put ice on the painful area. ? Put ice in a plastic bag. ? Place a towel between your skin and the bag. ? Leave the ice on for 20 minutes, 2-3 times a day. ? Raise (elevate) your hip above the level of your heart as much as you can without pain. To do this, try putting a pillow under your hips while you lie down. Activity  Return to your normal activities as told by your health care provider. Ask your health care provider what activities are safe for you.  Rest and protect your hip as much as possible until your pain and swelling get better. General instructions  Take over-the-counter and prescription medicines only as told by your health care provider.  Wear compression wraps only as told by your health care provider.  Do not use your hip to  support your body weight until your health care provider says that you can. Use crutches as told by your health care provider.  Gently massage and stretch your injured area as often as is comfortable.  Keep all follow-up visits as told by your health care provider. This is important. How is this prevented?  Exercise regularly, as told by your health care provider.  Warm up and stretch before being active.  Cool down and stretch after being active.  If an activity irritates your hip or causes pain, avoid the activity  as much as possible.  Avoid sitting down for long periods at a time. Contact a health care provider if you:  Have a fever.  Develop new symptoms.  Have difficulty walking or doing everyday activities.  Have pain that gets worse or does not get better with medicine.  Develop red skin or a feeling of warmth in your hip area. Get help right away if you:  Cannot move your hip.  Have severe pain. Summary  Hip bursitis is inflammation of a fluid-filled sac (bursa) in the hip joint.  Hip bursitis can cause mild to moderate pain, and symptoms often come and go over time.  This condition is treated with rest, ice, compression, elevation, and medicines. This information is not intended to replace advice given to you by your health care provider. Make sure you discuss any questions you have with your health care provider. Document Revised: 05/11/2018 Document Reviewed: 05/11/2018 Elsevier Patient Education  2020 [place].  Hip Bursitis Rehab Ask your health care provider which exercises are safe for you. Do exercises exactly as told by your health care provider and adjust them as directed. It is normal to feel mild stretching, pulling, tightness, or discomfort as you do these exercises. Stop right away if you feel sudden pain or your pain gets worse. Do not begin these exercises until told by your health care provider. Stretching exercise This exercise warms up your muscles and joints and improves the movement and flexibility of your hip. This exercise also helps to relieve pain and stiffness. Iliotibial band stretch An iliotibial band is a strong band of muscle tissue that runs from the outer side of your hip to the outer side of your thigh and knee. 1. Lie on your side with your left / right leg in the top position. 2. Bend your left / right knee and grab your ankle. Stretch out your bottom arm to help you balance. 3. Slowly bring your knee back so your thigh is behind your  body. 4. Slowly lower your knee toward the floor until you feel a gentle stretch on the outside of your left / right thigh. If you do not feel a stretch and your knee will not fall farther, place the heel of your other foot on top of your knee and pull your knee down toward the floor with your foot. 5. Hold this position for __________ seconds. 6. Slowly return to the starting position. Repeat __________ times. Complete this exercise __________ times a day. Strengthening exercises These exercises build strength and endurance in your hip and pelvis. Endurance is the ability to use your muscles for a long time, even after they get tired. Bridge This exercise strengthens the muscles that move your thigh backward (hip extensors). 1. Lie on your back on a firm surface with your knees bent and your feet flat on the floor. 2. Tighten your buttocks muscles and lift your buttocks off the floor until your trunk is  level with your thighs. ? Do not arch your back. ? You should feel the muscles working in your buttocks and the back of your thighs. If you do not feel these muscles, slide your feet 1-2 inches (2.5-5 cm) farther away from your buttocks. ? If this exercise is too easy, try doing it with your arms crossed over your chest. 3. Hold this position for __________ seconds. 4. Slowly lower your hips to the starting position. 5. Let your muscles relax completely after each repetition. Repeat __________ times. Complete this exercise __________ times a day. Squats This exercise strengthens the muscles in front of your thigh and knee (quadriceps). 1. Stand in front of a table, with your feet and knees pointing straight ahead. You may rest your hands on the table for balance but not for support. 2. Slowly bend your knees and lower your hips like you are going to sit in a chair. ? Keep your weight over your heels, not over your toes. ? Keep your lower legs upright so they are parallel with the table  legs. ? Do not let your hips go lower than your knees. ? Do not bend lower than told by your health care provider. ? If your hip pain increases, do not bend as low. 3. Hold the squat position for __________ seconds. 4. Slowly push with your legs to return to standing. Do not use your hands to pull yourself to standing. Repeat __________ times. Complete this exercise __________ times a day. Hip hike 1. Stand sideways on a bottom step. Stand on your left / right leg with your other foot unsupported next to the step. You can hold on to the railing or wall for balance if needed. 2. Keep your knees straight and your torso square. Then lift your left / right hip up toward the ceiling. 3. Hold this position for __________ seconds. 4. Slowly let your left / right hip lower toward the floor, past the starting position. Your foot should get closer to the floor. Do not lean or bend your knees. Repeat __________ times. Complete this exercise __________ times a day. Single leg stand 1. Without shoes, stand near a railing or in a doorway. You may hold on to the railing or door frame as needed for balance. 2. Squeeze your left / right buttock muscles, then lift up your other foot. ? Do not let your left / right hip push out to the side. ? It is helpful to stand in front of a mirror for this exercise so you can watch your hip. 3. Hold this position for __________ seconds. Repeat __________ times. Complete this exercise __________ times a day. This information is not intended to replace advice given to you by your health care provider. Make sure you discuss any questions you have with your health care provider. Document Revised: 12/28/2018 Document Reviewed: 12/28/2018 Elsevier Patient Education  [place].

## 2019-11-17 NOTE — Progress Notes (Addendum)
    SUBJECTIVE:   CHIEF COMPLAINT / HPI:   R leg pain Started in the R hip on the outside and radiates now to the ankle Started on Saturday, no known trigger Chronic bilateral numbness and tingling in feet, nothing new Now more on lateral R knee Felt knee "give out" due to pain Needs vicodin refill  Gout Recently had another gout flare, which is the second one he has had in a short period of time Ibuprofen 800 mg was very effective for treatment of this flare Has never tried allopurinol as a preventive measure  Skin lesion Continues to have a flesh-colored lesion on his left cheek Denies any bleeding or growth of this lesion over the last few months Had plan to get this removed at our dermatology clinic, but this appointment was not made  PERTINENT  PMH / PSH: obesity, hyperlipidemia, hypertension, peripheral neuropathy, gout  OBJECTIVE:   BP 128/76   Pulse 75   Wt 211 lb 6.4 oz (95.9 kg)   SpO2 98%   BMI 28.67 kg/m   General: well appearing, appears stated age Cardiac: RRR, no MRG Respiratory: CTAB, no rhonchi, rales, or wheezing, normal work of breathing Skin: Flesh-colored, pearlescent lesion with smooth, well-circumscribed border on left cheek, warm and well perfused Extremities - right hip and thigh: No visual abnormality.  Mildly tender to palpation on the lateral thigh.  Hip ROM significantly reduced on flexion, FADIR, and FABER, although no hip pain elicited during these movements.  5/5 strength on hip flexion, extension, abduction, and abduction.  Neurovascularly intact. Psych: appropriate mood and affect   ASSESSMENT/PLAN:   Essential hypertension, benign Well-controlled.  Will check a BMP today.  Gout Will measure uric acid level today and start allopurinol.  We will also prescribe ibuprofen 800 mg in anticipation of a future gout attack.  Patient understands that he should take ibuprofen with food and only as needed.  Hypertriglyceridemia Will check  fasting lipid profile today.  Chronic back pain Represcribed patient's Vicodin.  It appears that he only takes this sparingly.  PDMP was reviewed.  Had been prescribed 3 prescriptions at one time with different fill dates by his previous provider last year, but since this practice can be confusing to other providers looking at his chart, will send only 1 prescription at a time and refill when necessary.  Healthcare maintenance Counseled patient on the potential inaccuracies of a PSA but told him that it was the best screening test available for prostate cancer.  He is at lower risk due to no family history.  Patient elected to have this checked today.  Greater trochanteric pain syndrome of right lower extremity Given distribution of patient's pain and likelihood of irritation from overuse, this is the most likely diagnosis currently.  Patient was given rehabilitation exercises and encouraged to use a heating pad, Tylenol, and NSAIDs as needed for relief.  If patient's pain does not respond to these measures, will reevaluate in about 1 month.     Kathrene Alu, MD [place]

## 2019-11-18 ENCOUNTER — Telehealth: Payer: Self-pay | Admitting: *Deleted

## 2019-11-18 LAB — BASIC METABOLIC PANEL
BUN/Creatinine Ratio: 11 (ref 10–24)
BUN: 11 mg/dL (ref 8–27)
CO2: 22 mmol/L (ref 20–29)
Calcium: 9.9 mg/dL (ref 8.6–10.2)
Chloride: 97 mmol/L (ref 96–106)
Creatinine, Ser: 1.03 mg/dL (ref 0.76–1.27)
GFR calc Af Amer: 89 mL/min/{1.73_m2} (ref >59)
GFR calc non Af Amer: 77 mL/min/{1.73_m2} (ref >59)
Glucose: 92 mg/dL (ref 65–99)
Potassium: 4.6 mmol/L (ref 3.5–5.2)
Sodium: 141 mmol/L (ref 134–144)

## 2019-11-18 LAB — LIPID PANEL
Chol/HDL Ratio: 5 ratio (ref 0.0–5.0)
Cholesterol, Total: 174 mg/dL (ref 100–199)
HDL: 35 mg/dL — ABNORMAL LOW (ref >39)
LDL Chol Calc (NIH): 97 mg/dL (ref 0–99)
Triglycerides: 244 mg/dL — ABNORMAL HIGH (ref 0–149)
VLDL Cholesterol Cal: 42 mg/dL — ABNORMAL HIGH (ref 5–40)

## 2019-11-18 LAB — PSA: Prostate Specific Ag, Serum: 0.4 ng/mL (ref 0.0–4.0)

## 2019-11-18 LAB — URIC ACID: Uric Acid: 5.9 mg/dL (ref 3.8–8.4)

## 2019-11-18 NOTE — Telephone Encounter (Signed)
Received fax from pharmacy, PA needed on hydrocodone.  Clinical questions submitted via Cover My Meds.  Waiting on response, could take up to 72 hours.  Cover My Meds info: Key: [place], CMA

## 2019-11-19 MED ORDER — HYDROCODONE-ACETAMINOPHEN 5-325 MG PO TABS: 1.0000 | ORAL_TABLET | Freq: Two times a day (BID) | ORAL | 0 refills | Status: DC | PRN

## 2019-11-19 NOTE — Addendum Note (Signed)
Addended by: Dorna Bloom on: 11/19/2019 11:41 AM   Modules accepted: Orders

## 2019-11-19 NOTE — Telephone Encounter (Signed)
PMP and Dr. Maudie Flakes note reviewed. Rx to pharmacy.   Dorris Singh, MD  Family Medicine Teaching Service

## 2019-11-19 NOTE — Addendum Note (Signed)
Addended by: Owens Shark, Norvin Ohlin on: 11/19/2019 12:13 PM   Modules accepted: Orders

## 2019-11-19 NOTE — Telephone Encounter (Signed)
Pharmacy calls nurse line stating the medication is not being approved due to a DEA issue with [place] and controlled substances. Medication needs to be sent in by an attending. A verbal DEA can not be given. Will forward to an attending.

## 2019-11-22 ENCOUNTER — Encounter: Payer: Self-pay | Admitting: Family Medicine

## 2019-11-22 NOTE — Telephone Encounter (Signed)
I spoke with the pharmacist regarding Corey Watts hydrocodone-acetaminophen.  She says that the quantity prescribed will need a prior authorization, but she was able to fill a 7-day supply for now to give [place] time to work on the [place].  I called Corey Watts to inform him of this.

## 2019-11-23 NOTE — Telephone Encounter (Signed)
Sorry, when I spoke with the pharmacy they stated it would be covered if sent in by an attending. Will start working on PA now.

## 2019-11-24 ENCOUNTER — Other Ambulatory Visit: Payer: Self-pay | Admitting: Family Medicine

## 2019-11-24 MED ORDER — HYDROCODONE-ACETAMINOPHEN 5-325 MG PO TABS: 1.0000 | ORAL_TABLET | Freq: Two times a day (BID) | ORAL | 0 refills | Status: DC | PRN

## 2019-11-24 NOTE — Telephone Encounter (Signed)
Okay thank you, I will order his medication now.

## 2019-11-24 NOTE — Telephone Encounter (Signed)
Called pharmacy and they stated he came in this am with an approval letter from his insurance stating they will cover the medication.

## 2019-11-26 ENCOUNTER — Other Ambulatory Visit: Payer: Self-pay | Admitting: *Deleted

## 2019-11-26 MED ORDER — IBUPROFEN 800 MG PO TABS: 800.0000 mg | ORAL_TABLET | Freq: Three times a day (TID) | ORAL | 0 refills | Status: DC | PRN

## 2019-12-01 ENCOUNTER — Telehealth: Payer: Self-pay

## 2019-12-01 NOTE — Telephone Encounter (Signed)
I called [facility] for more information about patient's hydrocodone-acetaminophen since he has still been unable to receive the prescription after I have sent it and after my attending has sent it.  They told me that there is a block on the medication from the insurance company saying that a prior authorization is still needed.  [facility] pharmacy then called [employer], who told them that there is still a prior authorization needed for the prescription.  Our nurse clinic called the number for the prior authorization, and she was told that the medication had been approved through April 3.  The patient also has a letter of approval from his insurance company that he brought to the clinic today.  The [place] controlled substances database has been interrogated multiple times, and patient's last prescription of a controlled substance was filled in May 2020, and there are no red flags.  I am unsure what the issue is about this prescription, but we will continue to work on this issue until it is resolved.

## 2019-12-01 NOTE — Telephone Encounter (Signed)
LVM for pt to call the office. If he calls, please let him know DR. Winfrey spoke to [employer]. The medication is still being refused by insurance company. Pharmacy is going to call insurance company to find out what the issue is and why they haven't released the authorization. [name], CMA

## 2019-12-02 NOTE — Telephone Encounter (Signed)
Called and spoke with [name] @ [facility] yesterday and she was going to call the insurance company and once again try to figure out the issue.  Corey Watts calls today and is very appreciative for all the hard work we did.  He states that the pharmacy was putting in the wrong code.  Will forward to PCP for FYI. Christen Bame, CMA

## 2019-12-09 ENCOUNTER — Other Ambulatory Visit: Payer: Self-pay

## 2019-12-09 ENCOUNTER — Encounter: Payer: Self-pay | Admitting: Family Medicine

## 2019-12-09 ENCOUNTER — Ambulatory Visit (INDEPENDENT_AMBULATORY_CARE_PROVIDER_SITE_OTHER): Payer: Medicare Other | Admitting: Family Medicine

## 2019-12-09 VITALS — BP 140/80 | HR 76 | Wt 211.8 lb

## 2019-12-09 DIAGNOSIS — R229 Localized swelling, mass and lump, unspecified: Secondary | ICD-10-CM | POA: Diagnosis not present

## 2019-12-09 DIAGNOSIS — C44319 Basal cell carcinoma of skin of other parts of face: Secondary | ICD-10-CM | POA: Diagnosis not present

## 2019-12-09 NOTE — Patient Instructions (Signed)
Skin Biopsy, Care After This sheet gives you information about how to care for yourself after your procedure. Your health care provider may also give you more specific instructions. If you have problems or questions, contact your health care provider. What can I expect after the procedure? After the procedure, it is common to have:  Soreness.  Bruising.  Itching. Follow these instructions at home: Biopsy site care Follow instructions from your health care provider about how to take care of your biopsy site. Make sure you:  Wash your hands with soap and water before and after you change your bandage (dressing). If soap and water are not available, use hand sanitizer.  Apply ointment on your biopsy site as directed by your health care provider.  Change your dressing as told by your health care provider.  Leave stitches (sutures), skin glue, or adhesive strips in place. These skin closures may need to stay in place for 2 weeks or longer. If adhesive strip edges start to loosen and curl up, you may trim the loose edges. Do not remove adhesive strips completely unless your health care provider tells you to do that.  If the biopsy area bleeds, apply gentle pressure for 10 minutes. Check your biopsy site every day for signs of infection. Check for:  Redness, swelling, or pain.  Fluid or blood.  Warmth.  Pus or a bad smell.  General instructions  Rest and then return to your normal activities as told by your health care provider.  Take over-the-counter and prescription medicines only as told by your health care provider.  Keep all follow-up visits as told by your health care provider. This is important. Contact a health care provider if:  You have redness, swelling, or pain around your biopsy site.  You have fluid or blood coming from your biopsy site.  Your biopsy site feels warm to the touch.  You have pus or a bad smell coming from your biopsy site.  You have a  fever.  Your sutures, skin glue, or adhesive strips loosen or come off sooner than expected. Get help right away if:  You have bleeding that does not stop with pressure or a dressing. Summary  After the procedure, it is common to have soreness, bruising, and itching at the site.  Follow instructions from your health care provider about how to take care of your biopsy site.  Check your biopsy site every day for signs of infection.  Contact a health care provider if you have redness, swelling, or pain around your biopsy site, or your biopsy site feels warm to the touch.  Keep all follow-up visits as told by your health care provider. This is important. This information is not intended to replace advice given to you by your health care provider. Make sure you discuss any questions you have with your health care provider. Document Revised: 03/02/2018 Document Reviewed: 03/02/2018 Elsevier Patient Education  2020 Elsevier Inc.  

## 2019-12-09 NOTE — Assessment & Plan Note (Signed)
Patient with area of skin nodule on the face.  Some telangiectasia as well.  Rolled borders as well.  Shave biopsy performed.  We will send sample for pathology.  Strict return precautions given.  Hemostasis achieved prior to leaving.  Advised to use sunscreen when going outdoors.  Follow-up if no improvement.  Infection precautions discussed.  Bleeding precautions discussed.

## 2019-12-09 NOTE — Progress Notes (Signed)
    SUBJECTIVE:   CHIEF COMPLAINT / HPI:   Skin lesion Patient is in for follow-up of skin lesion per PCP recommendations.  States the lesion has been present for 1 year on his left cheek.  Denies any pain or itching of the area.  Denies any irritation.  Becomes red every once in a while.  Denies change in size or shape.  States he only noticed it when he cut himself shaving. 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  Also spends significant time outside at the beach.  Does not use any sunscreen.  No family or personal history of skin cancer.  No use of blood thinners other than daily 81 mg aspirin.  PERTINENT  PMH / PSH: HTN  OBJECTIVE:   BP 140/80   Pulse 76   Wt 211 lb 12.8 oz (96.1 kg)   SpO2 96%   BMI 28.73 kg/m   Skin: 0.7cmx0.8cm   Shave biopsy Shave biopsy of left cheek. Both verbal and written informed consent obtained prior to procedure. Skin shave performed in clinic using semi-sterile technique. Area was initially prepped with alcohol swab prior to lidocaine injection. Needle with lidocaine was inserted bilaterally and aspirated to ensure no blood was drawn back. 1cc 1% Lidocaine with epi was injected to raise a wheal. Area was draped to ensure sterile field and prepped with betadine prior to procedure. Prior to removal with shave razor blade, area was palpated to ensure adequate anesthesia. Lesion was removed and placed in labeled container for pathology. Gauze applied with pressure and drysol was used to achieve hemastasis. Area was cleaned and dressed.  Patient tolerated procedure well with negligible blood loss. Removed lesion was sent for pathology  Advised patient to look for signs of bleeding or infection and to return if problems arise.   ASSESSMENT/PLAN:   Skin nodule Patient with area of skin nodule on the face.  Some telangiectasia as well.  Rolled borders as well.  Shave biopsy performed.  We will send sample for pathology.   Strict return precautions given.  Hemostasis achieved prior to leaving.  Advised to use sunscreen when going outdoors.  Follow-up if no improvement.  Infection precautions discussed.  Bleeding precautions discussed.     Caroline More, [place]

## 2019-12-13 ENCOUNTER — Telehealth: Payer: Self-pay | Admitting: Family Medicine

## 2019-12-13 NOTE — Telephone Encounter (Signed)
Corey Watts,   Please advise this patient before his 5/13 appointment that we might eventually need to refer him out to derm, given his lesion's location. (Face/Cheek).  Upon record review, he already had a shave biopsy. Since the lesion extended to the margin, he will need excision, which I will like to avoid in that location.  Please advise him regarding the possibility that he might be referred to Dermatology or a maxillofacial surgeon.  To save him an extra visit, I think Sherin should go ahead with a specialist referral unless you want to schedule him on the day Truman Hayward will be in the [place] clinic

## 2019-12-13 NOTE — Telephone Encounter (Signed)
Pt called back and chose to change appt to 01/27/2020.  They had a conflict on [phone]. Christen Bame, CMA

## 2019-12-13 NOTE — Telephone Encounter (Signed)
Path results showing basal cell carcinoma, nodular pattern.  The lesion extends to the deep margin of the specimen.  I discussed this with patient and explained to him what basal cell carcinoma was.  Advised to follow-up in either dermatology clinic or with a dermatologist for the excisional removal.  He is agreeable with this plan and prefers dermatology clinic.  Next dermatology clinic appointment available on 01/20/20.  Dr. Erin Hearing is also faculty during that appointment time and he has seen patient previously.  I discussed this with Dr. Erin Hearing as well who agrees with above plan.  Appointment time selected per patient preference.  Strict return precautions given.  Advised to please call with any questions and I would be happy to answer them.  Dalphine Handing, PGY-3 [facility] 12/13/2019 12:20 PM

## 2019-12-14 NOTE — Telephone Encounter (Signed)
Called patient to discuss. He would prefer for procedure to be done in derm clinic rather than referral to dermatologist.   I discussed with Dr. Erin Hearing who informed me he would likely be able to perform excision in derm clinic. Patient is willing to switch back to 01/20/20 if this means not going to a dermatologist.   I have switched patient's appointment back to 5/6 @2 :50PM when Dr. Erin Hearing will be preceptor for derm clinic.   Dalphine Handing, PGY-3 [facility] 12/14/2019 5:08 PM

## 2019-12-27 ENCOUNTER — Ambulatory Visit (INDEPENDENT_AMBULATORY_CARE_PROVIDER_SITE_OTHER): Payer: Medicare Other | Admitting: Family Medicine

## 2019-12-27 ENCOUNTER — Encounter: Payer: Self-pay | Admitting: Family Medicine

## 2019-12-27 ENCOUNTER — Other Ambulatory Visit: Payer: Self-pay

## 2019-12-27 VITALS — BP 120/70 | HR 82 | Wt 212.5 lb

## 2019-12-27 DIAGNOSIS — M5386 Other specified dorsopathies, lumbar region: Secondary | ICD-10-CM | POA: Diagnosis not present

## 2019-12-27 DIAGNOSIS — G8929 Other chronic pain: Secondary | ICD-10-CM

## 2019-12-27 DIAGNOSIS — M5441 Lumbago with sciatica, right side: Secondary | ICD-10-CM

## 2019-12-27 DIAGNOSIS — G6289 Other specified polyneuropathies: Secondary | ICD-10-CM | POA: Diagnosis not present

## 2019-12-27 MED ORDER — GABAPENTIN 300 MG PO CAPS: 300.0000 mg | ORAL_CAPSULE | Freq: Three times a day (TID) | ORAL | 11 refills | Status: DC

## 2019-12-27 NOTE — Patient Instructions (Signed)
It was a pleasure to see you today! I am so sorry for your pain.  1. I have referred you to orthopedic surgery and physical therapy they will call you to schedule appointments  2. Increase gabapentin to 300 mg TID, if this does not work, call our office and we can dose adjust to get better control. Continue taking your other medications as well and I recommend adding ice in too  3. We will get xrays of your back today, if anything is abnormal I will give you a call. We will give you a sheet to go to the correct office, you can go get x-rays between the hours of 8 and 5 any day M-F.  Be Well!  Dr. Chauncey Reading

## 2019-12-27 NOTE — Progress Notes (Signed)
    SUBJECTIVE:   CHIEF COMPLAINT / HPI: hip/leg pain  Mr. Tien is a 64 yo man with h/o of lumbar surgery/discectomy in 1989, 1990. He presents today with acute worsening of back/hip/leg pain that has become more severe in the last 2 weeks. Symptoms include tingling/sharp pain that shoots from hip to foot on R leg. He has pain in all positions, difficulty walking, lifting leg. Pain is particularly bothersome at night and prevents patient from sleeping or getting comfortable in any position. He has tried OTC analgesics, vicodin, flexeril, ice, heat, rest, and home exercises without relief. He also uses gabapentin 300 mg BID.   PERTINENT  PMH / PSH: sciatica, s/p lumbar discectomies, peripheral neuropathy  OBJECTIVE:   BP 120/70   Pulse 82   Wt 212 lb 8 oz (96.4 kg)   SpO2 96%   BMI 28.82 kg/m   Physical Exam Vitals and nursing note reviewed.  Constitutional:      General: He is not in acute distress.    Appearance: Normal appearance. He is obese. He is not ill-appearing or toxic-appearing.     Comments: In discomfort   HENT:     Head: Normocephalic and atraumatic.  Musculoskeletal:        General: No swelling, tenderness or deformity.     Right lower leg: No edema.     Left lower leg: Edema present.     Comments: Lumbar spine: tenderness to palpation on midline at approx L5-S1, no paraspinal tenderness to palpation bilaterally, limited ROM due to pain RLE: ROM limited to 15* by pain, no pain with FADIR FABER but limited rotation; no tenderness to palpation of greater trochanter; no ecchymosis, edema or erythema; SLR (+) on ipsilateral side, shooting pain down to foot LLE: ROM intact, no pain with FADIR FABER, limited rotation due to stiffness; no tenderness to palpation of hip joint, no ecchymosis, edema or erythema; SLR (-) on contralateral side, can reach 90* without reproducing pain  Neurological:     General: No focal deficit present.     Mental Status: He is alert and  oriented to person, place, and time. Mental status is at baseline.     Sensory: No sensory deficit.     Motor: No weakness.     Coordination: Coordination normal.     Gait: Gait abnormal.     Deep Tendon Reflexes: Reflexes normal.  Psychiatric:        Mood and Affect: Mood normal.        Behavior: Behavior normal.    ASSESSMENT/PLAN:  Johnathyn Cassone is a 64 yo man w/ h/o lumbar discectomies, peripheral neuropathy, and sciatica  Chronic low back pain with right-sided sciatica Given neuropathic pain description with long history of lumbar surgery for similar problem, positive SLR test, and failure of conservative measures, most likely diagnosis is sciatica. Discussed multifactorial approach to severe back pain including current regimen of heat/stretching/flexeril/NSAIDs/Vicodin as well as referral to orthopedic surgery for consultation and to physical therapy for more intense and directed exercises. Due to midline pain with palpation of lumbar spine, will obtain xrays to r/o fracture. Cr function WNL, plan to increase gabapentin to TID, currently only using 600mg  daily, more room to titrate up as needed to help with neuropathic pain. - Referral to orthopedic surgery - Referral to physical therapy - Xray of complete lumbar spine - Increase gabapentin to 300 mg TID, can titrate further if needed   Gladys Damme, MD [place]

## 2019-12-28 ENCOUNTER — Encounter: Payer: Self-pay | Admitting: Family Medicine

## 2019-12-28 NOTE — Assessment & Plan Note (Signed)
Given neuropathic pain description with long history of lumbar surgery for similar problem, positive SLR test, and failure of conservative measures, most likely diagnosis is sciatica. Discussed multifactorial approach to severe back pain including current regimen of heat/stretching/flexeril/NSAIDs/Vicodin as well as referral to orthopedic surgery for consultation and to physical therapy for more intense and directed exercises. Due to midline pain with palpation of lumbar spine, will obtain xrays to r/o fracture. Cr function WNL, plan to increase gabapentin to TID, currently only using 600mg  daily, more room to titrate up as needed to help with neuropathic pain. - Referral to orthopedic surgery - Referral to physical therapy - Xray of complete lumbar spine - Increase gabapentin to 300 mg TID, can titrate further if needed

## 2019-12-29 ENCOUNTER — Ambulatory Visit
Admission: RE | Admit: 2019-12-29 | Discharge: 2019-12-29 | Disposition: A | Discharge status: Home / Self Care | Payer: Medicare Other | Source: Ambulatory Visit | Attending: Family Medicine | Admitting: Family Medicine

## 2019-12-29 ENCOUNTER — Other Ambulatory Visit: Payer: Self-pay

## 2019-12-29 ENCOUNTER — Telehealth: Payer: Self-pay

## 2019-12-29 DIAGNOSIS — M545 Low back pain: Secondary | ICD-10-CM | POA: Diagnosis not present

## 2019-12-29 DIAGNOSIS — M5386 Other specified dorsopathies, lumbar region: Secondary | ICD-10-CM

## 2019-12-29 MED ORDER — CYCLOBENZAPRINE HCL 10 MG PO TABS: 10.0000 mg | ORAL_TABLET | Freq: Three times a day (TID) | ORAL | 0 refills | Status: DC | PRN

## 2019-12-29 NOTE — Progress Notes (Signed)
Good news, no fracture. Because his pain is worse and he did all conservative measures, I referred him to ortho and PT.

## 2019-12-29 NOTE — Telephone Encounter (Signed)
Corey Manns, NP with [facility] calls nurse line to report A1C level during house visit. Reports hgb A1C of 6.0 (pre-diabetic range).   To PCP  Please advise  Talbot Grumbling, RN

## 2020-01-03 ENCOUNTER — Telehealth: Payer: Self-pay

## 2020-01-03 NOTE — Telephone Encounter (Signed)
Pharmacist calls nurse line stating that refill on hydrocodone-acetaminophen needs to be sent in by attending provider. Last sent in on 3/5 by Dr. Owens Shark.   Resending refill request to Dr. Owens Shark.   FYI to PCP and Dr. Owens Shark.   Talbot Grumbling, RN

## 2020-01-04 MED ORDER — HYDROCODONE-ACETAMINOPHEN 5-325 MG PO TABS: 1.0000 | ORAL_TABLET | Freq: Two times a day (BID) | ORAL | 0 refills | Status: DC | PRN

## 2020-01-04 NOTE — Telephone Encounter (Signed)
PMP reviewed and Rx signed.   Dorris Singh, MD  Family Medicine Teaching Service

## 2020-01-04 NOTE — Telephone Encounter (Signed)
Mr. Corey Watts usually uses a total of 180 pills/year.  Dr. Mingo Amber sent in 3 prescriptions at the same time in 2020, but since it was very difficult for insurance to approve the last prescription, I did not want to send 3 at the same time this year.  He plans to make sure that 180 pills last him throughout the year again, so it is appropriate to refill the prescription.  Thank you for your help with this and let me know if you have any questions.

## 2020-01-07 NOTE — Telephone Encounter (Signed)
Called Corey Watts and Corey Watts and informed them of their A1c results.  I explained what this result means and the dietary changes they can make to help reduce the risk of progression to type 2 diabetes I also discussed the possibility of adding Metformin to prevent progression as well if they are interested.  She says that they will discuss and think about this and let me know if they are interested.

## 2020-01-20 ENCOUNTER — Other Ambulatory Visit: Payer: Self-pay

## 2020-01-20 ENCOUNTER — Ambulatory Visit (INDEPENDENT_AMBULATORY_CARE_PROVIDER_SITE_OTHER): Payer: Medicare Other | Admitting: Family Medicine

## 2020-01-20 ENCOUNTER — Ambulatory Visit: Payer: Medicare Other

## 2020-01-20 VITALS — BP 127/80 | HR 88 | Ht 72.0 in | Wt 211.4 lb

## 2020-01-20 DIAGNOSIS — L988 Other specified disorders of the skin and subcutaneous tissue: Secondary | ICD-10-CM | POA: Diagnosis not present

## 2020-01-20 DIAGNOSIS — C44319 Basal cell carcinoma of skin of other parts of face: Secondary | ICD-10-CM | POA: Diagnosis not present

## 2020-01-20 DIAGNOSIS — L989 Disorder of the skin and subcutaneous tissue, unspecified: Secondary | ICD-10-CM | POA: Insufficient documentation

## 2020-01-20 DIAGNOSIS — C44310 Basal cell carcinoma of skin of unspecified parts of face: Secondary | ICD-10-CM | POA: Insufficient documentation

## 2020-01-20 DIAGNOSIS — C4491 Basal cell carcinoma of skin, unspecified: Secondary | ICD-10-CM | POA: Insufficient documentation

## 2020-01-20 NOTE — Patient Instructions (Addendum)
Thank you for coming to see me today. It was a pleasure! Today we talked about:   We scraped the area of basal cell carcinoma off of your cheek today and then used electronic desiccation therapy to cauterize the wound. You should not need any follow up unless you have any issues with redness, fevers, the area not healing or you notice any new areas arise. Please be sure to use plenty of sunscreen >30SPF on the area and on areas exposed to sun.   Please follow-up as needed.  If you have any questions or concerns, please do not hesitate to call the office at [phone].  Take Care!  Basal Cell Carcinoma Basal cell carcinoma is the most common form of skin cancer. It begins in the basal cells, which are at the bottom of the outer skin layer (epidermis). Basal cell carcinoma can almost always be cured. It rarely spreads to other areas of the body (metastasizes). It may come back at the same location (recur), but it can be treated again if this happens. Basal cell carcinoma occurs most often on parts of the body that are frequently exposed to the sun, such as:  Parts of the head, including the scalp or face.  Ears.  Neck.  Arms or legs.  Backs of the hands. What are the causes? This condition is usually caused by exposure to ultraviolet (UV) light. UV light may come from the sun or from tanning beds. Other causes include:  Exposure to a highly poisonous metal (arsenic).  Exposure to high-energy X-rays (radiation).  Exposure to toxic tars and oils.  Certain genetic conditions, such as a condition that makes a person sensitive to sunlight (xeroderma pigmentosum). What increases the risk? You are more likely to develop this condition if:  You are older than 64 years of age.  You have: ? Fair skin (light complexion). ? Blond or red hair. ? Blue, green, or gray eyes. ? Childhood freckling. ? Had sun exposure over long periods of time, especially during childhood. ? Had repeated  sunburns. ? A weakened immune system. ? Been exposed to certain chemicals, such as tar, soot, and arsenic. ? Chronic inflammatory conditions. ? Chronic infections.  You use tanning beds. What are the signs or symptoms? The main symptom of this condition is a growth or lesion on the skin.  The shape and color of the growth or lesion may vary. The main types include: ? An open sore that may remain open for 3 weeks or longer. The sore may bleed or crust. This type of lesion can be an early sign of basal cell carcinoma. Basal cell carcinoma often shows up as a sore that does not heal. ? A reddish area that may crust, itch, or cause discomfort. This may occur on areas that are exposed to the sun. These patches might be easier to feel than to see. ? A shiny or clear bump that is red, white, or pink. In people who have dark hair, the bump is often tan, black, or brown. These bumps can look like moles. ? A pink growth with a raised border. The growth will have a crusted and indented area in the center. Small blood vessels may appear on the surface of the growth as it gets bigger. ? A scar-like area that looks like shiny, stretched skin. The area may be white, yellow, or waxy. It often has irregular borders. This may be a sign of more aggressive basal cell carcinoma. How is this diagnosed? This  condition may be diagnosed with:  A physical exam.  Removal of a tissue sample to be examined under a microscope (biopsy). How is this treated? Treatment for this condition involves removing the cancerous tissue. The method that is used for this depends on the type, size, location, and number of tumors. Possible treatments include:  Mohs surgery. In this procedure, the cancerous skin cells are removed layer by layer until all of the tumor has been removed.  Surgical removal (excision) of the tumor. This involves removing the entire tumor and a small amount of normal skin that surrounds it.  Cryosurgery.  This involves freezing the tumor with liquid nitrogen.  Plastic surgery. The tumor is removed, and healthy skin from another part of the body is used to cover the wound. This may be done for large tumors that are in areas where it is not possible to stretch the nearby skin to sew the edges of the wound together.  Radiation. This may be used for tumors on the face.  Photodynamic therapy. A chemical cream is applied to the skin, and light exposure is used to activate the chemical.  Electrodesiccation and curettage. This involves alternately scraping and burning the tumor while using an electric current to control bleeding.  Chemical treatments, such as imiquimod cream and interferon injections. These may be used to remove superficial tumors with minimal scarring. Follow these instructions at home:  Avoid direct exposure to the sun.  Do self-exams as told by your health care provider. Look for new spots or changes in your skin.  Keep all follow-up visits as told by your health care provider. This is important. How is this prevented?   Avoid the sun when it is the strongest. This is usually between 10 a.m. and 4 p.m.  When you are out in the sun, use a sunscreen that has a sun protection factor (SPF) of at least 20.  Apply sunscreen at least 30 minutes before exposure to the sun.  Reapply sunscreen every 2-4 hours while you are outside. Also reapply it after swimming and after excessive sweating.  Always wear hats, protective clothing, and UV-blocking sunglasses when you are outdoors.  Do not use tanning beds. Contact a health care provider if you:  Notice any new spots or any changes in your skin.  Have had a basal cell carcinoma tumor removed, and you notice a new growth in the same location. Get help right away if you have a spot that:  Is sore and does not heal.  Bleeds easily with minor injury. Summary  Basal cell carcinoma is the most common form of skin cancer. It  begins in the bottom of the outer skin layer (epidermis). Basal cell carcinoma can almost always be cured.  This condition is usually caused by exposure to ultraviolet (UV) light. It mostly affects the face, scalp, neck, ears, arms, legs, or backs of the hands.  The main symptom of this condition is a growth or lesion on the skin that can vary in shape and color.  You can prevent this cancer by avoiding direct exposure to the sun, applying sunscreen of at least 30 SPF, and wearing protective clothing.  Apply sunscreen 30 minutes before you go out into the sun, and reapply every 2-4 hours while you are outside. This information is not intended to replace advice given to you by your health care provider. Make sure you discuss any questions you have with your health care provider. Document Revised: 01/21/2018 Document Reviewed: 01/21/2018 Elsevier Patient  Education  2020 [employer]  

## 2020-01-20 NOTE — Assessment & Plan Note (Addendum)
Electrodesiccation and Curettage Procedure Note  PRE-OP DIAGNOSIS: Basal Cell Carcinoma  POST-OP DIAGNOSIS: Basal Cell Carcinoma  PROCEDURE: Electro desiccation and curettage of the left cheek  Performing Physician: Gerlene Fee, DO  Procedure: Electro desiccation and curettage of the left cheek  The lesion was prepared with alcohol and topical Betadine. Anesthesia was performed by raising a wheal of anesthetic with a small needle with 1% lidocaine plus epinephrine to assist with hemostasis. A dermal curette (size 3 mm) was used. With the sharp side of the curette touching the skin, the lesion was scraped to its base, where a "gritty" feeling of the dermis was obtained. Electrodessication was then applied to the area and blotted to a dry base with gauze. Hyfrecator settings varied for achieving adequate heat-induced coagulation of bleeding. The above last 3 steps were repeated x3 in various directions in a checkerboard pattern with firm counterpressure and until all the tissue felt firm and gritty.  The patient tolerated the procedure well. Wound was covered with triple antibiotic cream and bandage. Patient given return precautions and follow up instructions.

## 2020-01-20 NOTE — Assessment & Plan Note (Signed)
Cryotherapyof Benign Skin Lesion Procedure Note   PRE-OP DIAGNOSIS:Left posterior shoulder, precancerous lesion  POST-OP DIAGNOSIS: Same   PROCEDURE:Cryotherapy of alesion   Performing Physician:Jordan Magnus Ivan, MD   PROCEDURE:  Cryotherapy   The area surrounding the skin lesion was prepared in the usual sterile manner. A test freeze was performed ensuring coverage of entire area as above. The cryotherapy gun was then applied for 3seconds until an ice ball formed with a 5-7 mm border. This was allowed to thaw and then the cryotherapy was again applied for 3seconds to an ice ball of 5-7 mm.   The patient tolerated the procedure well. Return precautions provided. Return to the office if area on nose does not heal completely as he may require biopsy.

## 2020-01-20 NOTE — Progress Notes (Addendum)
    SUBJECTIVE:   CHIEF COMPLAINT / HPI:   Basal Cell carcinoma Here for follow up. S/p shave biopsy 12/09/19. No concerns today.   Skin lesion Right shoulder lesion. Patient would like to try cryotherapy.   PERTINENT  PMH / PSH: Skin nodules, lateral chest wall cyst  OBJECTIVE:   BP 127/80   Pulse 88   Ht 6' (1.829 m)   Wt 211 lb 6.4 oz (95.9 kg)   SpO2 96%   BMI 28.67 kg/m   Skin: Right cheek lesion with well defined borders. Right shoulder pre-cancerous lesion. Left lateral cheek wall non-draining.  ASSESSMENT/PLAN:   Basal cell carcinoma Electrodesiccation and Curettage Procedure Note  PRE-OP DIAGNOSIS: Basal Cell Carcinoma  POST-OP DIAGNOSIS: Basal Cell Carcinoma  PROCEDURE: Electro desiccation and curettage of the left cheek  Performing Physician: Gerlene Fee, DO  Procedure: Electro desiccation and curettage of the left cheek  The lesion was prepared with alcohol and topical Betadine. Anesthesia was performed by raising a wheal of anesthetic with a small needle with 1% lidocaine plus epinephrine to assist with hemostasis. A dermal curette (size 3 mm) was used. With the sharp side of the curette touching the skin, the lesion was scraped to its base, where a "gritty" feeling of the dermis was obtained. Electrodessication was then applied to the area and blotted to a dry base with gauze. Hyfrecator settings varied for achieving adequate heat-induced coagulation of bleeding. The above last 3 steps were repeated x3 in various directions in a checkerboard pattern with firm counterpressure and until all the tissue felt firm and gritty.  The patient tolerated the procedure well. Wound was covered with triple antibiotic cream and bandage. Patient given return precautions and follow up instructions.   Skin lesion of right upper extremity Cryotherapyof Benign Skin Lesion Procedure Note   PRE-OP DIAGNOSIS:Left posterior shoulder, precancerous lesion  POST-OP  DIAGNOSIS: Same   PROCEDURE:Cryotherapy of alesion   Performing Physician:Jordan Magnus Ivan, MD   PROCEDURE:  Cryotherapy   The area surrounding the skin lesion was prepared in the usual sterile manner. A test freeze was performed ensuring coverage of entire area as above. The cryotherapy gun was then applied for 3seconds until an ice ball formed with a 5-7 mm border. This was allowed to thaw and then the cryotherapy was again applied for 3seconds to an ice ball of 5-7 mm.   The patient tolerated the procedure well. Return precautions provided. Return to the office if area on nose does not heal completely as he may require biopsy.    Gerlene Fee, [place]    I was present during procedure  Lind Covert

## 2020-01-21 ENCOUNTER — Telehealth: Payer: Self-pay | Admitting: Family Medicine

## 2020-01-21 NOTE — Telephone Encounter (Signed)
Phone patient. No answer. LVM. Called to check in on patient after yesterdays procedure. If all is well, we will see him at his next follow up appointment.  Dr. Janus Molder, DO

## 2020-01-27 ENCOUNTER — Ambulatory Visit: Payer: Medicare Other

## 2020-03-02 ENCOUNTER — Other Ambulatory Visit: Payer: Self-pay | Admitting: Family Medicine

## 2020-03-21 ENCOUNTER — Ambulatory Visit: Payer: Medicare Other

## 2020-03-21 NOTE — Patient Instructions (Addendum)
Thank you for coming to see me today. It was a pleasure. Today we talked about:   Right foot pain  Increase Gabapentin to three times a day until you see your PCP I have ordered an xray of your foot, will call you with results if abnormal otherwise can talk about it at PCP visit Prescription sent for Diclofenac gel, apply 4 times a day  Continue to use good supportive shoes  Please follow-up with PCP in 1 week  If you have any questions or concerns, please do not hesitate to call the office at [phone].  Best,   Carollee Leitz, MD [facility]   Peroneal Tendinopathy  Peroneal tendinopathy is irritation of the tendons that pass behind your ankle (peroneal tendons). These tendons attach muscles in your foot to a bone on the side of your foot and underneath the arch of your foot. This condition can cause your peroneal tendons to get bigger and swell. What are the causes? This condition may be caused by:  Putting stress on your ankle over and over again (overuse injury).  A sudden injury that puts stress on your tendons, such as an ankle sprain. What increases the risk? You are more likely to develop this condition if you:  Have high arches.  Play sports that involve putting stress on the ankle over and over again. These sports include: ? Running. ? Dancing. ? Soccer. ? Basketball. What are the signs or symptoms? Symptoms of this condition can start suddenly or develop gradually. Symptoms of this condition include:  Pain in the back of the ankle, on the side of the foot, or in the arch of the foot.  Pain that gets worse with activity and better with rest.  Swelling.  Warmth.  Weakness in your foot or ankle. How is this diagnosed? This condition may be diagnosed based on:  Your symptoms.  Your medical history.  A physical exam. During the exam, your health care provider may move your foot and ankle and test the strength of your leg  muscles.  Imaging tests, such as: ? X-rays or a CT scan to check for bone injury. ? MRI or ultrasound to check for muscle or tendon injury. How is this treated? This condition may be treated by:  Keeping your body weight off your ankle for several days.  Returning to full activity gradually.  Putting ice on your ankle to reduce swelling.  Taking NSAIDs, such as ibuprofen.  Having medicine injected into your tendon to reduce swelling.  Wearing a removable boot or brace for ankle support.  Doing range-of-motion exercises and strengthening exercises (physical therapy) when pain and swelling improve. If the condition does not improve with treatment, or if a tendon or muscle is damaged, surgery may be needed. Follow these instructions at home: If you have a boot or brace:  Wear the boot or brace as told by your health care provider. Remove it only as told by your health care provider.  Loosen the boot or brace if your toes tingle, become numb, or turn cold and blue.  Keep the boot or brace clean.  If the boot or brace is not waterproof: ? Do not let it get wet. ? Cover it with a watertight covering when you take a bath or shower. Managing pain, stiffness, and swelling   If directed, put ice on the injured area. ? If you have a removable boot or brace, remove it as told by your health care provider. ? Put  ice in a plastic bag. ? Place a towel between your skin and the bag. ? Leave the ice on for 20 minutes, 2-3 times a day.  Move your toes often to reduce stiffness and swelling.  Raise (elevate) your ankle above the level of your heart while you are sitting or lying down. Activity  Do not do activities that make pain or swelling worse.  Do exercises as told by your health care provider.  Return to your normal activities as told by your health care provider. Ask your health care provider what activities are safe for you.  Ask your health care provider when it is safe to  drive if you have a boot or brace on your foot. General instructions  Take over-the-counter and prescription medicines only as told by your health care provider.  Do not use any products that contain nicotine or tobacco, such as cigarettes, e-cigarettes, and chewing tobacco. These can delay healing. If you need help quitting, ask your health care provider.  Keep all follow-up visits as told by your health care provider. This is important. How is this prevented?  Wear supportive footwear that is appropriate for your athletic activity.  Avoid athletic activities that cause swelling or pain in your ankle or foot.  See your health care provider if you have pain or swelling that does not improve after a few days of rest.  Stop training if you develop pain or swelling.  If you start a new athletic activity, start gradually to build up your strength, endurance, and flexibility.  Warm up and stretch before being active.  Cool down and stretch after being active. Contact a health care provider if:  Your symptoms get worse.  Your symptoms do not improve in 2-4 weeks.  You develop new, unexplained symptoms. Summary  Peroneal tendinopathy is irritation of the tendons that pass behind your ankle.  This condition is caused by overuse or sudden injury to the peroneal tendon.  Symptoms include pain, swelling, warmth, and weakness in your foot or ankle.  This condition is treated with rest, ice, medicines, physical therapy, and surgery if needed. This information is not intended to replace advice given to you by your health care provider. Make sure you discuss any questions you have with your health care provider. Document Revised: 12/24/2018 Document Reviewed: 10/12/2018 Elsevier Patient Education  [place].

## 2020-03-21 NOTE — Progress Notes (Signed)
    SUBJECTIVE:   CHIEF COMPLAINT / HPI: Right foot pain  Foot pain Ongoing for about 4-5 months.   Intermittent sharp pain but mostly constant aches.  Worse when standing for long periods.  Relieved when sitting. Ibuprofen and Vicodin helps relieve pain a little. Denies any swelling or recent trauma.    PERTINENT  PMH / PSH:  Peripheral neuropathy Gout Chronic low back pain   OBJECTIVE:   BP 118/78   Pulse 72   Ht 6' (1.829 m)   Wt 210 lb 9.6 oz (95.5 kg)   SpO2 97%   BMI 28.56 kg/m    Right foot:  ROM wnl,  Eversion limited secondary to pain.  No edema, erythema or warmth.  Pulses present.  Sensory and motor intact, gait normal.  Mild tenderness to palpation over 2-5th metatarsal and along lateral aspect of foot.  No obvious lesions or deformity.  ASSESSMENT/PLAN:   Lower extremity tendinopathy Considered fracture, Mortons Neuroma, Osteoarthritis in differentials.  Likely pain secondary to chronic use of tendons with mild inflammation that may also be contributing factor. -Xray to rule out fracture given some pain over cuneiform  -Diclofenac gel qid -Increase gabapentin to 300 TID -Avoid constrictive shoes -Foot exercises as tolerated -Consider referral to Podiatry and Physiotherapy if symptoms continue     Carollee Leitz, MD [place]

## 2020-03-22 ENCOUNTER — Other Ambulatory Visit: Payer: Self-pay

## 2020-03-22 ENCOUNTER — Encounter: Payer: Self-pay | Admitting: Family Medicine

## 2020-03-22 ENCOUNTER — Ambulatory Visit
Admission: RE | Admit: 2020-03-22 | Discharge: 2020-03-22 | Disposition: A | Discharge status: Home / Self Care | Payer: Medicare Other | Source: Ambulatory Visit | Attending: Family Medicine | Admitting: Family Medicine

## 2020-03-22 ENCOUNTER — Ambulatory Visit (INDEPENDENT_AMBULATORY_CARE_PROVIDER_SITE_OTHER): Payer: Medicare Other | Admitting: Family Medicine

## 2020-03-22 VITALS — BP 118/78 | HR 72 | Ht 72.0 in | Wt 210.6 lb

## 2020-03-22 DIAGNOSIS — M6790 Unspecified disorder of synovium and tendon, unspecified site: Secondary | ICD-10-CM

## 2020-03-22 DIAGNOSIS — M79671 Pain in right foot: Secondary | ICD-10-CM | POA: Diagnosis not present

## 2020-03-22 DIAGNOSIS — M7731 Calcaneal spur, right foot: Secondary | ICD-10-CM | POA: Diagnosis not present

## 2020-03-22 MED ORDER — DICLOFENAC SODIUM 1 % EX GEL: 4.0000 g | Freq: Four times a day (QID) | CUTANEOUS | 1 refills | Status: DC

## 2020-03-23 ENCOUNTER — Ambulatory Visit (INDEPENDENT_AMBULATORY_CARE_PROVIDER_SITE_OTHER): Payer: Medicare Other | Admitting: Family Medicine

## 2020-03-23 VITALS — BP 127/80 | HR 63 | Ht 72.0 in | Wt 213.6 lb

## 2020-03-23 DIAGNOSIS — C44310 Basal cell carcinoma of skin of unspecified parts of face: Secondary | ICD-10-CM

## 2020-03-23 NOTE — Assessment & Plan Note (Addendum)
Well healed s/p electro-dissecation.  No new lesions.   - Advised patient to wear sunscreen when outdoors  - Advised patient to wear hat when cutting the grass or outside for an extended period of time  - Follow up for any new lesions or changes in current lesion

## 2020-03-23 NOTE — Progress Notes (Addendum)
° ° °  SUBJECTIVE:   CHIEF COMPLAINT / HPI:   Corey Watts. is a 64 y.o. male here for follow up.   Patient reports site has healed well and has no additional concerns today.  Denies fever, site bleeding or discharge. Has no new skin lesions.  He has not been wearing sun screen or using hats while outside.      PERTINENT  PMH / PSH: hx of basal cell carcinoma   OBJECTIVE:   BP 127/80    Pulse 63    Ht 6' (1.829 m)    Wt 213 lb 9.6 oz (96.9 kg)    SpO2 95%    BMI 28.97 kg/m    GEN: well appearing male in no acute distress  CVS: well perfused  RESP: speaking in full sentences without pause  SKIN: dry, warm, left lateral cheek with well healed scar, no erythema or drainage   ASSESSMENT/PLAN:   Facial basal cell cancer Well healed s/p electro-dissecation.  No new lesions.   - Advised patient to wear sunscreen when outdoors  - Advised patient to wear hat when cutting the grass or outside for an extended period of time  - Follow up for any new lesions or changes in current lesion      Lyndee Hensen, DO [place]   I was present during history physical and procedure.  Agree with plan Lind Covert

## 2020-03-23 NOTE — Patient Instructions (Addendum)
It was great seeing you today!   I'd like to see you back if the spot on the face changes or your develop any new spots.  B ut if you need to be seen earlier than that for any new issues we're happy to fit you in, just give [place] a call!   If you have questions or concerns please do not hesitate to call at [phone].  Dr. Rushie [facility]

## 2020-03-24 NOTE — Addendum Note (Signed)
Addended by: Talbert Cage L on: 03/24/2020 10:56 AM   Modules accepted: Level of Service

## 2020-03-25 ENCOUNTER — Encounter: Payer: Self-pay | Admitting: Family Medicine

## 2020-03-25 DIAGNOSIS — M67969 Unspecified disorder of synovium and tendon, unspecified lower leg: Secondary | ICD-10-CM | POA: Insufficient documentation

## 2020-03-25 DIAGNOSIS — M6790 Unspecified disorder of synovium and tendon, unspecified site: Secondary | ICD-10-CM | POA: Insufficient documentation

## 2020-03-25 NOTE — Assessment & Plan Note (Addendum)
Considered fracture, Mortons Neuroma, Osteoarthritis in differentials.  Likely pain secondary to chronic use of tendons with mild inflammation that may also be contributing factor. -Xray to rule out fracture given some pain over cuneiform  -Diclofenac gel qid -Increase gabapentin to 300 TID -Avoid constrictive shoes -Foot exercises as tolerated -Consider referral to Podiatry and Physiotherapy if symptoms continue

## 2020-05-02 ENCOUNTER — Other Ambulatory Visit: Payer: Self-pay | Admitting: *Deleted

## 2020-05-02 MED ORDER — ALLOPURINOL 100 MG PO TABS: 100.0000 mg | ORAL_TABLET | Freq: Every day | ORAL | 6 refills | Status: DC

## 2020-05-29 ENCOUNTER — Encounter: Payer: Self-pay | Admitting: Family Medicine

## 2020-05-29 ENCOUNTER — Other Ambulatory Visit: Payer: Self-pay

## 2020-05-29 ENCOUNTER — Ambulatory Visit (INDEPENDENT_AMBULATORY_CARE_PROVIDER_SITE_OTHER): Payer: Medicare Other | Admitting: Family Medicine

## 2020-05-29 DIAGNOSIS — R21 Rash and other nonspecific skin eruption: Secondary | ICD-10-CM

## 2020-05-29 MED ORDER — TRIAMCINOLONE ACETONIDE 0.1 % EX CREA
1.0000 | TOPICAL_CREAM | Freq: Two times a day (BID) | CUTANEOUS | 0 refills | Status: DC
Start: 2020-05-29 — End: 2021-02-09

## 2020-05-29 NOTE — Progress Notes (Signed)
    SUBJECTIVE:   CHIEF COMPLAINT / HPI:   Corey Watts is a 64 yr old male who presents today for a rash  Rash on legs Pt endorses pruritic rash over his shins over the last 2 weeks. He also has a circular white/grey peeling rash in the same areas. He has tried calamine lotion, chlorox, neosporin and antifungal cream without improvement. Denies recent hiking or insect bites. Denies similar rashes elsewhere on his body. Denies recent changes in medications, soaps, detergents etc. No known history of psoriasis. Denies other symptoms.  PERTINENT  PMH / PSH: HTN, CAD, Allergic Rhinitis   OBJECTIVE:   BP 132/76   Pulse 66   Ht 6' (1.829 m)   Wt 213 lb (96.6 kg)   SpO2 94%   BMI 28.89 kg/m          Maculopapular rash over shins bilaterally with scattered, irregular, white/grey plaquewith erythematous bases.   ASSESSMENT/PLAN:   Rash Unclear etiology of rash. Patient has 2 different rashes in the lower extremeties. Could be eczema as there is erythematous, pruritic with skin peeling. Could also be psoriasis based on appearance of the white/grey plaques. Plan: -Triamcinolone cream to affected areas -Follow up with me in 1-2 weeks -If rash persists despite triamcinolone cream unlikely eczema, would consider psoriasis as diagnosis. Would consider biopsy and referral to [facility].     Lattie Haw, MD PGY-2 [facility]

## 2020-05-29 NOTE — Patient Instructions (Addendum)
Great to see you today!! I am not sure what is causing your rash. It could be eczema or a fungal infection. I am prescribing a steroid cream. Please apply this to the area twice a day and follow up with me in 1-2 weeks.  Best wishes,  Dr Posey Pronto

## 2020-05-30 ENCOUNTER — Other Ambulatory Visit: Payer: Self-pay

## 2020-05-30 NOTE — Telephone Encounter (Signed)
Patients wife calls nurse line requesting a Vicodin refill. Please advise. I do not see where this was called in yesterday.

## 2020-05-31 ENCOUNTER — Telehealth: Payer: Self-pay | Admitting: Family Medicine

## 2020-05-31 ENCOUNTER — Telehealth: Payer: Self-pay

## 2020-05-31 MED ORDER — HYDROCODONE-ACETAMINOPHEN 5-325 MG PO TABS: 1.0000 | ORAL_TABLET | Freq: Two times a day (BID) | ORAL | 0 refills | Status: DC | PRN

## 2020-05-31 NOTE — Telephone Encounter (Signed)
Patient's wife dropped off covid vaccations card but in [employer].

## 2020-05-31 NOTE — Telephone Encounter (Signed)
Chart and PDMP reviewed. Appears patient takes this only sparingly. Last fill was back in the spring. Will refill.  Dr. Posey Pronto, please address chronic back pain at an upcoming visit to document that benefits of chronic narcotic therapy continue to outweigh risks.   Thanks Leeanne Rio, MD

## 2020-05-31 NOTE — Telephone Encounter (Signed)
Received phone call from pharmacy stating that patient's insurance will not accept resident [place] for narcotic prescription.   Forwarding to precepting provider. Please resend rx to [facility] on [address].   Talbot Grumbling, RN

## 2020-05-31 NOTE — Telephone Encounter (Signed)
Returned call to pharmacy. They are using full [place] number, however, insurance is still rejecting. Pharmacist states that this issue has occurred in the past. Last rx had to be sent in by Dr. Owens Shark.   Please advise   Talbot Grumbling, RN

## 2020-05-31 NOTE — Telephone Encounter (Signed)
Vaccines have been put in [place] and [place]. [name], CMA

## 2020-06-01 DIAGNOSIS — R21 Rash and other nonspecific skin eruption: Secondary | ICD-10-CM | POA: Insufficient documentation

## 2020-06-01 NOTE — Telephone Encounter (Signed)
Noted and agreed. I will do thank you Dr Ardelia Mems.

## 2020-06-01 NOTE — Assessment & Plan Note (Signed)
Unclear etiology of rash. Patient has 2 different rashes in the lower extremeties. Could be eczema as there is erythematous, pruritic with skin peeling. Could also be psoriasis based on appearance of the white/grey plaques. Plan: -Triamcinolone cream to affected areas -Follow up with me in 1-2 weeks -If rash persists despite triamcinolone cream unlikely eczema, would consider psoriasis as diagnosis. Would consider biopsy and referral to [facility].

## 2020-06-12 ENCOUNTER — Ambulatory Visit: Payer: Medicare Other | Admitting: Family Medicine

## 2020-06-15 ENCOUNTER — Other Ambulatory Visit: Payer: Self-pay

## 2020-06-15 MED ORDER — METOPROLOL SUCCINATE ER 50 MG PO TB24: 50.0000 mg | ORAL_TABLET | Freq: Every day | ORAL | 3 refills | Status: DC

## 2020-06-15 MED ORDER — ISOSORBIDE MONONITRATE ER 60 MG PO TB24: 60.0000 mg | ORAL_TABLET | Freq: Every day | ORAL | 3 refills | Status: DC

## 2020-06-29 ENCOUNTER — Other Ambulatory Visit: Payer: Self-pay

## 2020-06-29 MED ORDER — AMLODIPINE BESYLATE 10 MG PO TABS: 10.0000 mg | ORAL_TABLET | Freq: Every day | ORAL | 3 refills | Status: DC

## 2020-06-30 ENCOUNTER — Other Ambulatory Visit: Payer: Self-pay | Admitting: *Deleted

## 2020-06-30 MED ORDER — MONTELUKAST SODIUM 10 MG PO TABS: 10.0000 mg | ORAL_TABLET | Freq: Every day | ORAL | 2 refills | Status: DC

## 2020-07-03 ENCOUNTER — Telehealth: Payer: Self-pay

## 2020-07-03 MED ORDER — AMLODIPINE BESYLATE 10 MG PO TABS: 10.0000 mg | ORAL_TABLET | Freq: Every day | ORAL | 3 refills | Status: DC

## 2020-07-03 NOTE — Telephone Encounter (Signed)
Due to system wide issue with e-prescribing last week, allorphine was never received by pharmacy. I have resent this.

## 2020-07-07 ENCOUNTER — Other Ambulatory Visit: Payer: Self-pay

## 2020-07-07 MED ORDER — HYDROCODONE-ACETAMINOPHEN 5-325 MG PO TABS: 1.0000 | ORAL_TABLET | Freq: Two times a day (BID) | ORAL | 0 refills | Status: DC | PRN

## 2020-07-07 NOTE — Telephone Encounter (Signed)
Called pt regarding appointment for chronic pain and discussion of narcotics. I spoke with his wife as pt unavailable. I offered to arrange an appointment today for the next 2 weeks but she declined and would like to make a follow up on Monday by calling [facility].

## 2020-07-07 NOTE — Telephone Encounter (Signed)
Patient calls nurse line stating he can not pick up his Vicodin prescription for 10/15 (has been on hold) due to his insurance not covering a resident provider. Since I could not give verbal credentials for an attending, I had them cancel the on hold prescription and will have an attending send in a new one today.

## 2020-07-07 NOTE — Telephone Encounter (Signed)
PMP reviewed, will refill X1. Needs specific appointment to address pain. Dr. Kirby Crigler was documented in September as well. Please call the patient in the next 1-2 weeks and help arrange follow up.   Dorris Singh, MD  Family Medicine Teaching Service

## 2020-08-16 ENCOUNTER — Other Ambulatory Visit: Payer: Self-pay | Admitting: *Deleted

## 2020-08-16 MED ORDER — ALLOPURINOL 100 MG PO TABS: 100.0000 mg | ORAL_TABLET | Freq: Every day | ORAL | 6 refills | Status: DC

## 2020-08-17 ENCOUNTER — Other Ambulatory Visit: Payer: Self-pay

## 2020-08-17 MED ORDER — PRAVASTATIN SODIUM 40 MG PO TABS: ORAL_TABLET | ORAL | 3 refills | Status: DC

## 2020-09-28 ENCOUNTER — Other Ambulatory Visit: Payer: Self-pay

## 2020-09-28 MED ORDER — BUSPIRONE HCL 30 MG PO TABS: 30.0000 mg | ORAL_TABLET | Freq: Two times a day (BID) | ORAL | 3 refills | Status: DC

## 2020-09-28 NOTE — Progress Notes (Signed)
     SUBJECTIVE:   CHIEF COMPLAINT / HPI:   Corey Watts. is a 65 y.o. male presents with for back pain  Narcotic use Pt takes Norco/Vicodin 5-325 mg 1 to 2 tablets every day and is requesting a refill.  He has been taking this for his lumbar back pain which she has had since 1989.  He had 2 surgeries for vertebral disc removal and since then has suffered from scar tissue which has made the lumbar pain worse.  He has tried many over-the-counter medicines, ice, heat therapies without much help Norco/Vicodin is the only medicine that helps him.  Pain usually radiates to left leg and gets worse during the winter season.  Denies history of trauma, history of prolonged steroid use, bowel or bladder incontinence, urinary retention, or saddle anesthesia, weakness in extremities, fevers, chills, IV drug use, hemodialysis, hx cancer, changes in weight, night sweats. No history of osteoporosis  Occupation: No heavy lifting, repetitive vibrations, bending or twisting motions.  Health maintenance  Covid vaccine: Refused booster today Flu vaccine: Refused flu vaccine  PERTINENT  PMH / PSH: CAD, HTN, allergic rhinitis, lumbar back pain   OBJECTIVE:   BP (!) 142/82   Pulse 75   Wt 219 lb (99.3 kg)   SpO2 95%   BMI 29.70 kg/m    General: Alert, no acute distress, cooperative  Cardio: Well-perfused Pulm: Normal work of breathing Extremities: No peripheral edema.  Neuro: Cranial nerves grossly intact  Back Normal skin, Spine with normal alignment and no deformity.  No tenderness to vertebral process palpation.  Mild tenderness on palpation of left and right paraspinous muscles.  Full range of motion at neck region.  Limited range of motion at lumbar sacral region.  Positive straight leg test of left leg. Sensation: Normal sensation throughout lower extremities Strength: 5/5 strength in lower extremities Gait: Walks slowly, not antalgic  ASSESSMENT/PLAN:   Chronic back pain Pain is  likely 2/2 degenerative disc and facet disease. Straight leg raise positive over left leg indicating lumbar radiculopathy. Discussed options of pain control. Explained I am happy to refill Vicodin with the goal to slowly wean down the medication and find adjunctive therapies to help with the pain. Pt does not want to have further orthopedic surgery but is open to seeing sports medicine for lumbar back pain.  PDMP reviewed. Dr Owens Shark represcribed patient's Vicodin 1-2 tablet twice daily, 60 tablets. Follow up as needed.     Lattie Haw, MD PGY-2 [place]

## 2020-10-04 DIAGNOSIS — Z20822 Contact with and (suspected) exposure to covid-19: Secondary | ICD-10-CM | POA: Diagnosis not present

## 2020-10-06 ENCOUNTER — Encounter: Payer: Self-pay | Admitting: Family Medicine

## 2020-10-06 ENCOUNTER — Other Ambulatory Visit: Payer: Self-pay

## 2020-10-06 ENCOUNTER — Ambulatory Visit (INDEPENDENT_AMBULATORY_CARE_PROVIDER_SITE_OTHER): Payer: Medicare Other | Admitting: Family Medicine

## 2020-10-06 VITALS — BP 142/82 | HR 75 | Wt 219.0 lb

## 2020-10-06 DIAGNOSIS — M5442 Lumbago with sciatica, left side: Secondary | ICD-10-CM | POA: Diagnosis not present

## 2020-10-06 DIAGNOSIS — M5441 Lumbago with sciatica, right side: Secondary | ICD-10-CM

## 2020-10-06 DIAGNOSIS — R739 Hyperglycemia, unspecified: Secondary | ICD-10-CM

## 2020-10-06 DIAGNOSIS — G8929 Other chronic pain: Secondary | ICD-10-CM

## 2020-10-06 LAB — POCT GLYCOSYLATED HEMOGLOBIN (HGB A1C): Hemoglobin A1C: 5.8 % — AB (ref 4.0–5.6)

## 2020-10-06 MED ORDER — HYDROCODONE-ACETAMINOPHEN 5-325 MG PO TABS: 1.0000 | ORAL_TABLET | Freq: Two times a day (BID) | ORAL | 0 refills | Status: DC | PRN

## 2020-10-06 NOTE — Patient Instructions (Signed)
Thank you for coming to see me today. It was a pleasure. Today we discussed more about your pain medications and back pain. I am referring you to sports medicine where they can find some ways to help you with your back pain. I have refilled the pain medication for you.  Based on your lab results you are pre-diabetic. We can check this again in 3-6 months time.  Please follow-up with me in 4 weeks so we can check in about your back pain and diet and exercise.  If you have any questions or concerns, please do not hesitate to call the office at [phone].  Best wishes,   Dr Posey Pronto

## 2020-10-06 NOTE — Assessment & Plan Note (Deleted)
-  Referred to sports medicine -Refilled Vicodin

## 2020-10-06 NOTE — Assessment & Plan Note (Addendum)
Pain is likely 2/2 degenerative disc and facet disease. Straight leg raise positive over left leg indicating lumbar radiculopathy. Discussed options of pain control. Explained I am happy to refill Vicodin with the goal to slowly wean down the medication and find adjunctive therapies to help with the pain. Pt does not want to have further orthopedic surgery but is open to seeing sports medicine for lumbar back pain.  PDMP reviewed. Dr Owens Shark represcribed patient's Vicodin 1-2 tablet twice daily, 60 tablets. Follow up as needed.

## 2020-10-20 ENCOUNTER — Other Ambulatory Visit: Payer: Self-pay

## 2020-10-20 ENCOUNTER — Ambulatory Visit (INDEPENDENT_AMBULATORY_CARE_PROVIDER_SITE_OTHER): Payer: Medicare Other | Admitting: Family Medicine

## 2020-10-20 DIAGNOSIS — M5442 Lumbago with sciatica, left side: Secondary | ICD-10-CM | POA: Diagnosis not present

## 2020-10-20 DIAGNOSIS — M6530 Trigger finger, unspecified finger: Secondary | ICD-10-CM

## 2020-10-20 DIAGNOSIS — G8929 Other chronic pain: Secondary | ICD-10-CM

## 2020-10-20 NOTE — Assessment & Plan Note (Signed)
Patient with long history of lumbar back pain with occasional radicular symptoms. Back pain is status quo and pain overall well-tolerated with norco. PDMP reviewed and appropriate. Reviewed x-rays from 12/2019 and pain is most likely related to DJD. He is not interested in surgery, no reason for further imaging at this time. Given symptoms, imaging and management with norco, it is reasonable to continue with current treatment. Due to cost, patient is interested in continuing current conservative treatment and adding in HEP. Exercises given today. Follow up as needed.

## 2020-10-20 NOTE — Progress Notes (Addendum)
SUBJECTIVE:   CHIEF COMPLAINT / HPI:  Back pain, hand pain  Back: 65 yo gentleman with long standing history of low back pain. His back pain is usually aching in nature with radicular numbness and tingling that radiates down the left leg. He has trouble bending over, lifting >10 lbs, and going up steps. He initially had discectomy x2 in 1989, 1990. He has tried OTC analgesics, norco, flexeril, ice, heat, rest, and PT in the past. He uses norco 5mg  as needed for severe pain that prevents him from sleeping, he uses 180 pills per year, PDMP reviewed and appropriate. Lumbar spine x-rays from April 2021 reveal DJD throughout the lumbar spine, no fractures. He denies any recent trauma, no worsening of pain acutely. He denies incontinence and saddle anesthesia.  Hands: Patient reports pain in bilateral hands. He has dx of raynaud's phenomenon and specifically notices color change (pale finger tips) in the morning that improves with warm water. He also notices that some of his knuckles have increased in size over time, and he feels he sometimes has difficulty gripping with his fists due to pain. In the last 6 months he has also noticed catching of his b/l third digits and he has to use the opposite hand to release those fingers. This now happens almost every morning.   PERTINENT  PMH / PSH: sciatica and lumbar back pain, peripheral neuropathy, raynaud's phenomenon  OBJECTIVE:   BP 140/80   Ht 6' (1.829 m)   Wt 217 lb (98.4 kg)   BMI 29.43 kg/m   Nursing note and vitals reviewed GEN: older WM resting comfortably in chair, NAD, WNWD Hands: Inspection yielded no erythema, ecchymosis, or swelling. Herbeden's nodes noted at Right 2-4th PIPs and DIPs, Left 3rd DIP and 2-4th PIPS. ROM full with good flexion and extension and ulnar/radial deviation that is symmetrical with opposite wrist. Lumbar spine: - Inspection: no gross deformity or asymmetry, swelling or ecchymosis. No skin changes - Palpation:  mild TTP over the midline L3-L5 spinous processes; no TTP over paraspinal muscles, or SI joints b/l - ROM: limited active ROM of the lumbar spine in flexion and extension with some pain - Strength: 5/5 strength of lower extremity in L4-S1 nerve root distributions b/l - Neuro: sensation intact in the L4-S1 nerve root distribution b/l, 2+ L4 and S1 reflexes - Straight Leg Raise test: positive on left, negative on right Neuro: AOx3 Ext: no edema Psych: Pleasant and appropriate   ASSESSMENT/PLAN:   Chronic low back pain with left-sided sciatica Patient with long history of lumbar back pain with occasional radicular symptoms. Back pain is status quo and pain overall well-tolerated with norco. PDMP reviewed and appropriate. Reviewed x-rays from 12/2019 and pain is most likely related to DJD. He is not interested in surgery, no reason for further imaging at this time. Given symptoms, imaging and management with norco, it is reasonable to continue with current treatment. Due to cost, patient is interested in continuing current conservative treatment and adding in HEP. Exercises given today. Follow up as needed.  Trigger finger, acquired B/l 3rd digit trigger finger. Patient does not experience constantly, and he has added problems of hand OA b/l and raynaud's phenomenon.  Discussed option of CSI, patient has pre-diabetes. Currently, he reports that his trigger finger improves with warm water therapy and prefers to continue this treatment. Follow up as needed.     Gladys Damme, MD [place]   Resident Attestation   I saw and  evaluated the patient, performing the key elements of the service.I  personally performed or re-performed the history, physical exam, and medical decision making activities of this service and have verified that the service and findings are accurately documented in the resident's note. I developed the management plan that is described in the resident's  note, and I agree with the content, with my edits above.   Harolyn Rutherford, DO [facility], PGY-4  I was the preceptor for this visit and available for immediate consultation to both the resident and the sports medicine fellow Shellia Cleverly, DO

## 2020-10-20 NOTE — Assessment & Plan Note (Signed)
B/l 3rd digit trigger finger. Patient does not experience constantly, and he has added problems of hand OA b/l and raynaud's phenomenon.  Discussed option of CSI, patient has pre-diabetes. Currently, he reports that his trigger finger improves with warm water therapy and prefers to continue this treatment. Follow up as needed.

## 2020-10-20 NOTE — Patient Instructions (Signed)
It was great to meet you today! Thank you for letting me participate in your care!  Today, we discussed your chronic low back pain and I have given you some home exercises to do. I have also given you some exercises for your hands.  In regards to your fingers locking up in the morning, if that continues to get worse or you just get tired of it occurring you can return and I will inject them with a steroid which should bring you relief.  Be well, Harolyn Rutherford, DO PGY-4, Sports Medicine Fellow [place]

## 2020-10-28 ENCOUNTER — Other Ambulatory Visit: Payer: Self-pay | Admitting: Family Medicine

## 2020-10-30 ENCOUNTER — Other Ambulatory Visit: Payer: Self-pay | Admitting: Family Medicine

## 2020-11-16 ENCOUNTER — Ambulatory Visit (INDEPENDENT_AMBULATORY_CARE_PROVIDER_SITE_OTHER): Payer: Medicare Other

## 2020-11-16 ENCOUNTER — Other Ambulatory Visit: Payer: Self-pay

## 2020-11-16 VITALS — Ht 72.0 in | Wt 216.0 lb

## 2020-11-16 DIAGNOSIS — Z0000 Encounter for general adult medical examination without abnormal findings: Secondary | ICD-10-CM

## 2020-11-16 NOTE — Patient Instructions (Addendum)
You spoke to Corey Watts, [name] over the phone for your annual wellness visit.  We discussed goals: Goals    . Weight (lb) < 200 lb (90.7 kg)     Discussed portion control. See AVS for healthy plate tips.       We also discussed recommended health maintenance. As discussed, you are pretty much up to date with everything. Just schedule your covid booster in nurse clinic!  Health Maintenance  Topic Date Due  . COVID-19 Vaccine (3 - Pfizer risk 4-dose series) 05/21/2020  . INFLUENZA VACCINE  12/14/2020 (Originally 04/16/2020)  . TETANUS/TDAP  06/30/2023  . COLONOSCOPY (Pts 45-41yrs Insurance coverage will need to be confirmed)  12/18/2024  . Hepatitis C Screening  Completed  . HIV Screening  Completed  . HPV VACCINES  Aged Out   You are due for your Covid Booster. Fill out an advance directive packet. See AVS for healthy plate and weight management tips. I have put in a refill request for you pain medication. Schedule with PCP as recommended.   Here is an example of what a healthy plate looks like:    ? Make half your plate fruits and vegetables.     ? Focus on whole fruits.     ? Vary your veggies.  ? Make half your grains whole grains. -     ? Look for the word "whole" at the beginning of the ingredients list    ? Some whole-grain ingredients include whole oats, whole-wheat flour,        whole-grain corn, whole-grain brown rice, and whole rye.  ? Move to low-fat and fat-free milk or yogurt.  ? Vary your protein routine. - Meat, fish, poultry (chicken, [place]), eggs, beans (kidney, pinto), dairy.  ? Drink and eat less sodium, saturated fat, and added sugars.  Health Maintenance, Male Adopting a healthy lifestyle and getting preventive care are important in promoting health and wellness. Ask your health care provider about:  The right schedule for you to have regular tests and exams.  Things you can do on your own to prevent diseases and keep yourself healthy. What  should I know about diet, weight, and exercise? Eat a healthy diet  Eat a diet that includes plenty of vegetables, fruits, low-fat dairy products, and lean protein.  Do not eat a lot of foods that are high in solid fats, added sugars, or sodium.   Maintain a healthy weight Body mass index (BMI) is a measurement that can be used to identify possible weight problems. It estimates body fat based on height and weight. Your health care provider can help determine your BMI and help you achieve or maintain a healthy weight. Get regular exercise Get regular exercise. This is one of the most important things you can do for your health. Most adults should:  Exercise for at least 150 minutes each week. The exercise should increase your heart rate and make you sweat (moderate-intensity exercise).  Do strengthening exercises at least twice a week. This is in addition to the moderate-intensity exercise.  Spend less time sitting. Even light physical activity can be beneficial. Watch cholesterol and blood lipids Have your blood tested for lipids and cholesterol at 65 years of age, then have this test every 5 years. You may need to have your cholesterol levels checked more often if:  Your lipid or cholesterol levels are high.  You are older than 65 years of age.  You are at high risk for heart disease. What  should I know about cancer screening? Many types of cancers can be detected early and may often be prevented. Depending on your health history and family history, you may need to have cancer screening at various ages. This may include screening for:  Colorectal cancer.  Prostate cancer.  Skin cancer.  Lung cancer. What should I know about heart disease, diabetes, and high blood pressure? Blood pressure and heart disease  High blood pressure causes heart disease and increases the risk of stroke. This is more likely to develop in people who have high blood pressure readings, are of African  descent, or are overweight.  Talk with your health care provider about your target blood pressure readings.  Have your blood pressure checked: ? Every 3-5 years if you are 55-10 years of age. ? Every year if you are 75 years old or older.  If you are between the ages of 60 and 28 and are a current or former smoker, ask your health care provider if you should have a one-time screening for abdominal aortic aneurysm (AAA). Diabetes Have regular diabetes screenings. This checks your fasting blood sugar level. Have the screening done:  Once every three years after age 29 if you are at a normal weight and have a low risk for diabetes.  More often and at a younger age if you are overweight or have a high risk for diabetes. What should I know about preventing infection? Hepatitis B If you have a higher risk for hepatitis B, you should be screened for this virus. Talk with your health care provider to find out if you are at risk for hepatitis B infection. Hepatitis C Blood testing is recommended for:  Everyone born from [birth year] through 1965.  Anyone with known risk factors for hepatitis C. Sexually transmitted infections (STIs)  You should be screened each year for STIs, including gonorrhea and chlamydia, if: ? You are sexually active and are younger than 65 years of age. ? You are older than 65 years of age and your health care provider tells you that you are at risk for this type of infection. ? Your sexual activity has changed since you were last screened, and you are at increased risk for chlamydia or gonorrhea. Ask your health care provider if you are at risk.  Ask your health care provider about whether you are at high risk for HIV. Your health care provider may recommend a prescription medicine to help prevent HIV infection. If you choose to take medicine to prevent HIV, you should first get tested for HIV. You should then be tested every 3 months for as long as you are taking the  medicine. Follow these instructions at home: Lifestyle  Do not use any products that contain nicotine or tobacco, such as cigarettes, e-cigarettes, and chewing tobacco. If you need help quitting, ask your health care provider.  Do not use street drugs.  Do not share needles.  Ask your health care provider for help if you need support or information about quitting drugs. Alcohol use  Do not drink alcohol if your health care provider tells you not to drink.  If you drink alcohol: ? Limit how much you have to 0-2 drinks a day. ? Be aware of how much alcohol is in your drink. In the [place] one drink equals one 12 oz bottle of beer (355 mL), one 5 oz glass of wine (148 mL), or one 1 oz glass of hard liquor (44 mL). General instructions  Schedule regular health,  dental, and eye exams.  Stay current with your vaccines.  Tell your health care provider if: ? You often feel depressed. ? You have ever been abused or do not feel safe at home. Summary  Adopting a healthy lifestyle and getting preventive care are important in promoting health and wellness.  Follow your health care provider's instructions about healthy diet, exercising, and getting tested or screened for diseases.  Follow your health care provider's instructions on monitoring your cholesterol and blood pressure. This information is not intended to replace advice given to you by your health care provider. Make sure you discuss any questions you have with your health care provider. Document Revised: 08/26/2018 Document Reviewed: 08/26/2018 Elsevier Patient Education  2021 [place].     Our clinic's number is [phone]. Please call with questions or concerns about what we discussed today.

## 2020-11-16 NOTE — Progress Notes (Addendum)
Subjective:   Corey CLAUSON Sr. is a 65 y.o. male who presents for Medicare Annual/Subsequent preventive examination.  The patient consented to a virtual visit. Patient consented to have virtual visit and was identified by name and date of birth. Method of visit: Telephone   Encounter participants: Patient: Corey NURSE Sr. - located at Home Nurse/Provider: Dorna Bloom - located at [facility] Others (if applicable): NA  Time spent: 30 minutes   Review of Systems: Defer to PCP  Objective:    Vitals: Ht 6' (1.829 m)   Wt 216 lb (98 kg)   BMI 29.29 kg/m   Body mass index is 29.29 kg/m.  Advanced Directives 11/16/2020 11/16/2020 11/16/2020 10/06/2020 05/29/2020 12/27/2019 10/24/2017  Does Patient Have a Medical Advance Directive? No - No No No No No  Would patient like information on creating a medical advance directive? - Yes (MAU/Ambulatory/Procedural Areas - Information given) Yes (MAU/Ambulatory/Procedural Areas - Information given) No - Patient declined No - Patient declined No - Patient declined No - Patient declined   Tobacco Social History   Tobacco Use  Smoking Status Former Smoker  . Packs/day: 2.00  Smokeless Tobacco Former Systems developer  . Quit date: 73  Tobacco Comment   2PPD smoker until 1991     Counseling given: No plans to restart  Clinical Intake:  Pre-visit preparation completed: Yes  Pain Score: 0-No pain  How often do you need to have someone help you when you read instructions, pamphlets, or other written materials from your doctor or pharmacy?: 2 - Rarely What is the last grade level you completed in school?: 8th grade  Past Medical History:  Diagnosis Date  . Allergy    Allergic rhinitis  . Angina pectoris ([place]) 2006   Known coronary vasospasm -- treated with combination of long-acting nitrates and calcium channel blockers; NTG when necessary  . CAD (coronary artery disease) 04/2005   Mild obstructive ramus intermedius disease (30-50%) and 50-60% proximal   . Chronic back pain    Stemming from prior lumbar diskectomy  . GERD (gastroesophageal reflux disease)   . Hyperlipidemia   . Hypertension   . Obesity   . Substance abuse ([place])    Known chronic alcoholic - not interested in quitting (current as of 2014)   Past Surgical History:  Procedure Laterality Date  . BACK SURGERY  1989, 1997   Chronic back pain stemming from this  . CARDIOVASCULAR STRESS TEST  2005   Dr. Daneen Schick -- Cardiolite stress test showed no evidence of ischemia  . COLONOSCOPY W/ BIOPSIES  2011   Multiple small polyps and s/p polypectomy -- recommended repeat in 2 years due to multiple small polyps  . ESOPHAGEAL DILATION  1997   Family History  Problem Relation Age of Onset  . Heart failure Mother   . Heart failure Father   . Heart disease Father    Social History   Socioeconomic History  . Marital status: Married    Spouse name: Hoyle Sauer  . Number of children: 2  . Years of education: 8  . Highest education level: 8th grade  Occupational History  . Occupation: Disabled   Tobacco Use  . Smoking status: Former Smoker    Packs/day: 2.00  . Smokeless tobacco: Former Systems developer    Quit date: 1991  . Tobacco comment: 2PPD smoker until 80  Vaping Use  . Vaping Use: Never used  Substance and Sexual Activity  . Alcohol use: Yes    Alcohol/week: 3.0  standard drinks    Types: 3 Cans of beer per week  . Drug use: Not Currently  . Sexual activity: Yes    Birth control/protection: Post-menopausal, Surgical  Other Topics Concern  . Not on file  Social History Narrative   Patient lives with his wife Hoyle Sauer.    Patient has 2 adult children, with 6 grandchildren, 1 great grandchild.    Patients family lives all on the same street.    Patient enjoys being outside, fixing up his home, and spending time with his family.    Social Determinants of Health   Financial Resource Strain: Low Risk   . Difficulty of Paying Living Expenses: Not hard at all  Food  Insecurity: No Food Insecurity  . Worried About Charity fundraiser in the Last Year: Never true  . Ran Out of Food in the Last Year: Never true  Transportation Needs: No Transportation Needs  . Lack of Transportation (Medical): No  . Lack of Transportation (Non-Medical): No  Physical Activity: Inactive  . Days of Exercise per Week: 0 days  . Minutes of Exercise per Session: 0 min  Stress: No Stress Concern Present  . Feeling of Stress : Only a little  Social Connections: Moderately Isolated  . Frequency of Communication with Friends and Family: More than three times a week  . Frequency of Social Gatherings with Friends and Family: More than three times a week  . Attends Religious Services: Never  . Active Member of Clubs or Organizations: No  . Attends Archivist Meetings: Never  . Marital Status: Married   Outpatient Encounter Medications as of 11/16/2020  Medication Sig  . allopurinol (ZYLOPRIM) 100 MG tablet TAKE 1 TABLET(100 MG) BY MOUTH DAILY  . allopurinol (ZYLOPRIM) 100 MG tablet TAKE 1 TABLET(100 MG) BY MOUTH DAILY  . amitriptyline (ELAVIL) 50 MG tablet TAKE 1 TABLET(50 MG) BY MOUTH AT BEDTIME AS NEEDED FOR SLEEP  . amLODipine (NORVASC) 10 MG tablet Take 1 tablet (10 mg total) by mouth daily.  Marland Kitchen aspirin 81 MG EC tablet Take 81 mg by mouth daily.  . busPIRone (BUSPAR) 30 MG tablet Take 1 tablet (30 mg total) by mouth 2 (two) times daily.  . cyclobenzaprine (FLEXERIL) 10 MG tablet Take 1 tablet (10 mg total) by mouth 3 (three) times daily as needed for muscle spasms.  . diclofenac Sodium (VOLTAREN) 1 % GEL Apply 4 g topically 4 (four) times daily.  Marland Kitchen gabapentin (NEURONTIN) 300 MG capsule Take 1 capsule (300 mg total) by mouth 3 (three) times daily.  Marland Kitchen HYDROcodone-acetaminophen (NORCO/VICODIN) 5-325 MG tablet Take 1 tablet by mouth 2 (two) times daily as needed. Needs appointment before next refill.  Marland Kitchen ibuprofen (ADVIL) 800 MG tablet Take 1 tablet (800 mg total) by mouth  every 8 (eight) hours as needed.  . isosorbide mononitrate (IMDUR) 60 MG 24 hr tablet Take 1 tablet (60 mg total) by mouth daily.  . metoprolol succinate (TOPROL-XL) 50 MG 24 hr tablet Take 1 tablet (50 mg total) by mouth at bedtime. Take with or immediately following a meal.  . montelukast (SINGULAIR) 10 MG tablet Take 1 tablet (10 mg total) by mouth at bedtime.  . nitroGLYCERIN (NITROSTAT) 0.4 MG SL tablet PLACE 1 TABLET UNDER THE TONGUE IF NEEDED FOR CHEST PAIN EVERY 5 MINUTES FOR 3 DOSES. IF NO RELIEF AFTER 3RD DOSE CALL MD OR 911  . omeprazole (PRILOSEC) 20 MG capsule TAKE 1 CAPSULE(20 MG) BY MOUTH DAILY  . pravastatin (PRAVACHOL) 40  MG tablet TAKE 1 TABLET(40 MG) BY MOUTH DAILY  . triamcinolone cream (KENALOG) 0.1 % Apply 1 application topically 2 (two) times daily. (Patient not taking: Reported on 11/16/2020)  . [DISCONTINUED] diclofenac (VOLTAREN) 50 MG EC tablet Take 1 tablet (50 mg total) by mouth 2 (two) times daily as needed for moderate pain. Do not use for longer than one more week.   No facility-administered encounter medications on file as of 11/16/2020.   Activities of Daily Living In your present state of health, do you have any difficulty performing the following activities: 11/16/2020  Hearing? N  Vision? N  Difficulty concentrating or making decisions? N  Walking or climbing stairs? N  Dressing or bathing? N  Doing errands, shopping? N  Preparing Food and eating ? N  Using the Toilet? N  In the past six months, have you accidently leaked urine? N  Do you have problems with loss of bowel control? N  Managing your Medications? N  Managing your Finances? N  Housekeeping or managing your Housekeeping? N  Some recent data might be hidden   Patient Care Team: Lattie Haw, MD as PCP - General (Family Medicine)   Assessment:   This is a routine wellness examination for Nicholaos.  Exercise Activities and Dietary recommendations Current Exercise Habits: The patient does not  participate in regular exercise at present, Exercise limited by: orthopedic condition(s)  Goals    . Weight (lb) < 200 lb (90.7 kg)     Discussed portion control. See AVS for healthy plate tips.       Fall Risk Fall Risk  11/16/2020 10/06/2020 03/22/2020 12/27/2019 06/07/2019  Falls in the past year? 0 0 0 0 0  Number falls in past yr: - 0 0 0 0  Injury with Fall? - 0 - 0 -  Follow up - - Falls evaluation completed - Falls evaluation completed   Is the patient's home free of loose throw rugs in walkways, pet beds, electrical cords, etc?   yes      Grab bars in the bathroom? yes      Handrails on the stairs?   yes      Adequate lighting?   yes  Timed Get Up and Go Performed: 8  Depression Screen PHQ 2/9 Scores 11/16/2020 10/06/2020 05/29/2020 03/23/2020  PHQ - 2 Score 0 0 0 0  PHQ- 9 Score - 1 0 -   Cognitive Function  6CIT Screen 11/16/2020  What Year? 0 points  What month? 0 points  What time? 0 points  Count back from 20 0 points  Months in reverse 0 points   Immunization History  Administered Date(s) Administered  . PFIZER(Purple Top)SARS-COV-2 Vaccination 04/02/2020, 04/23/2020  . Td 10/17/2002  . Tdap 06/29/2013   Screening Tests Health Maintenance  Topic Date Due  . COVID-19 Vaccine (3 - Pfizer risk 4-dose series) 05/21/2020  . INFLUENZA VACCINE  12/14/2020 (Originally 04/16/2020)  . TETANUS/TDAP  06/30/2023  . COLONOSCOPY (Pts 45-33yrs Insurance coverage will need to be confirmed)  12/18/2024  . Hepatitis C Screening  Completed  . HIV Screening  Completed  . HPV VACCINES  Aged Out   Cancer Screenings: Lung: Low Dose CT Chest recommended if Age 75-80 years, 30 pack-year currently smoking OR have quit w/in 15years. Patient does not qualify. Colorectal: 2026 next anticipated due date  Additional Screenings:  Hepatitis C Screening: Completed  HIV Screening: Completed    Plan:  You are due for your Covid Booster. Fill out an advance  directive packet. See AVS for  healthy plate and weight management tips. I have put in a refill request for you pain medication. Schedule with PCP as recommended.   I have personally reviewed and noted the following in the patient's chart:   . Medical and social history . Use of alcohol, tobacco or illicit drugs  . Current medications and supplements . Functional ability and status . Nutritional status . Physical activity . Advanced directives . List of other physicians . Hospitalizations, surgeries, and ER visits in previous 12 months . Vitals . Screenings to include cognitive, depression, and falls . Referrals and appointments  In addition, I have reviewed and discussed with patient certain preventive protocols, quality metrics, and best practice recommendations. A written personalized care plan for preventive services as well as general preventive health recommendations were provided to patient.  This visit was conducted virtually in the setting of the [place] pandemic.    Dorna Bloom, [place]  11/16/2020   I have reviewed this visit and agree with the documentation.

## 2020-11-20 MED ORDER — HYDROCODONE-ACETAMINOPHEN 5-325 MG PO TABS: 1.0000 | ORAL_TABLET | Freq: Two times a day (BID) | ORAL | 0 refills | Status: DC | PRN

## 2020-11-20 NOTE — Telephone Encounter (Signed)
Please see below message from Dr. Posey Pronto.    Lattie Haw, MD  P [address]; Clydean Posas, Betti Cruz, RN; [place], Waldemar Dickens, CMA Hi team,   I received request to refill patient's narcotic. Only attendings can refill this because his insurance does not allow residents to fill it. In the future can you forward this request to an attending as I have this problem every time.   Please could you kindly forward the request to any attending explaining why I cannot fill it.   Thank you   [place]   Talbot Grumbling, RN

## 2020-12-26 ENCOUNTER — Telehealth: Payer: Self-pay

## 2020-12-26 NOTE — Telephone Encounter (Signed)
Violet from [employer] called to see if a fax for a grievance and request of medical records was received. She will re-fax today

## 2020-12-27 NOTE — Telephone Encounter (Signed)
Thank you this has been addressed by myself and Dr Andria Frames. He has escalated the complaint to risk management.

## 2021-01-15 ENCOUNTER — Other Ambulatory Visit: Payer: Self-pay

## 2021-01-17 MED ORDER — HYDROCODONE-ACETAMINOPHEN 5-325 MG PO TABS: 1.0000 | ORAL_TABLET | Freq: Two times a day (BID) | ORAL | 0 refills | Status: DC | PRN

## 2021-01-23 ENCOUNTER — Other Ambulatory Visit: Payer: Self-pay | Admitting: Family Medicine

## 2021-01-23 DIAGNOSIS — G6289 Other specified polyneuropathies: Secondary | ICD-10-CM

## 2021-01-24 ENCOUNTER — Other Ambulatory Visit: Payer: Self-pay | Admitting: Family Medicine

## 2021-01-24 DIAGNOSIS — G6289 Other specified polyneuropathies: Secondary | ICD-10-CM

## 2021-01-25 NOTE — Telephone Encounter (Signed)
Patient's wife returns call to nurse line to check status of rx refill request.   Talbot Grumbling, RN

## 2021-02-09 ENCOUNTER — Ambulatory Visit (INDEPENDENT_AMBULATORY_CARE_PROVIDER_SITE_OTHER): Payer: Medicare Other | Admitting: Family Medicine

## 2021-02-09 ENCOUNTER — Other Ambulatory Visit: Payer: Self-pay

## 2021-02-09 ENCOUNTER — Encounter: Payer: Self-pay | Admitting: Family Medicine

## 2021-02-09 VITALS — BP 152/78 | HR 86 | Ht 72.0 in | Wt 217.2 lb

## 2021-02-09 DIAGNOSIS — L02213 Cutaneous abscess of chest wall: Secondary | ICD-10-CM | POA: Diagnosis not present

## 2021-02-09 MED ORDER — DOXYCYCLINE HYCLATE 100 MG PO TABS: 100.0000 mg | ORAL_TABLET | Freq: Two times a day (BID) | ORAL | 0 refills | Status: AC

## 2021-02-09 MED ORDER — MUPIROCIN 2 % EX OINT: 1.0000 "application " | TOPICAL_OINTMENT | Freq: Two times a day (BID) | CUTANEOUS | 0 refills | Status: DC

## 2021-02-09 NOTE — Progress Notes (Signed)
    SUBJECTIVE:   CHIEF COMPLAINT / HPI:    Boil on Chest Started as a "knot" 2 weeks ago. Progressively became bigger and more painful. Then started draining purulent material about 1 week ago. States it looks slightly better now that it's draining but is still very sore. He has been applying Neosporin 2x/day and peroxide 1x/day. No fevers, chills, or increased warmth that he knows of. Reports he has had multiple cysts/abscesses/boils in the past that have required I&D.  PERTINENT  PMH / PSH: basal cell carcinoma, HTN, GERD, gout  OBJECTIVE:   BP (!) 152/78   Pulse 86   Ht 6' (1.829 m)   Wt 217 lb 3.2 oz (98.5 kg)   SpO2 99%   BMI 29.46 kg/m   General: NAD, pleasant, able to participate in exam Respiratory:  normal effort Skin: 2.0 x 1.8cm firm nodule with large area of central crusting as shown below. Blanchable maculopapular rash on central upper chest, but no erythema or warmth surrounding the abscess  Neuro: alert, no obvious focal deficits Psych: Normal affect and mood   ASSESSMENT/PLAN:   Skin Abscess  Located on anterior chest. Draining spontaneously therefore no indication for I&D today.  -Doxy 100mg  BID x7 days. Counseled on avoiding sun exposure, taking w/food to reduce GI upset. -Mupirocin ointment BID -Return precautions discussed   Alcus Dad, MD [place]

## 2021-02-09 NOTE — Patient Instructions (Addendum)
It was great to meet you!  For the bump on your chest: - We are going to start an antibiotic called Doxycycline. Take one pill twice daily for the next 7 days. - I will also prescribe a topical antibiotic that is a little stronger than neosporin. You can use this twice daily as well. - If you develop fever, chills, streaking redness, warmth or other concerns please call our office or seek immediate care.  Take care,  Dr. [facility]

## 2021-03-09 ENCOUNTER — Other Ambulatory Visit: Payer: Self-pay | Admitting: *Deleted

## 2021-03-12 ENCOUNTER — Encounter: Payer: Self-pay | Admitting: Family Medicine

## 2021-03-12 ENCOUNTER — Ambulatory Visit (INDEPENDENT_AMBULATORY_CARE_PROVIDER_SITE_OTHER): Payer: Medicare Other | Admitting: Family Medicine

## 2021-03-12 ENCOUNTER — Other Ambulatory Visit: Payer: Self-pay

## 2021-03-12 DIAGNOSIS — H9192 Unspecified hearing loss, left ear: Secondary | ICD-10-CM | POA: Insufficient documentation

## 2021-03-12 HISTORY — DX: Unspecified hearing loss, left ear: H91.92

## 2021-03-12 NOTE — Patient Instructions (Signed)
Someone should call about the ENT referral.  I did not get a good look at your eardrum on that side.   You are due for several shots.  In order of importance: Pneumonia shot - once after 65 Covid vaccine - you are due a booster Flu every flu shot Shingles vaccine.

## 2021-03-12 NOTE — Progress Notes (Signed)
    SUBJECTIVE:   CHIEF COMPLAINT / HPI:   Acute hearing loss left ear.  Patient states that several days ago had a severe headache lasting only 3-4 minutes but very intense.  This resolved with mild lingering headache that responded to tylenol.  Since that headache, he states he has marked decrease in hearing of the left ear.  Right ear feels normal.  No fever or URI sx.  No focal neuro deficits.  No trauma or barotrauma.Does have known CAD so he is at risk for cerebrovascular diseae. We also talked about several vaccines which were due.  He declined all for now.      OBJECTIVE:   BP 130/74   Pulse 77   Ht 6' (1.829 m)   Wt 217 lb 12.8 oz (98.8 kg)   SpO2 96%   BMI 29.54 kg/m   Cerumen bilaterally.  Cleared on right canal with irrigation.  Normal appearing TM.  Still some deep cerumen in left ear despite irrigation.  I don't see enough of TM to convince myself it is normal. No cervical adenopathy. Neuro: Motor, sensory, speech, gait, cognition and affect all norma.  ASSESSMENT/PLAN:   Acute hearing loss of left ear Not a simple cerumen impaction.  Needs full WU.  Referred to ENT.  I realized after patient had left that it may take a while for the eNT referral.  As such, I called him back and ordered a contrast MRI, which is part of recommended evaluation.       Zenia Resides, MD [place]

## 2021-03-12 NOTE — Assessment & Plan Note (Signed)
Not a simple cerumen impaction.  Needs full WU.  Referred to ENT.  I realized after patient had left that it may take a while for the eNT referral.  As such, I called him back and ordered a contrast MRI, which is part of recommended evaluation.

## 2021-03-13 MED ORDER — OMEPRAZOLE 20 MG PO CPDR: DELAYED_RELEASE_CAPSULE | ORAL | 3 refills | Status: DC

## 2021-03-16 ENCOUNTER — Telehealth: Payer: Self-pay

## 2021-03-16 NOTE — Telephone Encounter (Signed)
Called GI. Hinton Dyer told me she would call Corey Watts today and schedule the MRI. She needs to ask him some questions and said it is easier if she calls the pt. Called pt and told him GI would be calling him today to schedule the MRI. Pt understands and thanked me for calling and letting him know. [name], CMA

## 2021-03-20 ENCOUNTER — Ambulatory Visit
Admission: RE | Admit: 2021-03-20 | Discharge: 2021-03-20 | Disposition: A | Discharge status: Home / Self Care | Payer: Medicare Other | Source: Ambulatory Visit | Attending: Family Medicine | Admitting: Family Medicine

## 2021-03-20 DIAGNOSIS — H9192 Unspecified hearing loss, left ear: Secondary | ICD-10-CM | POA: Diagnosis not present

## 2021-03-20 DIAGNOSIS — J329 Chronic sinusitis, unspecified: Secondary | ICD-10-CM | POA: Diagnosis not present

## 2021-03-20 DIAGNOSIS — G319 Degenerative disease of nervous system, unspecified: Secondary | ICD-10-CM | POA: Diagnosis not present

## 2021-03-20 DIAGNOSIS — I6782 Cerebral ischemia: Secondary | ICD-10-CM | POA: Diagnosis not present

## 2021-03-20 MED ORDER — GADOBENATE DIMEGLUMINE 529 MG/ML IV SOLN
20.0000 mL | Freq: Once | INTRAVENOUS | Status: AC | PRN
  Administered 2021-03-20: 20 mL via INTRAVENOUS

## 2021-03-22 ENCOUNTER — Telehealth: Payer: Self-pay

## 2021-03-22 NOTE — Telephone Encounter (Signed)
Patients wife calls nurse line reporting patients MRI was WNL. Referral has been placed for ENT, however is see pending depending on MRI results. Patients wife reports a preferred apt for a Monday or Friday. Will route to referral coordinator.

## 2021-03-22 NOTE — Telephone Encounter (Signed)
Will check with MD to see if she feels patient still needs ENT referral.  Wife informed Dr. Pollie Friar office that they would call back if visit was still needed after MRI was performed.  Shereece Wellborn,CMA

## 2021-03-23 NOTE — Telephone Encounter (Signed)
Hi team, just FYI I have not been a part of this patient's care for the ENT referral and MRI. Must be a different provider.

## 2021-03-23 NOTE — Telephone Encounter (Signed)
Spoke to patient/wife and let them know the doctor wants them to see ENT.  They will call Dr. Pollie Friar office today to schedule the appt. Kendel Bessey,CMA

## 2021-03-23 NOTE — Telephone Encounter (Signed)
If you can look over the MRI results (which patient hasn't received yet) and just let me know if he still needs to see ENT.  The referral is in his chart but was put on hold until the results came back.  Thanks [employer]

## 2021-03-29 ENCOUNTER — Other Ambulatory Visit: Payer: Self-pay

## 2021-03-29 NOTE — Telephone Encounter (Signed)
Please could you forward this request to an attending? Pt's insurance does not accept residents approving his narcotic. Thank you.

## 2021-03-30 MED ORDER — HYDROCODONE-ACETAMINOPHEN 5-325 MG PO TABS: 1.0000 | ORAL_TABLET | Freq: Two times a day (BID) | ORAL | 0 refills | Status: DC | PRN

## 2021-03-30 NOTE — Telephone Encounter (Signed)
Thank you :)

## 2021-04-07 ENCOUNTER — Other Ambulatory Visit: Payer: Self-pay | Admitting: Family Medicine

## 2021-05-07 ENCOUNTER — Ambulatory Visit (INDEPENDENT_AMBULATORY_CARE_PROVIDER_SITE_OTHER): Payer: Medicare Other | Admitting: Otolaryngology

## 2021-05-07 ENCOUNTER — Other Ambulatory Visit: Payer: Self-pay

## 2021-05-07 DIAGNOSIS — H6123 Impacted cerumen, bilateral: Secondary | ICD-10-CM

## 2021-05-07 DIAGNOSIS — J342 Deviated nasal septum: Secondary | ICD-10-CM | POA: Diagnosis not present

## 2021-05-07 NOTE — Progress Notes (Signed)
HPI: Corey AMJAD Sr. is a 65 y.o. male who presents is referred by Dr. Andria Frames for evaluation of recent severe left earache and temporary loss of hearing.  This occurred about a month ago.  He apparently had a bad left earache and temporarily lost his hearing.  He is doing much better today.  He is having no earache today and feels like he hears relatively well.  He is not complaining of any hearing problems..  Past Medical History:  Diagnosis Date   Allergy    Allergic rhinitis   Angina pectoris ([place]) 2006   Known coronary vasospasm -- treated with combination of long-acting nitrates and calcium channel blockers; NTG when necessary   CAD (coronary artery disease) 04/2005   Mild obstructive ramus intermedius disease (30-50%) and 50-60% proximal   Chronic back pain    Stemming from prior lumbar diskectomy   GERD (gastroesophageal reflux disease)    Hyperlipidemia    Hypertension    Obesity    Substance abuse ([place])    Known chronic alcoholic - not interested in quitting (current as of 2014)   Past Surgical History:  Procedure Laterality Date   [place], 1997   Chronic back pain stemming from this   [place]  2005   Dr. Daneen Schick -- Cardiolite stress test showed no evidence of ischemia   COLONOSCOPY W/ BIOPSIES  2011   Multiple small polyps and s/p polypectomy -- recommended repeat in 2 years due to multiple small polyps   ESOPHAGEAL DILATION  1997   Social History   Socioeconomic History   Marital status: Married    Spouse name: Hoyle Sauer   Number of children: 2   Years of education: 8   Highest education level: 8th grade  Occupational History   Occupation: Disabled   Tobacco Use   Smoking status: Former    Packs/day: 2.00    Types: Cigarettes   Smokeless tobacco: Former    Quit date: 1991   Tobacco comments:    2PPD smoker until 1991  Vaping Use   Vaping Use: Never used  Substance and Sexual Activity   Alcohol use: Yes    Alcohol/week:  3.0 standard drinks    Types: 3 Cans of beer per week   Drug use: Not Currently   Sexual activity: Yes    Birth control/protection: Post-menopausal, Surgical  Other Topics Concern   Not on file  Social History Narrative   Patient lives with his wife Hoyle Sauer.    Patient has 2 adult children, with 6 grandchildren, 1 great grandchild.    Patients family lives all on the same street.    Patient enjoys being outside, fixing up his home, and spending time with his family.    Social Determinants of Health   Financial Resource Strain: Low Risk    Difficulty of Paying Living Expenses: Not hard at all  Food Insecurity: No Food Insecurity   Worried About Charity fundraiser in the Last Year: Never true   [place] in the Last Year: Never true  Transportation Needs: No Transportation Needs   Lack of Transportation (Medical): No   Lack of Transportation (Non-Medical): No  Physical Activity: Inactive   Days of Exercise per Week: 0 days   Minutes of Exercise per Session: 0 min  Stress: No Stress Concern Present   Feeling of Stress : Only a little  Social Connections: Moderately Isolated   Frequency of Communication with Friends and Family: More  than three times a week   Frequency of Social Gatherings with Friends and Family: More than three times a week   Attends Religious Services: Never   Marine scientist or Organizations: No   Attends Music therapist: Never   Marital Status: Married   Family History  Problem Relation Age of Onset   Heart failure Mother    Heart failure Father    Heart disease Father    Allergies  Allergen Reactions   Diphenhydramine Hcl    Zantac [Ranitidine Hcl]    Prior to Admission medications   Medication Sig Start Date End Date Taking? Authorizing Provider  allopurinol (ZYLOPRIM) 100 MG tablet TAKE 1 TABLET(100 MG) BY MOUTH DAILY 10/30/20   Lattie Haw, MD  amitriptyline (ELAVIL) 50 MG tablet TAKE 1 TABLET(50 MG) BY MOUTH AT  BEDTIME AS NEEDED FOR SLEEP 06/14/19   Kathrene Alu, MD  amLODipine (NORVASC) 10 MG tablet TAKE 1 TABLET(10 MG) BY MOUTH DAILY 04/09/21   Lattie Haw, MD  aspirin 81 MG EC tablet Take 81 mg by mouth daily.    [provider]  busPIRone (BUSPAR) 30 MG tablet Take 1 tablet (30 mg total) by mouth 2 (two) times daily. 09/28/20   Lattie Haw, MD  cyclobenzaprine (FLEXERIL) 10 MG tablet Take 1 tablet (10 mg total) by mouth 3 (three) times daily as needed for muscle spasms. 12/29/19   Kathrene Alu, MD  diclofenac Sodium (VOLTAREN) 1 % GEL Apply 4 g topically 4 (four) times daily. 03/22/20   Carollee Leitz, MD  gabapentin (NEURONTIN) 300 MG capsule TAKE 1 CAPSULE(300 MG) BY MOUTH THREE TIMES DAILY 01/26/21   Gladys Damme, MD  HYDROcodone-acetaminophen (NORCO/VICODIN) 5-325 MG tablet Take 1 tablet by mouth 2 (two) times daily as needed. Needs appointment before next refill. 03/30/21   McDiarmid, Blane Ohara, MD  ibuprofen (ADVIL) 800 MG tablet Take 1 tablet (800 mg total) by mouth every 8 (eight) hours as needed. 11/26/19   Kathrene Alu, MD  isosorbide mononitrate (IMDUR) 60 MG 24 hr tablet Take 1 tablet (60 mg total) by mouth daily. 06/15/20   Lattie Haw, MD  metoprolol succinate (TOPROL-XL) 50 MG 24 hr tablet Take 1 tablet (50 mg total) by mouth at bedtime. Take with or immediately following a meal. 06/15/20   Lattie Haw, MD  montelukast (SINGULAIR) 10 MG tablet TAKE 1 TABLET(10 MG) BY MOUTH AT BEDTIME 04/09/21   Lattie Haw, MD  mupirocin ointment (BACTROBAN) 2 % Apply 1 application topically 2 (two) times daily. 02/09/21   Alcus Dad, MD  nitroGLYCERIN (NITROSTAT) 0.4 MG SL tablet PLACE 1 TABLET UNDER THE TONGUE IF NEEDED FOR CHEST PAIN EVERY 5 MINUTES FOR 3 DOSES. IF NO RELIEF AFTER 3RD DOSE CALL MD OR 911 03/30/18   Alveda Reasons, MD  omeprazole (PRILOSEC) 20 MG capsule TAKE 1 CAPSULE(20 MG) BY MOUTH DAILY 03/13/21   Lattie Haw, MD  pravastatin (PRAVACHOL) 40 MG tablet TAKE  1 TABLET(40 MG) BY MOUTH DAILY 08/17/20   Lattie Haw, MD     Positive ROS: Otherwise negative.  Does complain of difficulty breathing for the left side of his nose.  All other systems have been reviewed and were otherwise negative with the exception of those mentioned in the HPI and as above.  Physical Exam: Constitutional: Alert, well-appearing, no acute distress Ears: External ears without lesions or tenderness.  He has small ear canals bilaterally.  He had a mod amount of wax buildup in both ear  canals worse on the left side.  This was cleaned with suction.  TMs were clear bilaterally.  On hearing screening with the 512 [id number] tuning fork he heard relatively well in both ears with perhaps a mild symmetric hearing loss in the upper frequencies.  Weber was midline.  AC > BC bilaterally. Nasal: External nose without lesions. Septum is deviated to the left with a very narrowed left nasal passageway compared to the right.  No intranasal polyps noted in both middle meatus regions were clear..  Oral: Lips and gums without lesions. Tongue and palate mucosa without lesions. Posterior oropharynx clear. Neck: No palpable adenopathy or masses Respiratory: Breathing comfortably  Skin: No facial/neck lesions or rash noted.  Procedures  Assessment: Questionable etiology of the ear pain and temporary hearing loss.  He is doing well today he did have moderate wax buildup in both ear canals that was cleaned and the TMs were clear bilaterally.  He is not complaining of any hearing problems today.  Plan: Briefly discussed with him concerning obtaining audiogram but is not interested to have an audiogram performed today.  Ear canals were cleaned and TMs were clear bilaterally.  On hearing screening with a tuning forks he hears relatively well in both ears.   Radene Journey, MD   CC:

## 2021-05-28 ENCOUNTER — Other Ambulatory Visit: Payer: Self-pay

## 2021-05-28 MED ORDER — HYDROCODONE-ACETAMINOPHEN 5-325 MG PO TABS: 1.0000 | ORAL_TABLET | Freq: Two times a day (BID) | ORAL | 0 refills | Status: DC | PRN

## 2021-05-28 NOTE — Telephone Encounter (Signed)
I will refill.  And patients receiving controled substances need to be seen by PCP at least every three months to document the need.  Last seen by dr. Posey Pronto 09/2020.  Please let patient know he needs appointment with PCP before any additional refills (beyond the current one.)

## 2021-05-28 NOTE — Telephone Encounter (Signed)
Corey Watts please could you forward this request to a preceptor? This pt's insurance does not allow residents to refill his narcotics. Thank you.

## 2021-05-29 NOTE — Telephone Encounter (Signed)
Yes she was I am grateful

## 2021-05-29 NOTE — Telephone Encounter (Signed)
Corey Watts please could you schedule this pt with another provider for this Friday? I have discussed this with Dr Corey Watts and Dr Corey Watts today and we are in the process of transferring Corey Watts to have another provider (probably Corey Watts) as his PCP. Thank you!

## 2021-05-30 ENCOUNTER — Encounter: Payer: Self-pay | Admitting: Family Medicine

## 2021-05-30 ENCOUNTER — Telehealth: Payer: Self-pay | Admitting: Family Medicine

## 2021-05-30 NOTE — Telephone Encounter (Signed)
Multiple call attempts made. Callback message left.  PCP requested a switch to a different provider.   Whenever he calls back, please connect him with me to discuss and schedule him with his new PCP - Dr. Joelyn Oms.   Thanks.

## 2021-05-31 NOTE — Telephone Encounter (Signed)
Thank you Dr Gwendlyn Deutscher!

## 2021-05-31 NOTE — Telephone Encounter (Signed)
Letter mailed to the physical address on file regarding PCP switch since I have been unable to reach him by phone.

## 2021-05-31 NOTE — Telephone Encounter (Signed)
Patient first called stating he was confirming an appointment for tomorrow Friday 06/01/09 at [facility] patient we do not have him scheduled and she stated she spoke to a nurse who set it up. Asked patient was appointment was for and they didn't know what the reason for the appt was.   Saw notes were PCP has been recently changed and patient needs appt with PCP in order to get future refills. Offered appt with new PCP and they declined at this time since [facility] had no Friday morning appts. Patient said they will call back to schedule this appt.

## 2021-06-06 ENCOUNTER — Other Ambulatory Visit: Payer: Self-pay | Admitting: Family Medicine

## 2021-07-20 ENCOUNTER — Ambulatory Visit (INDEPENDENT_AMBULATORY_CARE_PROVIDER_SITE_OTHER): Payer: Medicare Other | Admitting: Student

## 2021-07-20 ENCOUNTER — Encounter: Payer: Self-pay | Admitting: Student

## 2021-07-20 ENCOUNTER — Other Ambulatory Visit: Payer: Self-pay

## 2021-07-20 VITALS — BP 142/90 | HR 80 | Wt 220.0 lb

## 2021-07-20 DIAGNOSIS — Z0000 Encounter for general adult medical examination without abnormal findings: Secondary | ICD-10-CM

## 2021-07-20 DIAGNOSIS — F1190 Opioid use, unspecified, uncomplicated: Secondary | ICD-10-CM | POA: Diagnosis not present

## 2021-07-20 MED ORDER — HYDROCODONE-ACETAMINOPHEN 5-325 MG PO TABS: 1.0000 | ORAL_TABLET | Freq: Two times a day (BID) | ORAL | 0 refills | Status: DC | PRN

## 2021-07-20 MED ORDER — NALOXONE HCL 4 MG/0.1ML NA LIQD: NASAL | 0 refills | Status: AC

## 2021-07-20 NOTE — Assessment & Plan Note (Signed)
PDMP reviewed and appropriate. Educated regarding Narcan. Patient willing to provide UDS sample today. Patient has had issues in the past with insurance denying narcotic scripts written by residents, discussed with Dr. Owens Shark who will refill prescription. - Refill Norco - UDS today - Narcan prescription sent to pharmacy

## 2021-07-20 NOTE — Assessment & Plan Note (Signed)
Patient refuses flu, Shingrex, and pneumonia vaccines today. Will revisit at follow-up.

## 2021-07-20 NOTE — Progress Notes (Signed)
    SUBJECTIVE:   CHIEF COMPLAINT / HPI:   Establish Care Chronic Back Pain Chronic Opioid Use Patient presents to the clinic today to establish care with me as his new PCP.  He is also requesting a refill of his Norco.  He has been on Norco since a back injury and subsequent surgeries in 1989.  He says that he does not take it every day but only as needed. He says that his pain is currently a bit worse than usual, but that he needs it more this time of year as the cold weather aggravates his back pain.  He says that sometimes in the summer he can go up to four months without needing narcotics. He takes ibuprofen and flexeril regularly but finds that Norco is the only thing that works when the pain becomes especially severe. He saw sports medicine earlier this year and was given exercises but did not find these to be helpful. He does work out regularly and has not noticed much of a difference in his symptoms. He denies any drowsiness, brain fog, or constipation. No urinary retention, bowel or bladder incontinence, saddle anesthesia. His last dose of Norco was one day ago.   Health Maintenance: Patient declines flu, shingrex, and PNA vaccines at this time.   PERTINENT  PMH / PSH: CAD, HTN, s/p lumbar back surgery (1989, 1991)  OBJECTIVE:   BP (!) 142/90   Pulse 80   Wt 220 lb (99.8 kg)   SpO2 93%   BMI 29.84 kg/m   Physical Exam Vitals reviewed.  Constitutional:      General: He is not in acute distress. Cardiovascular:     Rate and Rhythm: Normal rate and regular rhythm.     Heart sounds: Normal heart sounds.  Pulmonary:     Effort: Pulmonary effort is normal.     Breath sounds: Normal breath sounds.  Abdominal:     General: There is no distension.     Tenderness: There is no abdominal tenderness.  Musculoskeletal:     Lumbar back: Tenderness and bony tenderness present. No swelling, edema or deformity.     Comments: Lumbar spine with decreased ROM in flexion and extension   Skin:    General: Skin is warm and dry.  Neurological:     Sensory: No sensory deficit.     Motor: No weakness.     Gait: Gait normal.     ASSESSMENT/PLAN:   Chronic, continuous use of opioids PDMP reviewed and appropriate. Educated regarding Narcan. Patient willing to provide UDS sample today. Patient has had issues in the past with insurance denying narcotic scripts written by residents, discussed with Dr. Owens Shark who will refill prescription. - Refill Norco - UDS today - Narcan prescription sent to pharmacy  Healthcare maintenance Patient refuses flu, Shingrex, and pneumonia vaccines today. Will revisit at follow-up.     Pearla Dubonnet, MD [place]

## 2021-07-20 NOTE — Patient Instructions (Addendum)
Mr. Helvey,  It is so nice to meet you today. Here's a recap of what we talked about today. -I am sending a refill of your Norco to your pharmacy.  I am also sending in a prescription for Narcan which is an opioid reversal agent used in case of accidental opioid overdose.  The pharmacist will discuss with you how to use this in the case of emergency. -We regularly check urine drug screens on all of her chronic opioid patients, we will collect a sample from you today.  I will call you if there are any concerns with your results..  Please do not hesitate to make an appointment and come back to see me if anything comes up.  Dr. Joelyn Oms

## 2021-07-27 LAB — TOXASSURE SELECT 13 (MW), URINE

## 2021-08-05 ENCOUNTER — Other Ambulatory Visit: Payer: Self-pay | Admitting: Family Medicine

## 2021-08-19 IMAGING — MR MR HEAD WO/W CM
11 of 12 series · 38 of 48 positions shown · IV contrast (multihance)
Comparison: No pertinent prior exams available for comparison.

CLINICAL DATA: Acute hearing loss of left ear. Neuro deficit,
acute, stroke suspected. Additional history provided by scanning
technologist: Patient reports acute hearing loss in left ear,
symptoms for 1 week.

EXAM:
MRI HEAD WITHOUT AND WITH CONTRAST
TECHNIQUE: Multiplanar, multiecho pulse sequences of the brain and surrounding
structures were obtained without and with intravenous contrast.
CONTRAST:  20mL MULTIHANCE GADOBENATE DIMEGLUMINE 529 MG/ML IV SOLN

[Series 2: T1 · sagittal · 5.0mm · 0.45mm/px · 4 of 22 slices shown (1 of 3)]
[im 1/22]
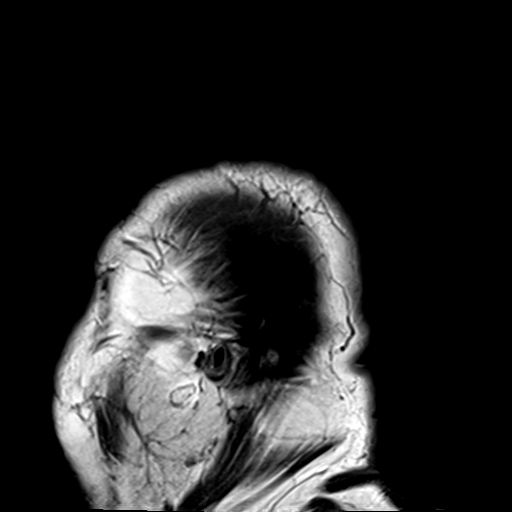
[im 8/22]
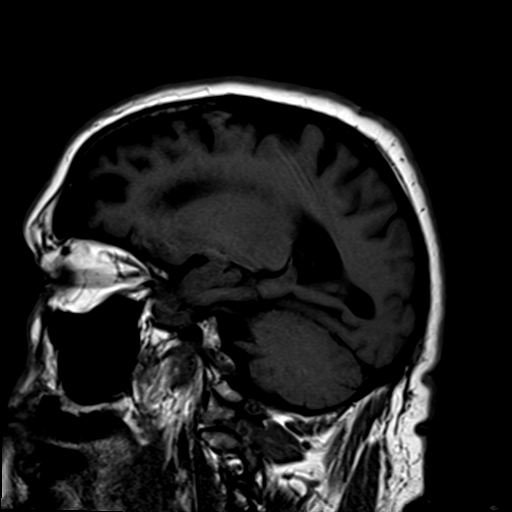
[im 15/22]
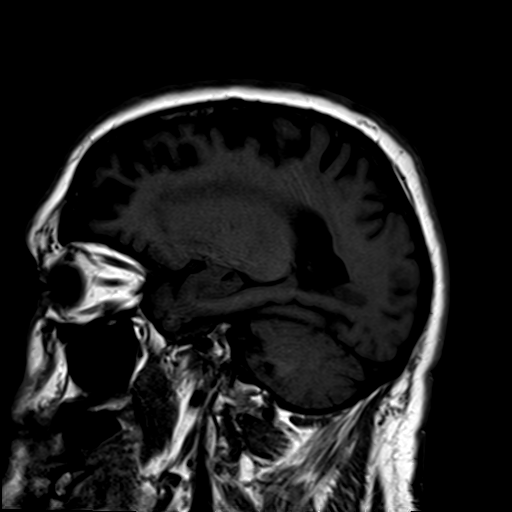
[im 22/22]
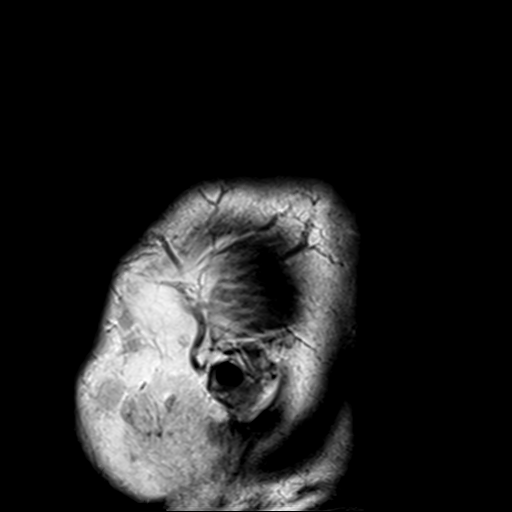

[Series 3: DWI · axial · 3.0mm · 1.80mm/px · z∈[-45,+96]mm · 11 of 100 slices shown (1 of 2)]
[im 1/100]
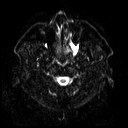
[im 10/100]
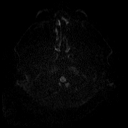
[im 20/100]
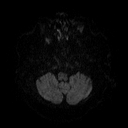
[im 30/100]
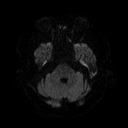
[im 40/100]
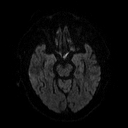
[im 50/100]
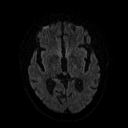
[im 60/100]
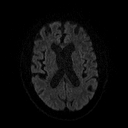
[im 70/100]
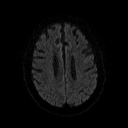
[im 80/100]
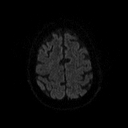
[im 90/100]
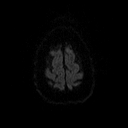
[im 100/100]
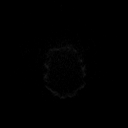

[Series 4: DWI · axial · 3.0mm · 1.80mm/px · z∈[-45,+96]mm · 6 of 50 slices shown (2 of 2)]
[im 1/50]
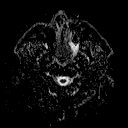
[im 10/50]
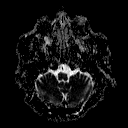
[im 20/50]
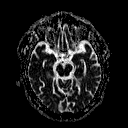
[im 30/50]
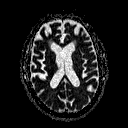
[im 40/50]
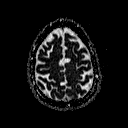
[im 50/50]
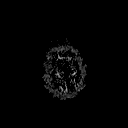

[Series 5: T2 · axial · 5.0mm · 0.45mm/px · z∈[-42,+95]mm · 3 of 23 slices shown]
[im 1/23]
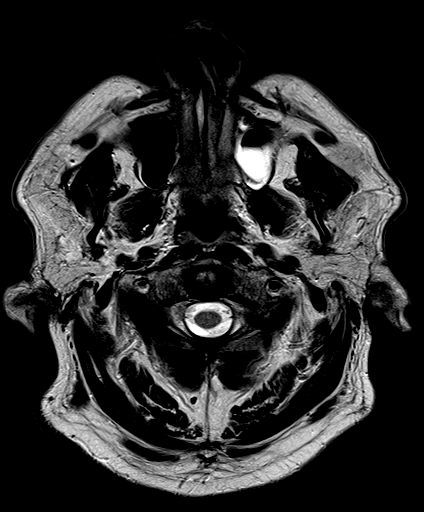
[im 12/23]
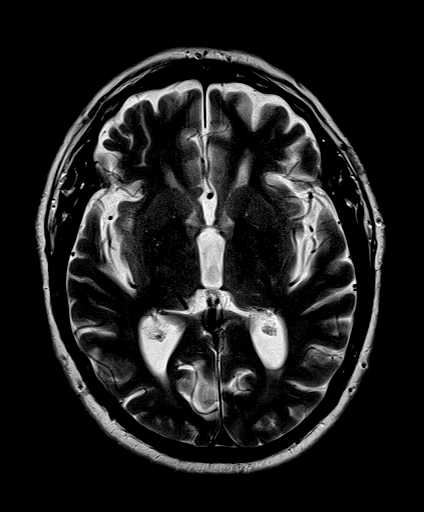
[im 23/23]
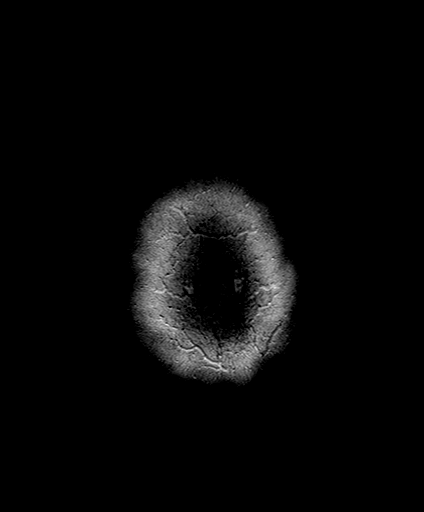

[Series 6: FLAIR · axial · 3.0mm · 0.45mm/px · z∈[-50,+100]mm · 3 of 27 slices shown]
[im 1/27]
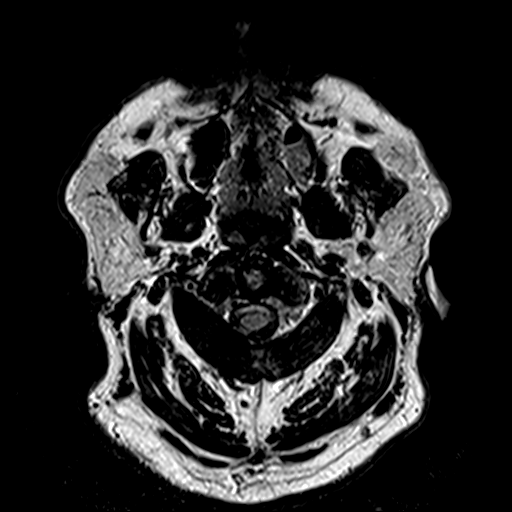
[im 14/27]
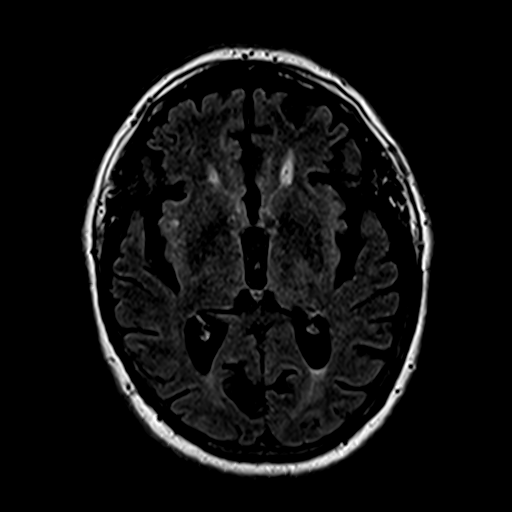
[im 27/27]
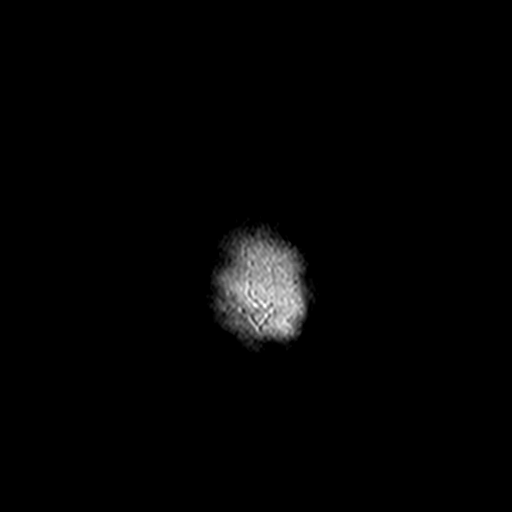

[Series 8: swi_images · axial · 3.0mm · 0.90mm/px · z∈[-41,+94]mm · 5 of 48 slices shown]
[im 1/48]
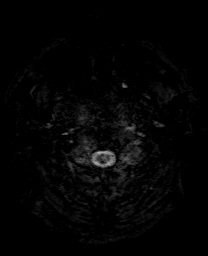
[im 12/48]
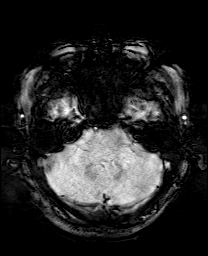
[im 24/48]
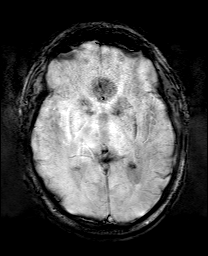
[im 36/48]
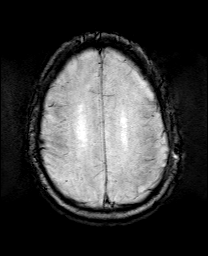
[im 48/48]
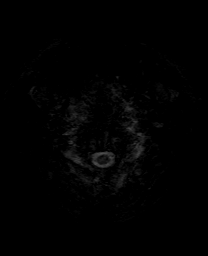

[Series 9: T1 · coronal · 3.0mm · 0.35mm/px · 1 of 11 slices shown (2 of 3)]
[im 1/11]
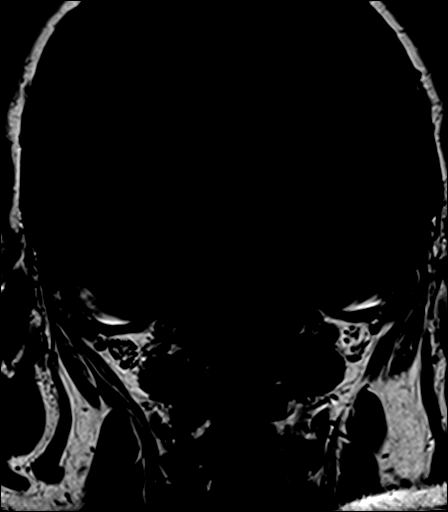

[Series 10: T1 · axial · 3.0mm · 0.35mm/px · 1 of 11 slices shown (3 of 3)]
[im 1/11]
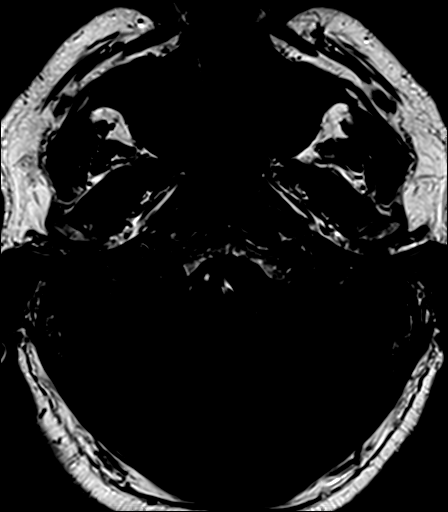

[Series 11: bSSFP · axial · 1.0mm · 0.28mm/px · z∈[-48,-37]mm · 2 of 36 slices shown]
[im 1/36]
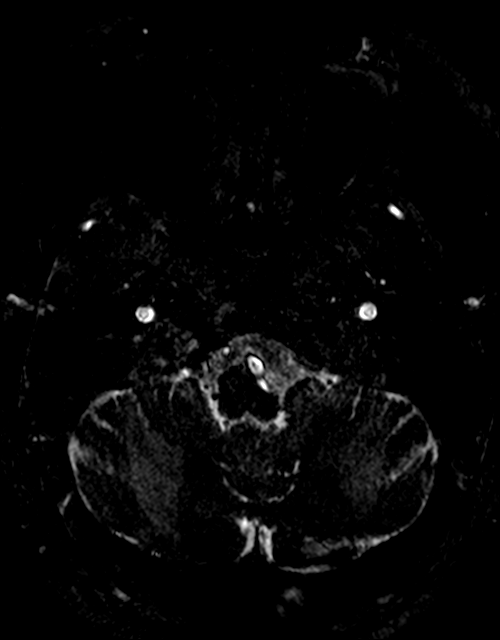
[im 12/36]
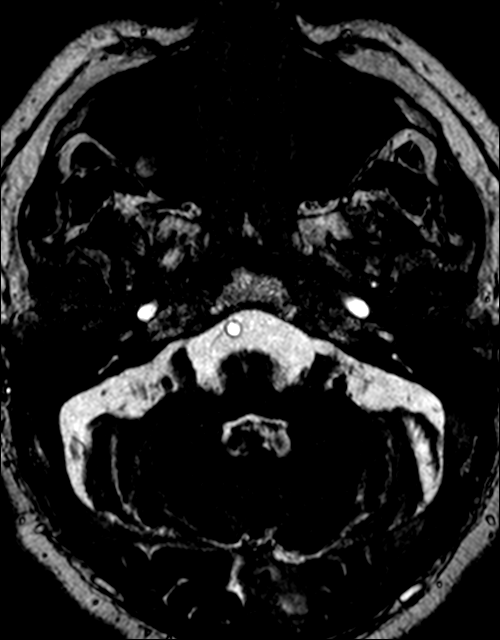

[Series 12: T1 post-contrast · coronal · 3.0mm · 0.35mm/px · 1 of 11 slices shown (1 of 2)]
[im 1/11]
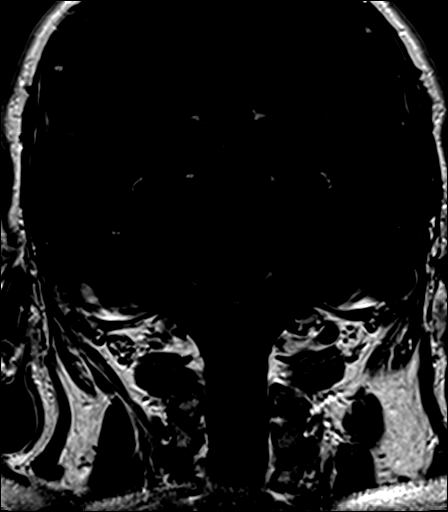

[Series 13: T1 post-contrast · axial · 3.0mm · 0.35mm/px · 1 of 11 slices shown (2 of 2)]
[im 1/11]
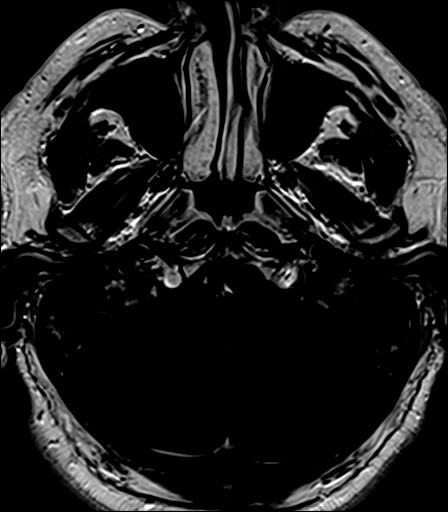

[38 of 48 positions shown; findings below may reference images not displayed]

FINDINGS: Brain:

Mild frontal predominant cerebral atrophy.

Mild multifocal T2/FLAIR hyperintensity within the cerebral white
matter is nonspecific, but compatible with chronic small vessel
ischemic disease.

No evidence of an intracranial mass. Specifically, no
cerebellopontine angle or internal auditory canal mass is
demonstrated. Unremarkable appearance of the seventh and eighth
cranial nerves bilaterally.

There is no acute infarct.

No chronic intracranial blood products.

No extra-axial fluid collection.

No midline shift.

No abnormal intracranial enhancement.

Vascular: No focal marrow lesion.

Skull and upper cervical spine: No focal suspicious marrow lesion.
Trace C2-C3 grade 1 anterolisthesis.

Sinuses/Orbits: Visualized orbits show no acute finding. Left lens
replacement. Opacification of posterior ethmoid air cells
bilaterally. Small right maxillary sinus mucous retention cyst.
Moderate-sized mucous retention cyst and mild mucosal thickening
within the left maxillary sinus.
IMPRESSION: No evidence of acute intracranial abnormality.

No cerebellopontine angle or internal auditory canal mass.

Mild chronic small vessel ischemic changes within the cerebral white
matter.

Mild frontal predominant cerebral atrophy.

Paranasal sinus disease, as described.

## 2021-08-21 ENCOUNTER — Encounter: Payer: Self-pay | Admitting: Family Medicine

## 2021-08-21 ENCOUNTER — Other Ambulatory Visit: Payer: Self-pay

## 2021-08-21 ENCOUNTER — Ambulatory Visit (INDEPENDENT_AMBULATORY_CARE_PROVIDER_SITE_OTHER): Payer: Medicare Other | Admitting: Family Medicine

## 2021-08-21 VITALS — BP 160/78 | HR 68 | Wt 221.6 lb

## 2021-08-21 DIAGNOSIS — M791 Myalgia, unspecified site: Secondary | ICD-10-CM

## 2021-08-21 DIAGNOSIS — R051 Acute cough: Secondary | ICD-10-CM | POA: Diagnosis not present

## 2021-08-21 DIAGNOSIS — R062 Wheezing: Secondary | ICD-10-CM | POA: Insufficient documentation

## 2021-08-21 LAB — POCT INFLUENZA A/B
Influenza A, POC: POSITIVE — AB
Influenza B, POC: NEGATIVE

## 2021-08-21 MED ORDER — BENZONATATE 100 MG PO CAPS
100.0000 mg | ORAL_CAPSULE | Freq: Two times a day (BID) | ORAL | 0 refills | Status: DC | PRN
Start: 2021-08-21 — End: 2022-01-24

## 2021-08-21 MED ORDER — ALBUTEROL SULFATE HFA 108 (90 BASE) MCG/ACT IN AERS: 2.0000 | INHALATION_SPRAY | RESPIRATORY_TRACT | 0 refills | Status: DC | PRN

## 2021-08-21 NOTE — Assessment & Plan Note (Signed)
Patient symptoms of wheezing, cough and shortness of breath with myalgias likely due to viral infection.  Will test for influenza. Will prescribe Tessalon Perles for cough as well as discuss honey and warm fluids Patient advised to seek care in the ED if he experiences worsening shortness of breath or inability to hydrate orally. Prescribed albuterol inhaler

## 2021-08-21 NOTE — Progress Notes (Signed)
    SUBJECTIVE:   CHIEF COMPLAINT / HPI:   Patient reports that he has been experiencing cough cough and wheezing that is nonproductive along with wheezing and shortness of breath for 5 days.  He reports that he recently gathered with family as well as a person at work who was ill or tested positive for influenza.  Patient reports that his symptoms worsened last night as he had difficulty sleeping due to shortness of breath.  He denies any GI upset.  Patient reports that cough is mild but persistent.  He reports some fevers and chills that have resolved since onset of symptoms.  He reports having body aches.  Patient has tried several over-the-counter medications without any relief in his symptoms.  He denies any ear pain.  Patient reports that he did have sore throat.  Patient denies smoking in the last 36 years.  He denies history of COPD or asthma.  PERTINENT  PMH / PSH:  HTN   OBJECTIVE:   BP (!) 160/78   Pulse 68   Wt 221 lb 9.6 oz (100.5 kg)   SpO2 95%   BMI 30.05 kg/m   Physical Exam Constitutional:      General: He is not in acute distress.    Appearance: Normal appearance. He is not ill-appearing, toxic-appearing or diaphoretic.  HENT:     Right Ear: External ear normal. There is no impacted cerumen.     Left Ear: External ear normal. There is no impacted cerumen.     Mouth/Throat:     Mouth: Mucous membranes are moist.     Pharynx: No oropharyngeal exudate or posterior oropharyngeal erythema.  Eyes:     General: No scleral icterus.    Extraocular Movements: Extraocular movements intact.     Conjunctiva/sclera: Conjunctivae normal.  Cardiovascular:     Rate and Rhythm: Normal rate and regular rhythm.     Pulses: Normal pulses.     Heart sounds: Normal heart sounds. No murmur heard. Pulmonary:     Effort: Pulmonary effort is normal. No respiratory distress.     Breath sounds: No stridor. Wheezing present. No rhonchi or rales.  Abdominal:     General: Bowel sounds are  normal. There is no distension.     Tenderness: There is no abdominal tenderness.  Musculoskeletal:        General: No swelling.  Skin:    Capillary Refill: Capillary refill takes less than 2 seconds.     Findings: No erythema or rash.  Neurological:     General: No focal deficit present.     Mental Status: He is alert and oriented to person, place, and time.    ASSESSMENT/PLAN:   Wheezing Patient symptoms of wheezing, cough and shortness of breath with myalgias likely due to viral infection.  Will test for influenza. Will prescribe Tessalon Perles for cough as well as discuss honey and warm fluids Patient advised to seek care in the ED if he experiences worsening shortness of breath or inability to hydrate orally. Prescribed albuterol inhaler      Eulis Foster, MD [place]

## 2021-08-21 NOTE — Patient Instructions (Addendum)
We will test you for influenza.  I will notify you of results are positive.  I recommend that you drink warm fluids with 1 teaspoon of honey to help with the cough.  I have also sent a prescription to help with your cough as well as albuterol inhaler to help with your wheezing.  I recommended GUN anyway share household with frequently disinfect commonly touched surfaces such as a kitchen and restroom.  Also recommended she wash hands frequently throughout the day to prevent spread of any potential viral infection.  Please seek care at the ED if you have increasing difficulty breathing or unable to tolerate oral fluids to maintain hydration.

## 2021-08-22 ENCOUNTER — Ambulatory Visit: Payer: Medicare Other | Admitting: Family Medicine

## 2021-08-22 NOTE — Progress Notes (Deleted)
    SUBJECTIVE:   CHIEF COMPLAINT / HPI: cough, wheezing  Seen yesterday for wheezing and cough. Tested positive for influenza A. Prescribed albuterol inhaler and benzonatate. No prior history of asthma or COPD.  PERTINENT  PMH / PSH: allergic rhinitis, GERD   OBJECTIVE:   There were no vitals taken for this visit.  General: ***, NAD CV: RRR, no murmurs*** Pulm: CTAB, no wheezes or rales  ASSESSMENT/PLAN:   No problem-specific Assessment & Plan notes found for this encounter.     Zola Button, MD [place]   {    This will disappear when note is signed, click to select method of visit    :1}

## 2021-08-22 NOTE — Patient Instructions (Incomplete)
It was nice seeing you today! ° ° ° °Please arrive at least 15 minutes prior to your scheduled appointments. ° °Stay well, °Elgin Carn, MD °[facility] °[phone]  °

## 2021-08-23 ENCOUNTER — Ambulatory Visit: Payer: Medicare Other

## 2021-08-23 ENCOUNTER — Encounter: Payer: Self-pay | Admitting: Family Medicine

## 2021-09-26 ENCOUNTER — Other Ambulatory Visit: Payer: Self-pay | Admitting: Family Medicine

## 2021-09-28 ENCOUNTER — Other Ambulatory Visit: Payer: Self-pay

## 2021-10-01 ENCOUNTER — Other Ambulatory Visit: Payer: Self-pay

## 2021-10-01 MED ORDER — HYDROCODONE-ACETAMINOPHEN 5-325 MG PO TABS: 1.0000 | ORAL_TABLET | Freq: Two times a day (BID) | ORAL | 0 refills | Status: DC | PRN

## 2021-10-01 MED ORDER — NITROGLYCERIN 0.4 MG SL SUBL: SUBLINGUAL_TABLET | SUBLINGUAL | 0 refills | Status: DC

## 2021-10-01 MED ORDER — CYCLOBENZAPRINE HCL 10 MG PO TABS: 10.0000 mg | ORAL_TABLET | Freq: Three times a day (TID) | ORAL | 0 refills | Status: DC | PRN

## 2021-11-05 ENCOUNTER — Other Ambulatory Visit: Payer: Self-pay | Admitting: Family Medicine

## 2021-12-04 ENCOUNTER — Ambulatory Visit (INDEPENDENT_AMBULATORY_CARE_PROVIDER_SITE_OTHER): Payer: Medicare Other | Admitting: Student

## 2021-12-04 ENCOUNTER — Other Ambulatory Visit: Payer: Self-pay

## 2021-12-04 ENCOUNTER — Encounter: Payer: Self-pay | Admitting: Student

## 2021-12-04 DIAGNOSIS — M7740 Metatarsalgia, unspecified foot: Secondary | ICD-10-CM | POA: Insufficient documentation

## 2021-12-04 DIAGNOSIS — M7741 Metatarsalgia, right foot: Secondary | ICD-10-CM

## 2021-12-04 DIAGNOSIS — M7742 Metatarsalgia, left foot: Secondary | ICD-10-CM | POA: Diagnosis not present

## 2021-12-04 DIAGNOSIS — E781 Pure hyperglyceridemia: Secondary | ICD-10-CM

## 2021-12-04 LAB — POCT GLYCOSYLATED HEMOGLOBIN (HGB A1C): Hemoglobin A1C: 6.2 % — AB (ref 4.0–5.6)

## 2021-12-04 NOTE — Progress Notes (Signed)
? ? ?  SUBJECTIVE:  ? ?CHIEF COMPLAINT / HPI:  ? ?Peripheral Neuropathy ?Takes gabapentin 300 mg twice daily instead of TID, amitriptyline 50 mg at bedtime. Does not feel like it is helping. Last A1c was 5.8 in January of last year and today it is 6.2 in the prediabetic range. For the last 3 weeks he feels that his foot pain is much worse when walking. He attests to wearing supportive shoes like [employer]. Takes one a day vitamins. Per chart review neuropathy thought to be idiopathic or related to alcohol use. Says in the past he has had to have B12 shots. Says he takes Vicodin 2 times a week for his back pain.  ? ?PERTINENT  PMH / PSH: chronic back pain, gout, raynauds ? ?OBJECTIVE:  ? ?BP 135/70   Pulse 70   Ht 6' (1.829 m)   Wt 220 lb (99.8 kg)   SpO2 93%   BMI 29.84 kg/m?   ?General: Well appearing, NAD, awake, alert, responsive to questions ?Respiratory: no increased work of breathing  ?Extremities: Moves upper and lower extremities freely, no edema in LE, bilateral feet with no sensation on palmar surface, brisk capillary refill, 2+ pulses, pain on palpation on arch of foot and metatarsal bases bilaterally ? ?ASSESSMENT/PLAN:  ? ?Metatarsalgia ?Did not have any swollen joints/signs of gout. Had good perfusion throughout. Given pain on palpation of bases of metatarsals most likely metatarsalgia. Referred to sports medicine for evaluation and potential inserts.  ? ?Neuropathy ?- CBC ?- Comprehensive metabolic panel ?- TSH ?- Vitamin B12 ?- HgB A1c (pre diabetic-6.2) ?-continue gabapentin, amitriptyline, advised capsaicin ? ?Hypertriglyceridemia ? - Lipid Panel; Future ? ? ?Corey Heck, MD ?[place]  ?

## 2021-12-04 NOTE — Assessment & Plan Note (Addendum)
Did not have any swollen joints/signs of gout. Had good perfusion throughout. Given pain on palpation of bases of metatarsals most likely metatarsalgia. Referred to sports medicine for evaluation and potential inserts.  ?

## 2021-12-04 NOTE — Patient Instructions (Signed)
It was great to see you! Thank you for allowing me to participate in your care!  ? ?I recommend that you always bring your medications to each appointment as this makes it easy to ensure we are on the correct medications and helps [place] not miss when refills are needed. ? ?Our plans for today:  ?-Continue your gabapentin and amitriptyline for your neuropathy.  You can consider capsaicin cream to help as well. ?-I am referring you to sports medicine for your metatarsal pain ? ?We are checking some labs today, I will call you if they are abnormal will send you a MyChart message or a letter if they are normal.  If you do not hear about your labs in the next 2 weeks please let us know. ? ?Take care and seek immediate care sooner if you develop any concerns. Please remember to show up 15 minutes before your scheduled appointment time! ? ?Gerrit Heck, MD ?[facility]  ?

## 2021-12-05 ENCOUNTER — Telehealth: Payer: Self-pay | Admitting: Student

## 2021-12-05 LAB — COMPREHENSIVE METABOLIC PANEL
ALT: 21 IU/L (ref 0–44)
AST: 18 IU/L (ref 0–40)
Albumin/Globulin Ratio: 1.6 (ref 1.2–2.2)
Albumin: 4.9 g/dL — ABNORMAL HIGH (ref 3.8–4.8)
Alkaline Phosphatase: 71 IU/L (ref 44–121)
BUN/Creatinine Ratio: 11 (ref 10–24)
BUN: 11 mg/dL (ref 8–27)
Bilirubin Total: 0.3 mg/dL (ref 0.0–1.2)
CO2: 25 mmol/L (ref 20–29)
Calcium: 9.9 mg/dL (ref 8.6–10.2)
Chloride: 101 mmol/L (ref 96–106)
Creatinine, Ser: 0.98 mg/dL (ref 0.76–1.27)
Globulin, Total: 3 g/dL (ref 1.5–4.5)
Glucose: 83 mg/dL (ref 70–99)
Potassium: 4.7 mmol/L (ref 3.5–5.2)
Sodium: 143 mmol/L (ref 134–144)
Total Protein: 7.9 g/dL (ref 6.0–8.5)
eGFR: 85 mL/min/{1.73_m2} (ref >59)

## 2021-12-05 LAB — VITAMIN B12: Vitamin B-12: 417 pg/mL (ref 232–1245)

## 2021-12-05 LAB — CBC
Hematocrit: 43.6 % (ref 37.5–51.0)
Hemoglobin: 15.3 g/dL (ref 13.0–17.7)
MCH: 33.5 pg — ABNORMAL HIGH (ref 26.6–33.0)
MCHC: 35.1 g/dL (ref 31.5–35.7)
MCV: 95 fL (ref 79–97)
Platelets: 213 10*3/uL (ref 150–450)
RBC: 4.57 x10E6/uL (ref 4.14–5.80)
RDW: 13.4 % (ref 11.6–15.4)
WBC: 5.6 10*3/uL (ref 3.4–10.8)

## 2021-12-05 LAB — TSH: TSH: 3.1 u[IU]/mL (ref 0.450–4.500)

## 2021-12-05 NOTE — Telephone Encounter (Signed)
Called patient (confirmed DOB) about normal results. Discussed prediabetes with patient as well. Has appointment scheduled with sports medicine.  ?

## 2021-12-10 ENCOUNTER — Ambulatory Visit: Payer: Medicare Other | Admitting: Family Medicine

## 2021-12-10 ENCOUNTER — Encounter: Payer: Self-pay | Admitting: Family Medicine

## 2021-12-10 VITALS — BP 150/92 | Ht 72.0 in | Wt 220.0 lb

## 2021-12-10 DIAGNOSIS — M7742 Metatarsalgia, left foot: Secondary | ICD-10-CM | POA: Diagnosis not present

## 2021-12-10 DIAGNOSIS — M7741 Metatarsalgia, right foot: Secondary | ICD-10-CM

## 2021-12-10 NOTE — Patient Instructions (Signed)
You have peripheral neuropathy and metatarsalgia. ?Try the inserts with metatarsal pads. ?If these are too uncomfortable you can remove the pads and just use the inserts which will help some. ?I will send a message to your doctor about titrating up the gabapentin which may help you also. ?These inserts typically last about 3-6 months. ?I would recommend following up with me somewhere in that time frame when it feels like they're not as supportive. ?

## 2021-12-10 NOTE — Progress Notes (Signed)
PCP: Eppie Gibson, MD ? ?Subjective:  ? ?HPI: ?Patient is a 66 y.o. male here for bilateral foot pain. ? ?Patient has had bilateral foot pain for several months. ?Difficulty walking due to pain he gets on dorsal foot but primarily under ball of foot. ?Has tried different shoes including new balance and still struggling with pain. ?He has known neuropathy and takes gabapentin '300mg'$  twice a day - doesn't feel like this helps him very much. ?Has tried some gel inserts only. ? ?Past Medical History:  ?Diagnosis Date  ? Allergy   ? Allergic rhinitis  ? Angina pectoris ([place]) 2006  ? Known coronary vasospasm -- treated with combination of long-acting nitrates and calcium channel blockers; NTG when necessary  ? CAD (coronary artery disease) 04/2005  ? Mild obstructive ramus intermedius disease (30-50%) and 50-60% proximal  ? Chronic back pain   ? Stemming from prior lumbar diskectomy  ? GERD (gastroesophageal reflux disease)   ? Hyperlipidemia   ? Hypertension   ? Obesity   ? Substance abuse ([place])   ? Known chronic alcoholic - not interested in quitting (current as of 2014)  ? ? ?Current Outpatient Medications on File Prior to Visit  ?Medication Sig Dispense Refill  ? albuterol (VENTOLIN HFA) 108 (90 Base) MCG/ACT inhaler Inhale 2 puffs into the lungs every 4 (four) hours as needed for wheezing or shortness of breath. 1 each 0  ? allopurinol (ZYLOPRIM) 100 MG tablet TAKE 1 TABLET(100 MG) BY MOUTH DAILY 30 tablet 6  ? amitriptyline (ELAVIL) 50 MG tablet TAKE 1 TABLET(50 MG) BY MOUTH AT BEDTIME AS NEEDED FOR SLEEP 90 tablet 3  ? amLODipine (NORVASC) 10 MG tablet TAKE 1 TABLET(10 MG) BY MOUTH DAILY 90 tablet 3  ? aspirin 81 MG EC tablet Take 81 mg by mouth daily.    ? benzonatate (TESSALON) 100 MG capsule Take 1 capsule (100 mg total) by mouth 2 (two) times daily as needed for cough. 20 capsule 0  ? busPIRone (BUSPAR) 30 MG tablet TAKE 1 TABLET(30 MG) BY MOUTH TWICE DAILY 180 tablet 3  ? cyclobenzaprine (FLEXERIL) 10 MG  tablet Take 1 tablet (10 mg total) by mouth 3 (three) times daily as needed for muscle spasms. 30 tablet 0  ? diclofenac Sodium (VOLTAREN) 1 % GEL Apply 4 g topically 4 (four) times daily. 150 g 1  ? gabapentin (NEURONTIN) 300 MG capsule TAKE 1 CAPSULE(300 MG) BY MOUTH THREE TIMES DAILY 60 capsule 11  ? HYDROcodone-acetaminophen (NORCO/VICODIN) 5-325 MG tablet Take 1 tablet by mouth 2 (two) times daily as needed. 60 tablet 0  ? ibuprofen (ADVIL) 800 MG tablet Take 1 tablet (800 mg total) by mouth every 8 (eight) hours as needed. (Patient not taking: Reported on 12/04/2021) 30 tablet 0  ? isosorbide mononitrate (IMDUR) 60 MG 24 hr tablet TAKE 1 TABLET(60 MG) BY MOUTH DAILY 90 tablet 3  ? metoprolol succinate (TOPROL-XL) 50 MG 24 hr tablet TAKE 1 TABLET(50 MG) BY MOUTH AT BEDTIME WITH OR IMMEDIATELY FOLLOWING A MEAL 90 tablet 3  ? montelukast (SINGULAIR) 10 MG tablet TAKE 1 TABLET(10 MG) BY MOUTH AT BEDTIME 90 tablet 2  ? naloxone (NARCAN) nasal spray 4 mg/0.1 mL To be used only in case of suspected opioid overdose 1 each 0  ? nitroGLYCERIN (NITROSTAT) 0.4 MG SL tablet PLACE 1 TABLET UNDER THE TONGUE IF NEEDED FOR CHEST PAIN EVERY 5 MINUTES FOR 3 DOSES. IF NO RELIEF AFTER 3RD DOSE CALL MD OR 911 25 tablet 0  ?  omeprazole (PRILOSEC) 20 MG capsule TAKE 1 CAPSULE(20 MG) BY MOUTH DAILY 90 capsule 3  ? pravastatin (PRAVACHOL) 40 MG tablet TAKE 1 TABLET(40 MG) BY MOUTH DAILY 90 tablet 3  ? ?No current facility-administered medications on file prior to visit.  ? ? ?Past Surgical History:  ?Procedure Laterality Date  ? [place]  ? Chronic back pain stemming from this  ? CARDIOVASCULAR STRESS TEST  2005  ? Dr. Daneen Schick -- Cardiolite stress test showed no evidence of ischemia  ? COLONOSCOPY W/ BIOPSIES  2011  ? Multiple small polyps and s/p polypectomy -- recommended repeat in 2 years due to multiple small polyps  ? ESOPHAGEAL DILATION  1997  ? ? ?Allergies  ?Allergen Reactions  ? Diphenhydramine Hcl   ?  Zantac [Ranitidine Hcl]   ? ? ?BP (!) 150/92   Ht 6' (1.829 m)   Wt 220 lb (99.8 kg)   BMI 29.84 kg/m?  ? ?   ? View : No data to display.  ?  ?  ?  ? ? ?   ? View : No data to display.  ?  ?  ?  ? ? ?    ?Objective:  ?Physical Exam: ? ?Gen: NAD, comfortable in exam room ? ?Bilateral feet/ankles: ?Pes cavus.  Mild transverse arch collapse but no toe splaying, bunion/bunionette, callus over MT heads.  No other gross deformity, swelling, ecchymoses ?FROM ankle without pain. ?TTP over MR heads 2-4 bilaterally. ?Thompsons test negative. ?NV intact distally. ?  ?Assessment & Plan:  ?1. Bilateral foot pain - due to known neuropathy and metatarsalgia.  Will trial metatarsal pads in sports insoles.  Can use larger pad if this is not supportive enough.  He is only on gabapentin '300mg'$  twice a day - may benefit from increasing this dosage but will defer to PCP. ?

## 2021-12-18 ENCOUNTER — Other Ambulatory Visit: Payer: Self-pay

## 2021-12-19 ENCOUNTER — Other Ambulatory Visit: Payer: Self-pay | Admitting: Family Medicine

## 2021-12-19 MED ORDER — HYDROCODONE-ACETAMINOPHEN 5-325 MG PO TABS: 1.0000 | ORAL_TABLET | Freq: Two times a day (BID) | ORAL | 0 refills | Status: DC | PRN

## 2021-12-24 ENCOUNTER — Other Ambulatory Visit: Payer: Self-pay

## 2021-12-24 DIAGNOSIS — G6289 Other specified polyneuropathies: Secondary | ICD-10-CM

## 2021-12-24 NOTE — Telephone Encounter (Signed)
Wife calls nurse line requesting a new prescription for Gabapentin TID.  ? ?Wife reports he was taking BID, however is now taking 3x per day after sports medicine visit.  ? ?Wife reports he will be out of current prescription in less than 7 days.  ? ?Will forward to PCP.  ?

## 2021-12-25 MED ORDER — GABAPENTIN 300 MG PO CAPS: ORAL_CAPSULE | ORAL | 11 refills | Status: DC

## 2022-01-24 ENCOUNTER — Ambulatory Visit (INDEPENDENT_AMBULATORY_CARE_PROVIDER_SITE_OTHER): Payer: Medicare Other | Admitting: Student

## 2022-01-24 ENCOUNTER — Encounter: Payer: Self-pay | Admitting: Student

## 2022-01-24 VITALS — BP 135/78 | HR 67 | Ht 72.0 in | Wt 221.2 lb

## 2022-01-24 DIAGNOSIS — G6289 Other specified polyneuropathies: Secondary | ICD-10-CM

## 2022-01-24 DIAGNOSIS — I10 Essential (primary) hypertension: Secondary | ICD-10-CM | POA: Diagnosis not present

## 2022-01-24 DIAGNOSIS — F1190 Opioid use, unspecified, uncomplicated: Secondary | ICD-10-CM | POA: Diagnosis not present

## 2022-01-24 DIAGNOSIS — Z23 Encounter for immunization: Secondary | ICD-10-CM | POA: Diagnosis not present

## 2022-01-24 DIAGNOSIS — M7741 Metatarsalgia, right foot: Secondary | ICD-10-CM | POA: Diagnosis not present

## 2022-01-24 DIAGNOSIS — M7742 Metatarsalgia, left foot: Secondary | ICD-10-CM | POA: Diagnosis not present

## 2022-01-24 DIAGNOSIS — Z0000 Encounter for general adult medical examination without abnormal findings: Secondary | ICD-10-CM

## 2022-01-24 MED ORDER — GABAPENTIN 300 MG PO CAPS: ORAL_CAPSULE | ORAL | 5 refills | Status: DC

## 2022-01-24 NOTE — Progress Notes (Signed)
? ? ?  SUBJECTIVE:  ? ?CHIEF COMPLAINT / HPI:  ? ?Chronic Pain ?Patient is on chronic Vicodin for pain secondary to a back injury in 1989 status post surgery.  He does not take it every day but does need it most days on an as-needed basis.  He ends up getting about 60 tablets every 2 months.  He also takes Tylenol and Flexeril.  Denies any drowsiness, brain fog, constipation.  No urinary retention, bowel/bladder incontinence, saddle anesthesia. ? ?Metatarsalgia ?Seen by Dr. Herb Grays in March and referred to sports medicine.  Sports medicine got him set up with some insoles which he reports have been modestly helpful.  He also takes gabapentin which seems to be helpful in treating both his metatarsalgia and his neuropathy secondary to his back injury.  He was taking 300 mg twice daily, after a phone conversation with him several months ago, he increased to 303 times daily with modest improvement.  He denies any excessive drowsiness related to the gabapentin. ? ?Health Maintenance ?Patient reports that he had a bad bout of flu recently and is interested in getting vaccinated against further respiratory illnesses this year.  Is amenable to a pneumonia vaccine today. ? ? ?OBJECTIVE:  ? ?BP 135/78   Pulse 67   Ht 6' (1.829 m)   Wt 221 lb 4 oz (100.4 kg)   SpO2 95%   BMI 30.01 kg/m?   ?Physical Exam ?Vitals reviewed.  ?Constitutional:   ?   General: He is not in acute distress. ?Cardiovascular:  ?   Rate and Rhythm: Normal rate and regular rhythm.  ?   Heart sounds: No murmur heard. ?  No friction rub. No gallop.  ?Pulmonary:  ?   Effort: Pulmonary effort is normal. No respiratory distress.  ?   Breath sounds: Normal breath sounds.  ?Feet:  ?   Comments: Foot exam tenderness to palpation across ball of foot, no deformity, warmth, or associated erythema ? ? ? ?ASSESSMENT/PLAN:  ? ?Chronic, continuous use of opioids ?PDMP reviewed and appropriate.  No refill needed at this time.  Has Narcan at home, no concern for abuse  or diversion. ?-Will refill next request ?-Follow-up in 3 months ? ?Metatarsalgia ?Improved with insoles. ?-Will increase evening dose of gabapentin to 600 mg, patient given instructions that he may self titrate to 900 mg at night as needed ?-Reassess at 52-monthfollow-up ? ?Healthcare maintenance ?Pneumococcal vaccination today ?  ? ? ?BPearla Dubonnet MD ?[facility] ?

## 2022-01-24 NOTE — Patient Instructions (Signed)
Mr. Diana, ? ?I am glad that you are doing well.  Your lab work and vital signs look very good today.  I am also pleased that you are having some improvement with the insoles Dr. Barbaraann Barthel set you up with.  I think that we can get even better control of your pain by increasing your gabapentin at night.  Please continue to take 300 mg for your daytime doses.  You may increase your nighttime dose to 600 mg.  If this works well for you, keep taking it.  If you feel like you could use some additional relief, you may go up to 900 mg with the nighttime dose.  I would like to see you back in about 3 months just to check in. ? ?Pearla Dubonnet, MD ? ?

## 2022-01-24 NOTE — Assessment & Plan Note (Signed)
PDMP reviewed and appropriate.  No refill needed at this time.  Has Narcan at home, no concern for abuse or diversion. ?-Will refill next request ?-Follow-up in 3 months ?

## 2022-01-24 NOTE — Assessment & Plan Note (Signed)
Improved with insoles. ?-Will increase evening dose of gabapentin to 600 mg, patient given instructions that he may self titrate to 900 mg at night as needed ?-Reassess at 49-monthfollow-up ?

## 2022-01-24 NOTE — Assessment & Plan Note (Signed)
Pneumococcal vaccination today ?

## 2022-01-24 NOTE — Addendum Note (Signed)
Addended by: Salvatore Marvel on: 01/24/2022 09:32 AM ? ? Modules accepted: Orders ? ?

## 2022-02-19 ENCOUNTER — Other Ambulatory Visit: Payer: Self-pay

## 2022-02-19 MED ORDER — HYDROCODONE-ACETAMINOPHEN 5-325 MG PO TABS: 1.0000 | ORAL_TABLET | Freq: Two times a day (BID) | ORAL | 0 refills | Status: DC | PRN

## 2022-03-06 ENCOUNTER — Other Ambulatory Visit: Payer: Self-pay | Admitting: Family Medicine

## 2022-03-18 DIAGNOSIS — G5761 Lesion of plantar nerve, right lower limb: Secondary | ICD-10-CM | POA: Diagnosis not present

## 2022-03-18 DIAGNOSIS — M21962 Unspecified acquired deformity of left lower leg: Secondary | ICD-10-CM | POA: Diagnosis not present

## 2022-03-18 DIAGNOSIS — M21961 Unspecified acquired deformity of right lower leg: Secondary | ICD-10-CM | POA: Diagnosis not present

## 2022-03-18 DIAGNOSIS — R262 Difficulty in walking, not elsewhere classified: Secondary | ICD-10-CM | POA: Diagnosis not present

## 2022-04-01 DIAGNOSIS — G5762 Lesion of plantar nerve, left lower limb: Secondary | ICD-10-CM | POA: Diagnosis not present

## 2022-04-15 DIAGNOSIS — G5762 Lesion of plantar nerve, left lower limb: Secondary | ICD-10-CM | POA: Diagnosis not present

## 2022-04-23 ENCOUNTER — Other Ambulatory Visit: Payer: Self-pay

## 2022-04-23 MED ORDER — HYDROCODONE-ACETAMINOPHEN 5-325 MG PO TABS: 1.0000 | ORAL_TABLET | Freq: Two times a day (BID) | ORAL | 0 refills | Status: DC | PRN

## 2022-04-28 ENCOUNTER — Other Ambulatory Visit: Payer: Self-pay | Admitting: Student

## 2022-05-20 ENCOUNTER — Other Ambulatory Visit: Payer: Self-pay | Admitting: Student

## 2022-05-30 ENCOUNTER — Other Ambulatory Visit: Payer: Self-pay | Admitting: Student

## 2022-07-19 ENCOUNTER — Ambulatory Visit (INDEPENDENT_AMBULATORY_CARE_PROVIDER_SITE_OTHER): Payer: Medicare Other | Admitting: Student

## 2022-07-19 ENCOUNTER — Ambulatory Visit (HOSPITAL_COMMUNITY)
Admission: RE | Admit: 2022-07-19 | Discharge: 2022-07-19 | Disposition: A | Discharge status: Home / Self Care | Payer: Medicare Other | Source: Ambulatory Visit | Attending: Family Medicine | Admitting: Family Medicine

## 2022-07-19 ENCOUNTER — Telehealth: Payer: Self-pay

## 2022-07-19 ENCOUNTER — Encounter: Payer: Self-pay | Admitting: Student

## 2022-07-19 ENCOUNTER — Other Ambulatory Visit: Payer: Self-pay | Admitting: Student

## 2022-07-19 VITALS — BP 158/80 | HR 138 | Wt 215.0 lb

## 2022-07-19 DIAGNOSIS — M7741 Metatarsalgia, right foot: Secondary | ICD-10-CM

## 2022-07-19 DIAGNOSIS — I4891 Unspecified atrial fibrillation: Secondary | ICD-10-CM

## 2022-07-19 DIAGNOSIS — R7309 Other abnormal glucose: Secondary | ICD-10-CM | POA: Diagnosis not present

## 2022-07-19 DIAGNOSIS — M7742 Metatarsalgia, left foot: Secondary | ICD-10-CM

## 2022-07-19 DIAGNOSIS — R7303 Prediabetes: Secondary | ICD-10-CM

## 2022-07-19 LAB — POCT GLYCOSYLATED HEMOGLOBIN (HGB A1C): HbA1c, POC (controlled diabetic range): 5.9 % (ref 0.0–7.0)

## 2022-07-19 MED ORDER — PREGABALIN 25 MG PO CAPS: 25.0000 mg | ORAL_CAPSULE | Freq: Two times a day (BID) | ORAL | 1 refills | Status: DC

## 2022-07-19 MED ORDER — APIXABAN 5 MG PO TABS: 5.0000 mg | ORAL_TABLET | Freq: Two times a day (BID) | ORAL | 1 refills | Status: DC

## 2022-07-19 MED ORDER — METOPROLOL SUCCINATE ER 100 MG PO TB24: 100.0000 mg | ORAL_TABLET | Freq: Every day | ORAL | 1 refills | Status: DC

## 2022-07-19 NOTE — Assessment & Plan Note (Signed)
A1c 5.9% today. - Continue lifestyle interventions

## 2022-07-19 NOTE — Assessment & Plan Note (Signed)
Interested in result of biopsy. Uncertain exactly what podiatrist would have been looking for.  - Will fax records request to podiatry office - Will trial Lyrica - F/u at next A Fib visit

## 2022-07-19 NOTE — Assessment & Plan Note (Signed)
New diagnosis today. Entirely asymptomatic. Has never had an echo. Only known cardiac disease is atherosclerosis for which he has been taking '81mg'$  ASA. Is on B-blocker, though given RVR today, dose likely insufficient. Will favor ambulatory management given lack of symptoms, though reviewed ED precautions for SOB, CP.  - Will increase metoprolol succinate to '100mg'$  daily - Eliquis '5mg'$  BID - Referred to cardiology, question whether he needs ASA and Eliquis long-term - Echocardiogram - TSH - F/u in 1-2 weeks

## 2022-07-19 NOTE — Telephone Encounter (Signed)
Received phone call from pharmacy regarding issues with pregabalin prescription. Insurance will not allow resident to prescribe.   Canceled prescription from Dr. Joelyn Oms. Will forward to preceptor to resend.   Thanks.   Talbot Grumbling, RN

## 2022-07-19 NOTE — Progress Notes (Signed)
    SUBJECTIVE:   CHIEF COMPLAINT / HPI:   Asymptomatic Tachycardia Patient noted to have a heart rate to 130s upon presentation to clinic. Rapidly decreased to 70s with rest. Subsequently spiked back to 138 during assessment. Patient denies any palpitations, lightheadedness, or dizziness. On further questioning reveals that he did have some lightheadedness a few days/weeks ago but did not think anything of this. Is currently on '50mg'$  of metoprolol succinate daily.   Foot Pain Previously diagnosed as metatarsalgia. Was taking gabapentin and Elavil and gabapentin without much improvement and has since stopped these medications. Had been seen by sports medicine and podiatry. At most recent podiatry visit in July it appears they did a needle biopsy of some sort, though records are not available.   Prediabetes Last A1c 6.2% 62moago. Not currently on any medications. Has cut back on beer.   OBJECTIVE:   BP (!) 158/80   Pulse (!) 138   Wt 215 lb (97.5 kg)   SpO2 96%   BMI 29.16 kg/m   Physical Exam Vitals reviewed.  Constitutional:      General: He is not in acute distress.    Appearance: He is not ill-appearing.  Cardiovascular:     Rate and Rhythm: Tachycardia present. Rhythm irregular.     Pulses: Normal pulses.  Pulmonary:     Effort: Pulmonary effort is normal.  Musculoskeletal:        General: No swelling.     Comments: Bilateral feet with foot pad pain without obvious swelling or deformity.   Skin:    General: Skin is warm and dry.     Capillary Refill: Capillary refill takes less than 2 seconds.  Neurological:     General: No focal deficit present.     Gait: Gait normal.  Psychiatric:        Mood and Affect: Mood normal.      ASSESSMENT/PLAN:   Atrial fibrillation with rapid ventricular response (HCC) New diagnosis today. Entirely asymptomatic. Has never had an echo. Only known cardiac disease is atherosclerosis for which he has been taking '81mg'$  ASA. Is on  B-blocker, though given RVR today, dose likely insufficient. Will favor ambulatory management given lack of symptoms, though reviewed ED precautions for SOB, CP.  - Will increase metoprolol succinate to '100mg'$  daily - Eliquis '5mg'$  BID - Referred to cardiology, question whether he needs ASA and Eliquis long-term - Echocardiogram - TSH - F/u in 1-2 weeks  Metatarsalgia Interested in result of biopsy. Uncertain exactly what podiatrist would have been looking for.  - Will fax records request to podiatry office - Will trial Lyrica - F/u at next A Fib visit  Prediabetes A1c 5.9% today. - Continue lifestyle interventions     BPearla Dubonnet MD CNash

## 2022-07-19 NOTE — Patient Instructions (Addendum)
Corey Watts,  It looks like you've Atrial Fibrillation.  A Few Things That We Will Do for This Now: I Am Sending in a New Medication Called Eliquis (Apixaban) That I Want You to Take 5 Mg Twice per Day Every Day.  I Am Also Increasing Your Metoprolol Dose to 100 Mg Daily.  I Sent a New Prescription to Your Pharmacy.  I Have Also Placed an Urgent Referral to Cardiology.  Hopefully They Will Call You in the Next Few Days to Set up an Appointment.  Please Go and See Them at Your Earliest Convenience.  I Have Also Made You a Return Appointment to See me on November 15.  If this time does not work, please call the front desk and have them change it. I am requesting records from your foot biopsy.  In the meantime, I am sending in a medication called Lyrica (pregabalin) that should help.  Pearla Dubonnet, MD

## 2022-07-20 LAB — TSH RFX ON ABNORMAL TO FREE T4: TSH: 3.12 u[IU]/mL (ref 0.450–4.500)

## 2022-07-22 MED ORDER — PREGABALIN 25 MG PO CAPS: 25.0000 mg | ORAL_CAPSULE | Freq: Two times a day (BID) | ORAL | 1 refills | Status: DC

## 2022-07-31 ENCOUNTER — Ambulatory Visit: Payer: Self-pay | Admitting: Student

## 2022-08-02 ENCOUNTER — Encounter: Payer: Self-pay | Admitting: Student

## 2022-08-02 ENCOUNTER — Ambulatory Visit (INDEPENDENT_AMBULATORY_CARE_PROVIDER_SITE_OTHER): Payer: Medicare Other | Admitting: Student

## 2022-08-02 VITALS — BP 138/80 | HR 132 | Ht 72.0 in | Wt 219.6 lb

## 2022-08-02 DIAGNOSIS — H7291 Unspecified perforation of tympanic membrane, right ear: Secondary | ICD-10-CM | POA: Insufficient documentation

## 2022-08-02 DIAGNOSIS — G8929 Other chronic pain: Secondary | ICD-10-CM

## 2022-08-02 DIAGNOSIS — I4891 Unspecified atrial fibrillation: Secondary | ICD-10-CM

## 2022-08-02 DIAGNOSIS — M10061 Idiopathic gout, right knee: Secondary | ICD-10-CM

## 2022-08-02 DIAGNOSIS — M545 Low back pain, unspecified: Secondary | ICD-10-CM | POA: Diagnosis not present

## 2022-08-02 MED ORDER — METOPROLOL SUCCINATE ER 50 MG PO TB24: 150.0000 mg | ORAL_TABLET | Freq: Every day | ORAL | 1 refills | Status: DC

## 2022-08-02 MED ORDER — ALLOPURINOL 100 MG PO TABS: ORAL_TABLET | ORAL | 2 refills | Status: DC

## 2022-08-02 MED ORDER — HYDROCODONE-ACETAMINOPHEN 5-325 MG PO TABS: 1.0000 | ORAL_TABLET | Freq: Two times a day (BID) | ORAL | 0 refills | Status: DC | PRN

## 2022-08-02 NOTE — Patient Instructions (Addendum)
Corey Watts,  Let's increase your metoprolol to '150mg'$  daily. I sent a 90 day supply.  Your ENT doc retired, so I'm sending a new, urgent referral. Be on the lookout for a call from them. I recommend you do NOT use Q tips in your ear. I will eagerly await the note from the cardiologist. If you develop worsening shortness of breath that does not go away or develop new swelling in your legs or belly, please go to the ER. Come back to see me about two weeks after your cardiology appointment. Pearla Dubonnet, MD

## 2022-08-02 NOTE — Assessment & Plan Note (Signed)
Inadequate rate control on metoprolol succinate '100mg'$  daily. Rate 123 at rest, 132 ambulating. Good adherence to Eliquis. Sees cardiology in 2 weeks. Has not yet had echo. No known CHF and no new signs/symptoms to suggest new HF at this time. - Increase metoprolol to '150mg'$  daily - Continue BID Eliquis - Cardiology in 2 weeks, back with me 2 weeks after that - Strict ED precautions reviewed

## 2022-08-02 NOTE — Assessment & Plan Note (Addendum)
Unfortunately the ENT that he saw last year is no longer in practice. Will need a new referral. Will place urgently given the extent of injury and need for close, specialist monitoring. No evidence of infection at this time, but low threshold to start antibiotics should symptoms progress.  Suspect Q-tip was cause of injury. Advised to stop Q tip use immediately.

## 2022-08-02 NOTE — Progress Notes (Signed)
    SUBJECTIVE:   CHIEF COMPLAINT / HPI:   A Fib with RVR New diagnosis about 2 weeks ago. Increased metoprolol 50>'100mg'$  daily at that visit and started Eliquis. Has been taking Eliquis without issue. Going about his day-to-day activities with minimal issue. Does have instances, even at rest, where he becomes short of breath and feels his heart racing. These tend to last 1-3 minutes and are self-resolving. Has an appointment with Cardiology in 2 weeks. No new swelling in his legs/belly. No CP.   R ear pain For 1-2 weeks. Had an episode of sharp pain, since then decreased hearing on that side accompanied by pain when he lies on that side. Does have a history of cerumen issues, seen by ENT about a year ago. Uses Q tips regularly. No fevers. Has had some concurrent nasal congestion over the same time period.   OBJECTIVE:   BP 138/80   Pulse (!) 132 Comment: while ambulating  Ht 6' (1.829 m)   Wt 219 lb 9.6 oz (99.6 kg)   SpO2 93% Comment: while ambulating  BMI 29.78 kg/m   Gen: Awake and alert, NAD. Ambulates without issue.  Ears: R TM entirely obliterated with wax covering underlying ear structures. No erythema or purulence. Unable to visualize L TM 2/2 heavy cerumen burden. Nose: Congestion present Neck: Supple, without LAD Cardio: Tachycardic, irregular, no murmur Pulm: Normal WOB, lungs clear in all fields  ASSESSMENT/PLAN:   Atrial fibrillation with rapid ventricular response (HCC) Inadequate rate control on metoprolol succinate '100mg'$  daily. Rate 123 at rest, 132 ambulating. Good adherence to Eliquis. Sees cardiology in 2 weeks. Has not yet had echo. No known CHF and no new signs/symptoms to suggest new HF at this time. - Increase metoprolol to '150mg'$  daily - Continue BID Eliquis - Cardiology in 2 weeks, back with me 2 weeks after that - Strict ED precautions reviewed  Perforation of right tympanic membrane Unfortunately the ENT that he saw last year is no longer in practice.  Will need a new referral. Will place urgently given the extent of injury and need for close, specialist monitoring. No evidence of infection at this time, but low threshold to start antibiotics should symptoms progress.  Suspect Q-tip was cause of injury. Advised to stop Q tip use immediately.      Pearla Dubonnet, MD [place]

## 2022-08-12 ENCOUNTER — Ambulatory Visit (HOSPITAL_COMMUNITY)
Admission: RE | Admit: 2022-08-12 | Discharge: 2022-08-12 | Disposition: A | Discharge status: Home / Self Care | Payer: Medicare Other | Source: Ambulatory Visit | Attending: Family Medicine | Admitting: Family Medicine

## 2022-08-12 DIAGNOSIS — I4891 Unspecified atrial fibrillation: Secondary | ICD-10-CM

## 2022-08-12 DIAGNOSIS — I081 Rheumatic disorders of both mitral and tricuspid valves: Secondary | ICD-10-CM | POA: Diagnosis not present

## 2022-08-12 DIAGNOSIS — E785 Hyperlipidemia, unspecified: Secondary | ICD-10-CM | POA: Insufficient documentation

## 2022-08-12 DIAGNOSIS — I10 Essential (primary) hypertension: Secondary | ICD-10-CM | POA: Insufficient documentation

## 2022-08-12 LAB — ECHOCARDIOGRAM COMPLETE: S' Lateral: 3.7 cm

## 2022-08-14 ENCOUNTER — Other Ambulatory Visit: Payer: Self-pay | Admitting: Student

## 2022-08-15 NOTE — Progress Notes (Signed)
Cardiology Office Note:    Date:  08/16/2022   ID:  Corey Ranch Sr., DOB 1955/10/19, MRN [id number]  PCP:  Eppie Gibson, MD   [facility] Cardiologist:  Werner Lean, MD     Referring MD: Lind Covert, *   CC: SOB Consulted for the evaluation of atrial fibrillation at the behest of Dr. Erin Hearing  History of Present Illness:    Corey Talent. is a 66 y.o. male with a hx of Moderate non obstructive CAD in 2458 complicated by coronary vasospasm.  Alcohol use, atrial fibrillation, and morbid obesity. New Heart Failure with reduced EF.   Patient notes that he is feeling SOB for 2-3 months..   He has DOE and SOB rest.   He started eliquis one month prior with no issues.  No f/u Hgb.  No bleeding.  Has had rare chest pain that last 2-3 seconds.  Feels like a fast squeeze. Last occurred Wednesday of last week.  Can't do ADLs with out  SOB comes and goes..  No PND or orthopnea.  No weight gain, leg swelling , or abdominal swelling.  He had had some near syncope with high rate. . Notes  no palpitations or funny heart beats (He is in A RVR) He also has vertigo wth a burst ear drum.rupture.  He has cut back his drinking but presently willing to consider to cut down to only a beer a day. Is pre-contemplative for complete cessation.  He has concern about taking heart medications.   Past Medical History:  Diagnosis Date   Acute hearing loss of left ear 03/12/2021   Allergy    Allergic rhinitis   Angina pectoris ([place]) 09/16/2004   Known coronary vasospasm -- treated with combination of long-acting nitrates and calcium channel blockers; NTG when necessary   CAD (coronary artery disease) 04/16/2005   Mild obstructive ramus intermedius disease (30-50%) and 50-60% proximal   Chronic back pain    Stemming from prior lumbar diskectomy   GERD (gastroesophageal reflux disease)    Hyperlipidemia    Hypertension    Obesity    Substance abuse  ([place])    Known chronic alcoholic - not interested in quitting (current as of 2014)    Past Surgical History:  Procedure Laterality Date   [place]   Chronic back pain stemming from this   [place]  2005   Dr. Daneen Schick -- Cardiolite stress test showed no evidence of ischemia   COLONOSCOPY W/ BIOPSIES  2011   Multiple small polyps and s/p polypectomy -- recommended repeat in 2 years due to multiple small polyps   ESOPHAGEAL DILATION  1997    Current Medications: Current Meds  Medication Sig   allopurinol (ZYLOPRIM) 100 MG tablet TAKE 1 TABLET(100 MG) BY MOUTH DAILY   amLODipine (NORVASC) 10 MG tablet TAKE 1 TABLET(10 MG) BY MOUTH DAILY   apixaban (ELIQUIS) 5 MG TABS tablet Take 1 tablet (5 mg total) by mouth 2 (two) times daily.   busPIRone (BUSPAR) 30 MG tablet TAKE 1 TABLET(30 MG) BY MOUTH TWICE DAILY   cyclobenzaprine (FLEXERIL) 10 MG tablet Take 1 tablet (10 mg total) by mouth 3 (three) times daily as needed for muscle spasms.   HYDROcodone-acetaminophen (NORCO/VICODIN) 5-325 MG tablet Take 1 tablet by mouth 2 (two) times daily as needed.   isosorbide mononitrate (IMDUR) 60 MG 24 hr tablet TAKE 1 TABLET(60 MG) BY MOUTH DAILY   losartan (COZAAR)  25 MG tablet Take 1 tablet (25 mg total) by mouth daily.   metoprolol succinate (TOPROL-XL) 50 MG 24 hr tablet Take 3 tablets (150 mg total) by mouth daily. Take with or immediately following a meal.   METOPROLOL SUCCINATE ER PO Take 150 mg by mouth daily at 6 (six) AM.   montelukast (SINGULAIR) 10 MG tablet TAKE 1 TABLET(10 MG) BY MOUTH AT BEDTIME   naloxone (NARCAN) nasal spray 4 mg/0.1 mL To be used only in case of suspected opioid overdose   nitroGLYCERIN (NITROSTAT) 0.4 MG SL tablet PLACE 1 TABLET UNDER THE TONGUE IF NEEDED FOR CHEST PAIN EVERY 5 MINUTES FOR 3 DOSES. IF NO RELIEF AFTER 3RD DOSE CALL MD OR 911   omeprazole (PRILOSEC) 20 MG capsule TAKE 1 CAPSULE(20 MG) BY MOUTH DAILY   pravastatin  (PRAVACHOL) 40 MG tablet TAKE 1 TABLET(40 MG) BY MOUTH DAILY   pregabalin (LYRICA) 25 MG capsule Take 1 capsule (25 mg total) by mouth 2 (two) times daily.   [DISCONTINUED] aspirin 81 MG EC tablet Take 81 mg by mouth daily.     Allergies:   Diphenhydramine hcl and Zantac [ranitidine hcl]   Social History   Socioeconomic History   Marital status: Married    Spouse name: Hoyle Sauer   Number of children: 2   Years of education: 8   Highest education level: 8th grade  Occupational History   Occupation: Disabled   Tobacco Use   Smoking status: Former    Packs/day: 2.00    Types: Cigarettes    Passive exposure: Past   Smokeless tobacco: Former    Quit date: 1991   Tobacco comments:    2PPD smoker until 1991  Vaping Use   Vaping Use: Never used  Substance and Sexual Activity   Alcohol use: Yes    Alcohol/week: 3.0 standard drinks of alcohol    Types: 3 Cans of beer per week   Drug use: Not Currently   Sexual activity: Yes    Birth control/protection: Post-menopausal, Surgical  Other Topics Concern   Not on file  Social History Narrative   Patient lives with his wife Hoyle Sauer.    Patient has 2 adult children, with 6 grandchildren, 1 great grandchild.    Patients family lives all on the same street.    Patient enjoys being outside, fixing up his home, and spending time with his family.    Social Determinants of Health   Financial Resource Strain: Low Risk  (11/16/2020)   Overall Financial Resource Strain (CARDIA)    Difficulty of Paying Living Expenses: Not hard at all  Food Insecurity: No Food Insecurity (11/16/2020)   Hunger Vital Sign    Worried About Running Out of Food in the Last Year: Never true    Ran Out of Food in the Last Year: Never true  Transportation Needs: No Transportation Needs (11/16/2020)   PRAPARE - Hydrologist (Medical): No    Lack of Transportation (Non-Medical): No  Physical Activity: Inactive (11/16/2020)   Exercise Vital Sign     Days of Exercise per Week: 0 days    Minutes of Exercise per Session: 0 min  Stress: No Stress Concern Present (11/16/2020)   [place]    Feeling of Stress : Only a little  Social Connections: Moderately Isolated (11/16/2020)   Social Connection and Isolation Panel [NHANES]    Frequency of Communication with Friends and Family: More than three times a  week    Frequency of Social Gatherings with Friends and Family: More than three times a week    Attends Religious Services: Never    Marine scientist or Organizations: No    Attends Music therapist: Never    Marital Status: Married     Family History: The patient's family history includes Heart disease in his father; Heart failure in his father and mother.  ROS:   Please see the history of present illness.     All other systems reviewed and are negative.  EKGs/Labs/Other Studies Reviewed:    The following studies were reviewed today:  EKG:  EKG is  ordered today.  The ekg ordered today demonstrates  08/16/22: Afib RVR  Cardiac Studies & Procedures       ECHOCARDIOGRAM  ECHOCARDIOGRAM COMPLETE 08/12/2022  Narrative ECHOCARDIOGRAM REPORT    Patient Name:   Corey NOVELLO Sr. Date of Exam: 08/12/2022 Medical Rec #:  [id number]           Height:       72.0 in Accession #:    [id number]          Weight:       219.6 lb Date of Birth:  Apr 22, 1956           BSA:          2.217 m Patient Age:    59 years            BP:           138/80 mmHg Patient Gender: M                   HR:           139 bpm. Exam Location:  [facility]  Procedure: 2D Echo, Cardiac Doppler and Color Doppler  Indications:    Atrial Fibrillation with rapid ventricular response I48.91  History:        Patient has no prior history of Echocardiogram examinations. CAD; Risk Factors:Hypertension and Dyslipidemia.  Sonographer:    Mikki Santee RDCS Referring Phys:  Eppie Gibson  IMPRESSIONS   1. Left ventricular ejection fraction, by estimation, is 30 to 35%. The left ventricle has moderately decreased function. The left ventricle demonstrates global hypokinesis. Left ventricular diastolic parameters are indeterminate. 2. Right ventricular systolic function is mildly reduced. The right ventricular size is normal. There is mildly elevated pulmonary artery systolic pressure. The estimated right ventricular systolic pressure is 42.3 mmHg. 3. Left atrial size was mildly dilated. 4. Right atrial size was moderately dilated. 5. The mitral valve is normal in structure. Mild mitral valve regurgitation. No evidence of mitral stenosis. 6. The aortic valve is tricuspid. Aortic valve regurgitation is not visualized. No aortic stenosis is present. 7. Aortic dilatation noted. There is mild dilatation of the ascending aorta, measuring 41 mm. 8. The inferior vena cava is dilated in size with <50% respiratory variability, suggesting right atrial pressure of 15 mmHg. 9. The patient is in rapid atrial fibrillation. Needs attention towards rate control and likely cardioversion. Need to reassess LV systolic function when in NSR.  FINDINGS Left Ventricle: Left ventricular ejection fraction, by estimation, is 30 to 35%. The left ventricle has moderately decreased function. The left ventricle demonstrates global hypokinesis. The left ventricular internal cavity size was normal in size. There is no left ventricular hypertrophy. Left ventricular diastolic parameters are indeterminate.  Right Ventricle: The right ventricular size is normal. No increase in right ventricular wall  thickness. Right ventricular systolic function is mildly reduced. There is mildly elevated pulmonary artery systolic pressure. The tricuspid regurgitant velocity is 2.30 m/s, and with an assumed right atrial pressure of 15 mmHg, the estimated right ventricular systolic pressure is 47.8 mmHg.  Left Atrium:  Left atrial size was mildly dilated.  Right Atrium: Right atrial size was moderately dilated.  Pericardium: There is no evidence of pericardial effusion.  Mitral Valve: The mitral valve is normal in structure. Mild mitral valve regurgitation. No evidence of mitral valve stenosis.  Tricuspid Valve: The tricuspid valve is normal in structure. Tricuspid valve regurgitation is mild.  Aortic Valve: The aortic valve is tricuspid. Aortic valve regurgitation is not visualized. No aortic stenosis is present.  Pulmonic Valve: The pulmonic valve was normal in structure. Pulmonic valve regurgitation is trivial.  Aorta: Aortic dilatation noted. There is mild dilatation of the ascending aorta, measuring 41 mm.  Venous: The inferior vena cava is dilated in size with less than 50% respiratory variability, suggesting right atrial pressure of 15 mmHg.  IAS/Shunts: No atrial level shunt detected by color flow Doppler.   LEFT VENTRICLE PLAX 2D LVIDd:         5.40 cm LVIDs:         3.70 cm LV PW:         0.90 cm LV IVS:        0.80 cm LVOT diam:     2.10 cm LV SV:         39 LV SV Index:   18 LVOT Area:     3.46 cm   RIGHT VENTRICLE RV Basal diam:  3.90 cm RV Mid diam:    3.30 cm  LEFT ATRIUM             Index        RIGHT ATRIUM           Index LA diam:        3.40 cm 1.53 cm/m   RA Area:     25.20 cm LA Vol (A2C):   71.2 ml 32.12 ml/m  RA Volume:   80.30 ml  36.22 ml/m LA Vol (A4C):   69.6 ml 31.39 ml/m LA Biplane Vol: 73.4 ml 33.11 ml/m AORTIC VALVE LVOT Vmax:   81.20 cm/s LVOT Vmean:  53.267 cm/s LVOT VTI:    0.113 m  AORTA Ao Root diam: 3.80 cm Ao Asc diam:  4.10 cm  TRICUSPID VALVE TR Peak grad:   21.2 mmHg TR Vmax:        230.00 cm/s  SHUNTS Systemic VTI:  0.11 m Systemic Diam: 2.10 cm  Dalton McleanMD Electronically signed by Franki Monte Signature Date/Time: 08/12/2022/5:06:14 PM    Final              Recent Labs: 12/04/2021: ALT 21; BUN 11;  Creatinine, Ser 0.98; Hemoglobin 15.3; Platelets 213; Potassium 4.7; Sodium 143 07/19/2022: TSH 3.120  Recent Lipid Panel    Component Value Date/Time   CHOL 174 11/17/2019 1125   TRIG 244 (H) 11/17/2019 1125   HDL 35 (L) 11/17/2019 1125   CHOLHDL 5.0 11/17/2019 1125   CHOLHDL 3.9 02/14/2015 0955   VLDL 76 (H) 02/14/2015 0955   LDLCALC 97 11/17/2019 1125     Risk Assessment/Calculations:    CHA2DS2-VASc Score = 4   This indicates a 4.8% annual risk of stroke. The patient's score is based upon: CHF History: 1 HTN History: 1 Diabetes History: 0 Stroke History: 0 Vascular Disease History:  1 Age Score: 1 Gender Score: 0          Physical Exam:    VS:  BP (!) 138/90   Pulse (!) 123   Ht 6' (1.829 m)   Wt 215 lb (97.5 kg)   SpO2 95%   BMI 29.16 kg/m     Wt Readings from Last 3 Encounters:  08/16/22 215 lb (97.5 kg)  08/02/22 219 lb 9.6 oz (99.6 kg)  07/19/22 215 lb (97.5 kg)     GEN: No distress HEENT: Normal NECK: No JVD; No carotid bruits LYMPHATICS: No lymphadenopathy CARDIAC: IRIR tachycardia, no murmurs, rubs, gallops RESPIRATORY:  Clear to auscultation without rales, wheezing or rhonchi  ABDOMEN: Soft, non-tender, non-distended MUSCULOSKELETAL:  No edema; No deformity  SKIN: Warm and dry NEUROLOGIC:  Alert and oriented x 3 PSYCHIATRIC:  Normal affect   ASSESSMENT:    1. Atrial fibrillation with rapid ventricular response (HCC)   2. HFrEF (heart failure with reduced ejection fraction) ([place])   3. Essential hypertension, benign   4. Atherosclerosis of native coronary artery of native heart without angina pectoris    PLAN:    Heart Failure Reduced Ejection Fraction  Moderate non obstructive CAD - NYHA class III, Stage C, euvolemic, etiology from AF or alcohol suspected - Diuretic regimen: appears euvolemic  - Discussed the importance of fluid restriction of < 2 L, salt restriction, and checking daily weights  - will get BMP - discussed alcohol  cesstion  - continue succinate 150 mg; will not further increase - adding low dose ARB; discussed at length GDMT but he is skeptical - deferring MRA and SGLT2i start now  - no PRN nitro needed; continue IMDUR - not amenable to increase statin trial presently, continue pravastatin  Paroxsymal Atrial Fibrillation,  - CHADSVASC=4. - Continue anticoagulation with eliquis; Acquired Thrombophilia - discussed alcohol, discussed exercise as preventive factors - Considerations for DCCV; Consent for DCCV then f/u with AF clinic; I have discussed risk and benefits and that continuing to drink increases risk of failed DCCV. - CBC today  Will get DCCV, then will try to add GDMT; needs post DCCV A fib cliic eval; then me or APP in ~ 3 months; will consider GDMT again and alcohol cessation       Shared Decision Making/Informed Consent The risks (stroke, cardiac arrhythmias rarely resulting in the need for a temporary or permanent pacemaker, skin irritation or burns and complications associated with conscious sedation including aspiration, arrhythmia, respiratory failure and death), benefits (restoration of normal sinus rhythm) and alternatives of a direct current cardioversion were explained in detail to Mr. Coble and he agrees to proceed.      Medication Adjustments/Labs and Tests Ordered: Current medicines are reviewed at length with the patient today.  Concerns regarding medicines are outlined above.  Orders Placed This Encounter  Procedures   CBC   Basic metabolic panel   Amb Referral to AFIB Clinic   EKG 12-Lead   Meds ordered this encounter  Medications   losartan (COZAAR) 25 MG tablet    Sig: Take 1 tablet (25 mg total) by mouth daily.    Dispense:  90 tablet    Refill:  3    Patient Instructions  Medication Instructions:  Your physician has recommended you make the following change in your medication:  START: losartan 25 mg by mouth once daily  PLEASE: DO NOT miss any doses  of your Eliquis if you do please call our office to let us know.   *  If you need a refill on your cardiac medications before your next appointment, please call your pharmacy*   Lab Work: TODAY: CBC, BMP  If you have labs (blood work) drawn today and your tests are completely normal, you will receive your results only by: [place] (if you have MyChart) OR A paper copy in the mail If you have any lab test that is abnormal or we need to change your treatment, we will call you to review the results.   Testing/Procedures: Your physician has recommended that you have a Cardioversion (DCCV). Electrical Cardioversion uses a jolt of electricity to your heart either through paddles or wired patches attached to your chest. This is a controlled, usually prescheduled, procedure. Defibrillation is done under light anesthesia in the hospital, and you usually go home the day of the procedure. This is done to get your heart back into a normal rhythm. You are not awake for the procedure. Please see the instruction sheet given to you today.    Your physician has referred you to see the [facility] after your 08/30/22 Cardioversion.   Follow-Up: At [facility], you and your health needs are our priority.  As part of our continuing mission to provide you with exceptional heart care, we have created designated Provider Care Teams.  These Care Teams include your primary Cardiologist (physician) and Advanced Practice Providers (APPs -  Physician Assistants and Nurse Practitioners) who all work together to provide you with the care you need, when you need it.  We recommend signing up for the patient portal called "MyChart".  Sign up information is provided on this After Visit Summary.  MyChart is used to connect with patients for Virtual Visits (Telemedicine).  Patients are able to view lab/test results, encounter notes, upcoming appointments, etc.  Non-urgent messages can be sent to  your provider as well.   To learn more about what you can do with MyChart, go to [email or web address].    Your next appointment:   3-4 month(s)  The format for your next appointment:   In Person  Provider:   Werner Lean, MD     Other Instructions  You are scheduled for a Cardioversion on Friday, December 15 with Dr. Stanford Breed.  Please arrive at the [facility] (Main Entrance A) at [facility]: [address] [place], [place] [address] at 8:00 am.   DIET:  Nothing to eat or drink after midnight except a sip of water with medications (see medication instructions below)  MEDICATION INSTRUCTIONS:       Continue taking your anticoagulant (blood thinner): Apixaban (Eliquis).  You will need to continue this after your procedure until you are told by your provider that it is safe to stop.    LABS: Your labs will be done at the hospital prior to your procedure - you will need to arrive 1 and 1/2 hours prior to your procedure.  FYI:  For your safety, and to allow [place] to monitor your vital signs accurately during the surgery/procedure we request: If you have artificial nails, gel coating, SNS etc, please have those removed prior to your surgery/procedure. Not having the nail coverings /polish removed may result in cancellation or delay of your surgery/procedure.  You must have a responsible person to drive you home and stay in the waiting area during your procedure. Failure to do so could result in cancellation.  Bring your insurance cards.  *Special Note: Every effort is made to have your procedure done on  time. Occasionally there are emergencies that occur at the hospital that may cause delays. Please be patient if a delay does occur.      Important Information About Sugar         Signed, Werner Lean, MD  08/16/2022 10:34 AM    [place]

## 2022-08-16 ENCOUNTER — Ambulatory Visit: Payer: Medicare Other | Attending: Internal Medicine | Admitting: Internal Medicine

## 2022-08-16 ENCOUNTER — Encounter: Payer: Self-pay | Admitting: Internal Medicine

## 2022-08-16 VITALS — BP 138/90 | HR 123 | Ht 72.0 in | Wt 215.0 lb

## 2022-08-16 DIAGNOSIS — I4891 Unspecified atrial fibrillation: Secondary | ICD-10-CM | POA: Diagnosis not present

## 2022-08-16 DIAGNOSIS — I10 Essential (primary) hypertension: Secondary | ICD-10-CM

## 2022-08-16 DIAGNOSIS — I2510 Atherosclerotic heart disease of native coronary artery without angina pectoris: Secondary | ICD-10-CM | POA: Diagnosis not present

## 2022-08-16 DIAGNOSIS — I5020 Unspecified systolic (congestive) heart failure: Secondary | ICD-10-CM | POA: Diagnosis not present

## 2022-08-16 DIAGNOSIS — I5032 Chronic diastolic (congestive) heart failure: Secondary | ICD-10-CM | POA: Insufficient documentation

## 2022-08-16 MED ORDER — LOSARTAN POTASSIUM 25 MG PO TABS: 25.0000 mg | ORAL_TABLET | Freq: Every day | ORAL | 3 refills | Status: DC

## 2022-08-16 NOTE — Patient Instructions (Addendum)
Medication Instructions:  Your physician has recommended you make the following change in your medication:  START: losartan 25 mg by mouth once daily  PLEASE: DO NOT miss any doses of your Eliquis if you do please call our office to let us know.   *If you need a refill on your cardiac medications before your next appointment, please call your pharmacy*   Lab Work: TODAY: CBC, BMP  If you have labs (blood work) drawn today and your tests are completely normal, you will receive your results only by: [place] (if you have MyChart) OR A paper copy in the mail If you have any lab test that is abnormal or we need to change your treatment, we will call you to review the results.   Testing/Procedures: Your physician has recommended that you have a Cardioversion (DCCV). Electrical Cardioversion uses a jolt of electricity to your heart either through paddles or wired patches attached to your chest. This is a controlled, usually prescheduled, procedure. Defibrillation is done under light anesthesia in the hospital, and you usually go home the day of the procedure. This is done to get your heart back into a normal rhythm. You are not awake for the procedure. Please see the instruction sheet given to you today.    Your physician has referred you to see the [facility] after your 08/30/22 Cardioversion.   Follow-Up: At [facility], you and your health needs are our priority.  As part of our continuing mission to provide you with exceptional heart care, we have created designated Provider Care Teams.  These Care Teams include your primary Cardiologist (physician) and Advanced Practice Providers (APPs -  Physician Assistants and Nurse Practitioners) who all work together to provide you with the care you need, when you need it.  We recommend signing up for the patient portal called "MyChart".  Sign up information is provided on this After Visit Summary.  MyChart is used to  connect with patients for Virtual Visits (Telemedicine).  Patients are able to view lab/test results, encounter notes, upcoming appointments, etc.  Non-urgent messages can be sent to your provider as well.   To learn more about what you can do with MyChart, go to [email or web address].    Your next appointment:   3-4 month(s)  The format for your next appointment:   In Person  Provider:   Werner Lean, MD     Other Instructions  You are scheduled for a Cardioversion on Friday, December 15 with Dr. Stanford Breed.  Please arrive at the [facility] (Main Entrance A) at [facility]: [address] [place], [place] [address] at 8:00 am.   DIET:  Nothing to eat or drink after midnight except a sip of water with medications (see medication instructions below)  MEDICATION INSTRUCTIONS:       Continue taking your anticoagulant (blood thinner): Apixaban (Eliquis).  You will need to continue this after your procedure until you are told by your provider that it is safe to stop.    LABS: Your labs will be done at the hospital prior to your procedure - you will need to arrive 1 and 1/2 hours prior to your procedure.  FYI:  For your safety, and to allow [place] to monitor your vital signs accurately during the surgery/procedure we request: If you have artificial nails, gel coating, SNS etc, please have those removed prior to your surgery/procedure. Not having the nail coverings /polish removed may result in cancellation or delay of your  surgery/procedure.  You must have a responsible person to drive you home and stay in the waiting area during your procedure. Failure to do so could result in cancellation.  Bring your insurance cards.  *Special Note: Every effort is made to have your procedure done on time. Occasionally there are emergencies that occur at the hospital that may cause delays. Please be patient if a delay does occur.      Important Information About Sugar

## 2022-08-17 LAB — BASIC METABOLIC PANEL
BUN/Creatinine Ratio: 15 (ref 10–24)
BUN: 15 mg/dL (ref 8–27)
CO2: 23 mmol/L (ref 20–29)
Calcium: 9.8 mg/dL (ref 8.6–10.2)
Chloride: 101 mmol/L (ref 96–106)
Creatinine, Ser: 0.99 mg/dL (ref 0.76–1.27)
Glucose: 86 mg/dL (ref 70–99)
Potassium: 4.2 mmol/L (ref 3.5–5.2)
Sodium: 141 mmol/L (ref 134–144)
eGFR: 84 mL/min/{1.73_m2} (ref >59)

## 2022-08-17 LAB — CBC
Hematocrit: 44.3 % (ref 37.5–51.0)
Hemoglobin: 14.9 g/dL (ref 13.0–17.7)
MCH: 32.7 pg (ref 26.6–33.0)
MCHC: 33.6 g/dL (ref 31.5–35.7)
MCV: 97 fL (ref 79–97)
Platelets: 250 10*3/uL (ref 150–450)
RBC: 4.56 x10E6/uL (ref 4.14–5.80)
RDW: 13 % (ref 11.6–15.4)
WBC: 8.6 10*3/uL (ref 3.4–10.8)

## 2022-08-26 ENCOUNTER — Encounter: Payer: Self-pay | Admitting: Student

## 2022-08-26 ENCOUNTER — Other Ambulatory Visit: Payer: Self-pay | Admitting: Student

## 2022-08-26 ENCOUNTER — Ambulatory Visit (INDEPENDENT_AMBULATORY_CARE_PROVIDER_SITE_OTHER): Payer: Medicare Other | Admitting: Student

## 2022-08-26 VITALS — BP 128/78 | HR 102 | Ht 72.0 in | Wt 214.0 lb

## 2022-08-26 DIAGNOSIS — M7741 Metatarsalgia, right foot: Secondary | ICD-10-CM | POA: Diagnosis not present

## 2022-08-26 DIAGNOSIS — I4891 Unspecified atrial fibrillation: Secondary | ICD-10-CM | POA: Diagnosis not present

## 2022-08-26 DIAGNOSIS — B349 Viral infection, unspecified: Secondary | ICD-10-CM

## 2022-08-26 DIAGNOSIS — I4819 Other persistent atrial fibrillation: Secondary | ICD-10-CM

## 2022-08-26 DIAGNOSIS — I2510 Atherosclerotic heart disease of native coronary artery without angina pectoris: Secondary | ICD-10-CM | POA: Diagnosis not present

## 2022-08-26 DIAGNOSIS — M7742 Metatarsalgia, left foot: Secondary | ICD-10-CM

## 2022-08-26 DIAGNOSIS — R0981 Nasal congestion: Secondary | ICD-10-CM | POA: Diagnosis not present

## 2022-08-26 MED ORDER — PREGABALIN 25 MG PO CAPS: 25.0000 mg | ORAL_CAPSULE | Freq: Two times a day (BID) | ORAL | 1 refills | Status: DC

## 2022-08-26 MED ORDER — APIXABAN 5 MG PO TABS: 5.0000 mg | ORAL_TABLET | Freq: Two times a day (BID) | ORAL | 1 refills | Status: DC

## 2022-08-26 MED ORDER — METOPROLOL SUCCINATE ER 50 MG PO TB24: 150.0000 mg | ORAL_TABLET | Freq: Every day | ORAL | 0 refills | Status: DC

## 2022-08-26 MED ORDER — NITROGLYCERIN 0.4 MG SL SUBL: SUBLINGUAL_TABLET | SUBLINGUAL | 0 refills | Status: DC

## 2022-08-26 NOTE — Assessment & Plan Note (Signed)
Lungs are clear.  No respiratory distress.  Will test for COVID and flu given that he has a procedure coming up this Friday.  However, anticipate this will self resolve with time.

## 2022-08-26 NOTE — Assessment & Plan Note (Addendum)
Good rate control today on metoprolol succinate 150 mg.  On Eliquis.  Scheduled for DCCV on Friday.  Recent echo showed an EF of 35%.  Hopeful that this is just related to the A-fib and should recover after the DCCV.  Will monitor closely. -Continue metoprolol 150 daily, has A-fib clinic follow-up on 12/22

## 2022-08-26 NOTE — Progress Notes (Signed)
    SUBJECTIVE:   CHIEF COMPLAINT / HPI:   A Fib, initially with RVR, now rate controlled Diagnosed a few weeks ago here in clinic.  We have been incrementally increasing his metoprolol dose, currently he is on 150 of succinate daily.  He has had some shortness of breath throughout his illness course that continues today but is no worse than usual.  He is pleased that we have been able to keep him out of the ER/hospital.  He is scheduled for a DCCV with Dr. Stanford Breed on Friday of this week.  Of note, he has quit drinking alcohol over the past 3 weeks and is very pleased with this progress.  Cold Symptoms Head and chest congestion accompanied by cough.  No fevers.  No known sick contacts.  Present for about 3 days.  Has been taking some Mucinex with modest improvement in the cough.  Is vaccinated for COVID and flu.   OBJECTIVE:   BP 128/78   Pulse (!) 102   Ht 6' (1.829 m)   Wt 214 lb (97.1 kg)   SpO2 96%   BMI 29.02 kg/m   Physical Exam Constitutional:      General: He is not in acute distress. HENT:     Nose: Congestion present.     Mouth/Throat:     Mouth: Mucous membranes are moist.     Pharynx: Oropharynx is clear.  Cardiovascular:     Rate and Rhythm: Tachycardia present. Rhythm irregular.     Heart sounds: No murmur heard. Pulmonary:     Effort: Pulmonary effort is normal. No respiratory distress.     Breath sounds: Normal breath sounds. No wheezing or rales.  Musculoskeletal:        General: No swelling or deformity.  Lymphadenopathy:     Cervical: No cervical adenopathy.  Skin:    General: Skin is warm and dry.  Neurological:     General: No focal deficit present.      ASSESSMENT/PLAN:   Atrial fibrillation (HCC) Good rate control today on metoprolol succinate 150 mg.  On Eliquis.  Scheduled for DCCV on Friday.  Recent echo showed an EF of 35%.  Hopeful that this is just related to the A-fib and should recover after the DCCV.  Will monitor closely. -Continue  metoprolol 150 daily, has A-fib clinic follow-up on 12/22  Viral syndrome Lungs are clear.  No respiratory distress.  Will test for COVID and flu given that he has a procedure coming up this Friday.  However, anticipate this will self resolve with time.     Pearla Dubonnet, MD [place]

## 2022-08-26 NOTE — Patient Instructions (Addendum)
Corey Watts,  It is always great to see you!  I am testing you for COVID and flu given that you have the procedure on Friday.  Thankfully, because you are 3 days into symptoms as long as you are feeling better on Friday this should not preclude you from coming in for your procedure.  Your blood pressure and heart rate look pretty darn good today, I am happy with this.  Lets move forward with cardioversion on Friday.  If you develop sudden chest pain or worsening shortness of breath before then, you can always present to the emergency department.  Thank you for offering to help teach our residents.  We would love to see you at 2:30 on January 31 for about 2 hours for residents to practice their bedside heart ultrasound skills.  Just come into the clinic and let them know that you are here to be a sample patient for the resident education and our front desk staff will point you in the right direction.  I look forward to seeing you then!   Pearla Dubonnet, MD

## 2022-08-29 ENCOUNTER — Encounter (HOSPITAL_BASED_OUTPATIENT_CLINIC_OR_DEPARTMENT_OTHER): Payer: Self-pay | Admitting: Student

## 2022-08-29 LAB — COVID-19, FLU A+B NAA
Influenza A, NAA: NOT DETECTED
Influenza B, NAA: NOT DETECTED
SARS-CoV-2, NAA: NOT DETECTED

## 2022-08-30 ENCOUNTER — Ambulatory Visit (HOSPITAL_COMMUNITY): Payer: Medicare Other | Admitting: Anesthesiology

## 2022-08-30 ENCOUNTER — Ambulatory Visit (HOSPITAL_BASED_OUTPATIENT_CLINIC_OR_DEPARTMENT_OTHER): Payer: Medicare Other | Admitting: Anesthesiology

## 2022-08-30 ENCOUNTER — Encounter (HOSPITAL_COMMUNITY): Payer: Self-pay | Admitting: Cardiology

## 2022-08-30 ENCOUNTER — Encounter (HOSPITAL_COMMUNITY)
Admission: RE | Disposition: A | Discharge status: Home / Self Care | Payer: Self-pay | Source: Home / Self Care | Attending: Cardiology

## 2022-08-30 ENCOUNTER — Ambulatory Visit (HOSPITAL_COMMUNITY)
Admission: RE | Admit: 2022-08-30 | Discharge: 2022-08-30 | Disposition: A | Discharge status: Home / Self Care | Payer: Medicare Other | Attending: Cardiology | Admitting: Cardiology

## 2022-08-30 ENCOUNTER — Other Ambulatory Visit: Payer: Self-pay

## 2022-08-30 DIAGNOSIS — I10 Essential (primary) hypertension: Secondary | ICD-10-CM

## 2022-08-30 DIAGNOSIS — Z6829 Body mass index (BMI) 29.0-29.9, adult: Secondary | ICD-10-CM | POA: Diagnosis not present

## 2022-08-30 DIAGNOSIS — K219 Gastro-esophageal reflux disease without esophagitis: Secondary | ICD-10-CM | POA: Diagnosis not present

## 2022-08-30 DIAGNOSIS — I2510 Atherosclerotic heart disease of native coronary artery without angina pectoris: Secondary | ICD-10-CM | POA: Diagnosis not present

## 2022-08-30 DIAGNOSIS — I5022 Chronic systolic (congestive) heart failure: Secondary | ICD-10-CM | POA: Diagnosis not present

## 2022-08-30 DIAGNOSIS — I4819 Other persistent atrial fibrillation: Secondary | ICD-10-CM

## 2022-08-30 DIAGNOSIS — Z87891 Personal history of nicotine dependence: Secondary | ICD-10-CM | POA: Insufficient documentation

## 2022-08-30 DIAGNOSIS — I4891 Unspecified atrial fibrillation: Secondary | ICD-10-CM | POA: Diagnosis not present

## 2022-08-30 DIAGNOSIS — I110 Hypertensive heart disease with heart failure: Secondary | ICD-10-CM | POA: Diagnosis not present

## 2022-08-30 HISTORY — PX: CARDIOVERSION: SHX1299

## 2022-08-30 SURGERY — CARDIOVERSION
Anesthesia: General

## 2022-08-30 MED ORDER — PROPOFOL 10 MG/ML IV BOLUS
INTRAVENOUS | Status: DC | PRN
  Administered 2022-08-30: 70 mg via INTRAVENOUS

## 2022-08-30 MED ORDER — LIDOCAINE 2% (20 MG/ML) 5 ML SYRINGE
INTRAMUSCULAR | Status: DC | PRN
  Administered 2022-08-30: 60 mg via INTRAVENOUS

## 2022-08-30 MED ORDER — SODIUM CHLORIDE 0.9 % IV SOLN: INTRAVENOUS | Status: DC

## 2022-08-30 NOTE — H&P (Signed)
Office Visit 08/16/2022 [facility] [address] [facility] [place]. [facility]   H[place], [place] A, MD Cardiology Atrial fibrillation with rapid ventricular response ([place]) +3 more Dx Referred by Lind Covert, MD Reason for Visit   Additional Documentation  Vitals: BP 138/90 Important    Pulse 123 Important    Ht 6' (1.829 m)   Wt 97.5 kg   SpO2 95%   BMI 29.16 kg/m   BSA 2.23 m  Flowsheets: NEWS,   MEWS Score,   Anthropometrics,   CHADS2-VASc Score  Encounter Info: Billing Info,   History,   Allergies,   Detailed Report   All Notes   Progress Notes by Werner Lean, MD at 08/16/2022 9:40 AM  Author: Werner Lean, MD Author Type: Physician Filed: 08/16/2022 10:34 AM  Note Status: Signed Cosign: Cosign Not Required Encounter Date: 08/16/2022  Editor: Werner Lean, MD (Physician)             Expand All Collapse All  Cardiology Office Note:     Date:  08/16/2022    ID:  Kendrick Ranch Sr., DOB 08-Dec-1955, MRN [id number]   PCP:  Eppie Gibson, MD              [place] Prov[facility] Cardiologist:  Werner Lean, MD      Referring MD: Lind Covert, *    CC: SOB Consulted for the evaluation of atrial fibrillation at the behest of Dr. Erin Hearing   History of Present Illness:     Coby Shrewsberry. is a 66 y.o. male with a hx of Moderate non obstructive CAD in 3244 complicated by coronary vasospasm.  Alcohol use, atrial fibrillation, and morbid obesity. New Heart Failure with reduced EF.    Patient notes that he is feeling SOB for 2-3 months..   He has DOE and SOB rest.   He started eliquis one month prior with no issues.  No f/u Hgb.  No bleeding.   Has had rare chest pain that last 2-3 seconds.  Feels like a fast squeeze. Last occurred Wednesday of last week.  Can't do ADLs with out  SOB comes and goes..  No PND or orthopnea.  No weight gain, leg swelling , or  abdominal swelling.  He had had some near syncope with high rate. . Notes  no palpitations or funny heart beats (He is in A RVR) He also has vertigo wth a burst ear drum.rupture.   He has cut back his drinking but presently willing to consider to cut down to only a beer a day. Is pre-contemplative for complete cessation.  He has concern about taking heart medications.         Past Medical History:  Diagnosis Date   Acute hearing loss of left ear 03/12/2021   Allergy      Allergic rhinitis   Angina pectoris ([place]) 09/16/2004    Known coronary vasospasm -- treated with combination of long-acting nitrates and calcium channel blockers; NTG when necessary   CAD (coronary artery disease) 04/16/2005    Mild obstructive ramus intermedius disease (30-50%) and 50-60% proximal   Chronic back pain      Stemming from prior lumbar diskectomy   GERD (gastroesophageal reflux disease)     Hyperlipidemia     Hypertension     Obesity     Substance abuse ([place])      Known chronic alcoholic - not interested in quitting (current as  of 2014)           Past Surgical History:  Procedure Laterality Date   [place]    Chronic back pain stemming from this   CARDIOVASCULAR STRESS TEST   2005    Dr. Daneen Schick -- Cardiolite stress test showed no evidence of ischemia   COLONOSCOPY W/ BIOPSIES   2011    Multiple small polyps and s/p polypectomy -- recommended repeat in 2 years due to multiple small polyps   ESOPHAGEAL DILATION   1997      Current Medications: Active Medications      Current Meds  Medication Sig   allopurinol (ZYLOPRIM) 100 MG tablet TAKE 1 TABLET(100 MG) BY MOUTH DAILY   amLODipine (NORVASC) 10 MG tablet TAKE 1 TABLET(10 MG) BY MOUTH DAILY   apixaban (ELIQUIS) 5 MG TABS tablet Take 1 tablet (5 mg total) by mouth 2 (two) times daily.   busPIRone (BUSPAR) 30 MG tablet TAKE 1 TABLET(30 MG) BY MOUTH TWICE DAILY   cyclobenzaprine (FLEXERIL) 10 MG tablet Take 1 tablet (10 mg  total) by mouth 3 (three) times daily as needed for muscle spasms.   HYDROcodone-acetaminophen (NORCO/VICODIN) 5-325 MG tablet Take 1 tablet by mouth 2 (two) times daily as needed.   isosorbide mononitrate (IMDUR) 60 MG 24 hr tablet TAKE 1 TABLET(60 MG) BY MOUTH DAILY   losartan (COZAAR) 25 MG tablet Take 1 tablet (25 mg total) by mouth daily.   metoprolol succinate (TOPROL-XL) 50 MG 24 hr tablet Take 3 tablets (150 mg total) by mouth daily. Take with or immediately following a meal.   METOPROLOL SUCCINATE ER PO Take 150 mg by mouth daily at 6 (six) AM.   montelukast (SINGULAIR) 10 MG tablet TAKE 1 TABLET(10 MG) BY MOUTH AT BEDTIME   naloxone (NARCAN) nasal spray 4 mg/0.1 mL To be used only in case of suspected opioid overdose   nitroGLYCERIN (NITROSTAT) 0.4 MG SL tablet PLACE 1 TABLET UNDER THE TONGUE IF NEEDED FOR CHEST PAIN EVERY 5 MINUTES FOR 3 DOSES. IF NO RELIEF AFTER 3RD DOSE CALL MD OR 911   omeprazole (PRILOSEC) 20 MG capsule TAKE 1 CAPSULE(20 MG) BY MOUTH DAILY   pravastatin (PRAVACHOL) 40 MG tablet TAKE 1 TABLET(40 MG) BY MOUTH DAILY   pregabalin (LYRICA) 25 MG capsule Take 1 capsule (25 mg total) by mouth 2 (two) times daily.   [DISCONTINUED] aspirin 81 MG EC tablet Take 81 mg by mouth daily.        Allergies:   Diphenhydramine hcl and Zantac [ranitidine hcl]    Social History         Socioeconomic History   Marital status: Married      Spouse name: Hoyle Sauer   Number of children: 2   Years of education: 8   Highest education level: 8th grade  Occupational History   Occupation: Disabled   Tobacco Use   Smoking status: Former      Packs/day: 2.00      Types: Cigarettes      Passive exposure: Past   Smokeless tobacco: Former      Quit date: 1991   Tobacco comments:      2PPD smoker until 1991  Vaping Use   Vaping Use: Never used  Substance and Sexual Activity   Alcohol use: Yes      Alcohol/week: 3.0 standard drinks of alcohol      Types: 3 Cans of beer per week    Drug use: Not Currently  Sexual activity: Yes      Birth control/protection: Post-menopausal, Surgical  Other Topics Concern   Not on file  Social History Narrative    Patient lives with his wife Hoyle Sauer.     Patient has 2 adult children, with 6 grandchildren, 1 great grandchild.            Patients family lives all on the same street.     Patient enjoys being outside, fixing up his home, and spending time with his family.     Social Determinants of Health        Financial Resource Strain: Low Risk  (11/16/2020)    Overall Financial Resource Strain (CARDIA)     Difficulty of Paying Living Expenses: Not hard at all  Food Insecurity: No Food Insecurity (11/16/2020)    Hunger Vital Sign     Worried About Running Out of Food in the Last Year: Never true     Ran Out of Food in the Last Year: Never true  Transportation Needs: No Transportation Needs (11/16/2020)    PRAPARE - Armed forces logistics/support/administrative officer (Medical): No     Lack of Transportation (Non-Medical): No  Physical Activity: Inactive (11/16/2020)    Exercise Vital Sign     Days of Exercise per Week: 0 days     Minutes of Exercise per Session: 0 min  Stress: No Stress Concern Present (11/16/2020)    [place]     Feeling of Stress : Only a little  Social Connections: Moderately Isolated (11/16/2020)    Social Connection and Isolation Panel [NHANES]     Frequency of Communication with Friends and Family: More than three times a week     Frequency of Social Gatherings with Friends and Family: More than three times a week     Attends Religious Services: Never     Marine scientist or Organizations: No     Attends Music therapist: Never     Marital Status: Married      Family History: The patient's family history includes Heart disease in his father; Heart failure in his father and mother.   ROS:   Please see the history of present  illness.     All other systems reviewed and are negative.   EKGs/Labs/Other Studies Reviewed:     The following studies were reviewed today:   EKG:  EKG is  ordered today.  The ekg ordered today demonstrates  08/16/22: Afib RVR   Cardiac Studies & Procedures Objective      ECHOCARDIOGRAM   ECHOCARDIOGRAM COMPLETE 08/12/2022   Narrative ECHOCARDIOGRAM REPORT       Patient Name:   TRAPPER MEECH Sr. Date of Exam: 08/12/2022 Medical Rec #:  [id number]           Height:       72.0 in Accession #:    [id number]          Weight:       219.6 lb Date of Birth:  01-03-1956           BSA:          2.217 m Patient Age:    28 years            BP:           138/80 mmHg Patient Gender: M  HR:           139 bpm. Exam Location:  [place]   Procedure: 2D Echo, Cardiac Doppler and Color Doppler   Indications:    Atrial Fibrillation with rapid ventricular response I48.91   History:        Patient has no prior history of Echocardiogram examinations. CAD; Risk Factors:Hypertension and Dyslipidemia.   Sonographer:    Mikki Santee RDCS Referring Phys: Eppie Gibson   IMPRESSIONS     1. Left ventricular ejection fraction, by estimation, is 30 to 35%. The left ventricle has moderately decreased function. The left ventricle demonstrates global hypokinesis. Left ventricular diastolic parameters are indeterminate. 2. Right ventricular systolic function is mildly reduced. The right ventricular size is normal. There is mildly elevated pulmonary artery systolic pressure. The estimated right ventricular systolic pressure is 97.6 mmHg. 3. Left atrial size was mildly dilated. 4. Right atrial size was moderately dilated. 5. The mitral valve is normal in structure. Mild mitral valve regurgitation. No evidence of mitral stenosis. 6. The aortic valve is tricuspid. Aortic valve regurgitation is not visualized. No aortic stenosis is present. 7. Aortic dilatation noted. There is  mild dilatation of the ascending aorta, measuring 41 mm. 8. The inferior vena cava is dilated in size with <50% respiratory variability, suggesting right atrial pressure of 15 mmHg. 9. The patient is in rapid atrial fibrillation. Needs attention towards rate control and likely cardioversion. Need to reassess LV systolic function when in NSR.   FINDINGS Left Ventricle: Left ventricular ejection fraction, by estimation, is 30 to 35%. The left ventricle has moderately decreased function. The left ventricle demonstrates global hypokinesis. The left ventricular internal cavity size was normal in size. There is no left ventricular hypertrophy. Left ventricular diastolic parameters are indeterminate.   Right Ventricle: The right ventricular size is normal. No increase in right ventricular wall thickness. Right ventricular systolic function is mildly reduced. There is mildly elevated pulmonary artery systolic pressure. The tricuspid regurgitant velocity is 2.30 m/s, and with an assumed right atrial pressure of 15 mmHg, the estimated right ventricular systolic pressure is 73.4 mmHg.   Left Atrium: Left atrial size was mildly dilated.   Right Atrium: Right atrial size was moderately dilated.   Pericardium: There is no evidence of pericardial effusion.   Mitral Valve: The mitral valve is normal in structure. Mild mitral valve regurgitation. No evidence of mitral valve stenosis.   Tricuspid Valve: The tricuspid valve is normal in structure. Tricuspid valve regurgitation is mild.   Aortic Valve: The aortic valve is tricuspid. Aortic valve regurgitation is not visualized. No aortic stenosis is present.   Pulmonic Valve: The pulmonic valve was normal in structure. Pulmonic valve regurgitation is trivial.   Aorta: Aortic dilatation noted. There is mild dilatation of the ascending aorta, measuring 41 mm.   Venous: The inferior vena cava is dilated in size with less than 50% respiratory variability,  suggesting right atrial pressure of 15 mmHg.   IAS/Shunts: No atrial level shunt detected by color flow Doppler.     LEFT VENTRICLE PLAX 2D LVIDd:         5.40 cm LVIDs:         3.70 cm LV PW:         0.90 cm LV IVS:        0.80 cm LVOT diam:     2.10 cm LV SV:         39 LV SV Index:   18 LVOT Area:  3.46 cm     RIGHT VENTRICLE RV Basal diam:  3.90 cm RV Mid diam:    3.30 cm   LEFT ATRIUM             Index        RIGHT ATRIUM           Index LA diam:        3.40 cm 1.53 cm/m   RA Area:     25.20 cm LA Vol (A2C):   71.2 ml 32.12 ml/m  RA Volume:   80.30 ml  36.22 ml/m LA Vol (A4C):   69.6 ml 31.39 ml/m LA Biplane Vol: 73.4 ml 33.11 ml/m AORTIC VALVE LVOT Vmax:   81.20 cm/s LVOT Vmean:  53.267 cm/s LVOT VTI:    0.113 m   AORTA Ao Root diam: 3.80 cm Ao Asc diam:  4.10 cm   TRICUSPID VALVE TR Peak grad:   21.2 mmHg TR Vmax:        230.00 cm/s   SHUNTS Systemic VTI:  0.11 m Systemic Diam: 2.10 cm   Dalton McleanMD Electronically signed by Franki Monte Signature Date/Time: 08/12/2022/5:06:14 PM       Final                  Recent Labs: 12/04/2021: ALT 21; BUN 11; Creatinine, Ser 0.98; Hemoglobin 15.3; Platelets 213; Potassium 4.7; Sodium 143 07/19/2022: TSH 3.120  Recent Lipid Panel Labs (Brief)          Component Value Date/Time    CHOL 174 11/17/2019 1125    TRIG 244 (H) 11/17/2019 1125    HDL 35 (L) 11/17/2019 1125    CHOLHDL 5.0 11/17/2019 1125    CHOLHDL 3.9 02/14/2015 0955    VLDL 76 (H) 02/14/2015 0955    LDLCALC 97 11/17/2019 1125          Risk Assessment/Calculations:     CHA2DS2-VASc Score = 4   This indicates a 4.8% annual risk of stroke. The patient's score is based upon: CHF History: 1 HTN History: 1 Diabetes History: 0 Stroke History: 0 Vascular Disease History: 1 Age Score: 1 Gender Score: 0             Physical Exam:     VS:  BP (!) 138/90   Pulse (!) 123   Ht 6' (1.829 m)   Wt 215 lb (97.5 kg)    SpO2 95%   BMI 29.16 kg/m         Wt Readings from Last 3 Encounters:  08/16/22 215 lb (97.5 kg)  08/02/22 219 lb 9.6 oz (99.6 kg)  07/19/22 215 lb (97.5 kg)      GEN: No distress HEENT: Normal NECK: No JVD; No carotid bruits LYMPHATICS: No lymphadenopathy CARDIAC: IRIR tachycardia, no murmurs, rubs, gallops RESPIRATORY:  Clear to auscultation without rales, wheezing or rhonchi  ABDOMEN: Soft, non-tender, non-distended MUSCULOSKELETAL:  No edema; No deformity  SKIN: Warm and dry NEUROLOGIC:  Alert and oriented x 3 PSYCHIATRIC:  Normal affect    ASSESSMENT:     1. Atrial fibrillation with rapid ventricular response (HCC)   2. HFrEF (heart failure with reduced ejection fraction) ([place])   3. Essential hypertension, benign   4. Atherosclerosis of native coronary artery of native heart without angina pectoris     PLAN:     Heart Failure Reduced Ejection Fraction  Moderate non obstructive CAD - NYHA class III, Stage C, euvolemic, etiology from AF or alcohol suspected - Diuretic regimen:  appears euvolemic   - Discussed the importance of fluid restriction of < 2 L, salt restriction, and checking daily weights  - will get BMP - discussed alcohol cesstion   - continue succinate 150 mg; will not further increase - adding low dose ARB; discussed at length GDMT but he is skeptical - deferring MRA and SGLT2i start now   - no PRN nitro needed; continue IMDUR - not amenable to increase statin trial presently, continue pravastatin   Paroxsymal Atrial Fibrillation,  - CHADSVASC=4. - Continue anticoagulation with eliquis; Acquired Thrombophilia - discussed alcohol, discussed exercise as preventive factors - Considerations for DCCV; Consent for DCCV then f/u with AF clinic; I have discussed risk and benefits and that continuing to drink increases risk of failed DCCV. - CBC today   Will get DCCV, then will try to add GDMT; needs post DCCV A fib cliic eval; then me or APP in ~ 3  months; will consider GDMT again and alcohol cessation          Shared Decision Making/Informed Consent The risks (stroke, cardiac arrhythmias rarely resulting in the need for a temporary or permanent pacemaker, skin irritation or burns and complications associated with conscious sedation including aspiration, arrhythmia, respiratory failure and death), benefits (restoration of normal sinus rhythm) and alternatives of a direct current cardioversion were explained in detail to Mr. Knierim and he agrees to proceed.        Medication Adjustments/Labs and Tests Ordered: Current medicines are reviewed at length with the patient today.  Concerns regarding medicines are outlined above.     Orders Placed This Encounter  Procedures   CBC   Basic metabolic panel   Amb Referral to AFIB Clinic   EKG 12-Lead        Meds ordered this encounter  Medications   losartan (COZAAR) 25 MG tablet      Sig: Take 1 tablet (25 mg total) by mouth daily.      Dispense:  90 tablet      Refill:  3      Patient Instructions  Medication Instructions:  Your physician has recommended you make the following change in your medication:  START: losartan 25 mg by mouth once daily   PLEASE: DO NOT miss any doses of your Eliquis if you do please call our office to let us know.    *If you need a refill on your cardiac medications before your next appointment, please call your pharmacy*     Lab Work: TODAY: CBC, BMP   If you have labs (blood work) drawn today and your tests are completely normal, you will receive your results only by: [place] (if you have MyChart) OR A paper copy in the mail If you have any lab test that is abnormal or we need to change your treatment, we will call you to review the results.     Testing/Procedures: Your physician has recommended that you have a Cardioversion (DCCV). Electrical Cardioversion uses a jolt of electricity to your heart either through paddles or wired patches  attached to your chest. This is a controlled, usually prescheduled, procedure. Defibrillation is done under light anesthesia in the hospital, and you usually go home the day of the procedure. This is done to get your heart back into a normal rhythm. You are not awake for the procedure. Please see the instruction sheet given to you today.      Your physician has referred you to see the [facility] after your  08/30/22 Cardioversion.    Follow-Up: At [facility], you and your health needs are our priority.  As part of our continuing mission to provide you with exceptional heart care, we have created designated Provider Care Teams.  These Care Teams include your primary Cardiologist (physician) and Advanced Practice Providers (APPs -  Physician Assistants and Nurse Practitioners) who all work together to provide you with the care you need, when you need it.   We recommend signing up for the patient portal called "MyChart".  Sign up information is provided on this After Visit Summary.  MyChart is used to connect with patients for Virtual Visits (Telemedicine).  Patients are able to view lab/test results, encounter notes, upcoming appointments, etc.  Non-urgent messages can be sent to your provider as well.   To learn more about what you can do with MyChart, go to [email or web address].     Your next appointment:   3-4 month(s)   The format for your next appointment:   In Person   Provider:   Werner Lean, MD      Other Instructions   You are scheduled for a Cardioversion on Friday, December 15 with Dr. Stanford Breed.  Please arrive at the [facility] (Main Entrance A) at [facility]: [address] [place], [place] [address] at 8:00 am.    DIET:  Nothing to eat or drink after midnight except a sip of water with medications (see medication instructions below)   MEDICATION INSTRUCTIONS:       Continue taking your anticoagulant (blood thinner): Apixaban  (Eliquis).  You will need to continue this after your procedure until you are told by your provider that it is safe to stop.     LABS: Your labs will be done at the hospital prior to your procedure - you will need to arrive 1 and 1/2 hours prior to your procedure.   FYI:  For your safety, and to allow [place] to monitor your vital signs accurately during the surgery/procedure we request: If you have artificial nails, gel coating, SNS etc, please have those removed prior to your surgery/procedure. Not having the nail coverings /polish removed may result in cancellation or delay of your surgery/procedure.   You must have a responsible person to drive you home and stay in the waiting area during your procedure. Failure to do so could result in cancellation.   Bring your insurance cards.   *Special Note: Every effort is made to have your procedure done on time. Occasionally there are emergencies that occur at the hospital that may cause delays. Please be patient if a delay does occur.        Important Information About Sugar            Signed, Werner Lean, MD  08/16/2022 10:34 AM    [facility]       For DCCV; compliant with apixaban; no changes [name]

## 2022-08-30 NOTE — Anesthesia Postprocedure Evaluation (Signed)
Anesthesia Post Note  Patient: Corey OKELLEY Sr.  Procedure(s) Performed: CARDIOVERSION     Patient location during evaluation: PACU Anesthesia Type: General Level of consciousness: awake and alert Pain management: pain level controlled Vital Signs Assessment: post-procedure vital signs reviewed and stable Respiratory status: spontaneous breathing, nonlabored ventilation, respiratory function stable and patient connected to nasal cannula oxygen Cardiovascular status: blood pressure returned to baseline and stable Postop Assessment: no apparent nausea or vomiting Anesthetic complications: no   No notable events documented.  Last Vitals:  Vitals:   08/30/22 0801 08/30/22 0808  BP: 120/78 132/82  Pulse: 62 64  Resp: (!) 22 (!) 23  Temp:    SpO2: 95% 98%    Last Pain:  Vitals:   08/30/22 0808  TempSrc:   PainSc: 0-No pain                 Zared Knoth S

## 2022-08-30 NOTE — Transfer of Care (Signed)
Immediate Anesthesia Transfer of Care Note  Patient: Corey CAN Sr.  Procedure(s) Performed: CARDIOVERSION  Patient Location: PACU and Endoscopy Unit  Anesthesia Type:General  Level of Consciousness: drowsy, patient cooperative, and responds to stimulation  Airway & Oxygen Therapy: Patient Spontanous Breathing  Post-op Assessment: Report given to RN and Post -op Vital signs reviewed and stable  Post vital signs: Reviewed and stable  Last Vitals:  Vitals Value Taken Time  BP    Temp    Pulse    Resp    SpO2      Last Pain:  Vitals:   08/30/22 0709  TempSrc: Temporal  PainSc: 0-No pain         Complications: No notable events documented.

## 2022-08-30 NOTE — Interval H&P Note (Signed)
History and Physical Interval Note:  08/30/2022 7:26 AM  Corey Ranch Sr.  has presented today for surgery, with the diagnosis of AFIB.  The various methods of treatment have been discussed with the patient and family. After consideration of risks, benefits and other options for treatment, the patient has consented to  Procedure(s): CARDIOVERSION (N/A) as a surgical intervention.  The patient's history has been reviewed, patient examined, no change in status, stable for surgery.  I have reviewed the patient's chart and labs.  Questions were answered to the patient's satisfaction.     [name]

## 2022-08-30 NOTE — Discharge Instructions (Signed)

## 2022-08-30 NOTE — Anesthesia Procedure Notes (Signed)
Procedure Name: General with mask airway Date/Time: 08/30/2022 7:36 AM  Performed by: Janace Litten, CRNAPre-anesthesia Checklist: Patient identified, Emergency Drugs available, Suction available, Patient being monitored and Timeout performed Patient Re-evaluated:Patient Re-evaluated prior to induction Oxygen Delivery Method: Ambu bag Preoxygenation: Pre-oxygenation with 100% oxygen Induction Type: IV induction

## 2022-08-30 NOTE — Anesthesia Preprocedure Evaluation (Signed)
Anesthesia Evaluation  Patient identified by MRN, date of birth, ID band Patient awake    Reviewed: Allergy & Precautions, H&P , NPO status , Patient's Chart, lab work & pertinent test results  Airway Mallampati: II   Neck ROM: full    Dental   Pulmonary former smoker   breath sounds clear to auscultation       Cardiovascular hypertension, + CAD  + dysrhythmias Atrial Fibrillation  Rhythm:irregular Rate:Normal  TTE (08/12/22): EF 35%   Neuro/Psych    GI/Hepatic ,GERD  ,,(+)     substance abuse  alcohol use  Endo/Other    Renal/GU      Musculoskeletal   Abdominal   Peds  Hematology   Anesthesia Other Findings   Reproductive/Obstetrics                             Anesthesia Physical Anesthesia Plan  ASA: 3  Anesthesia Plan: General   Post-op Pain Management:    Induction: Intravenous  PONV Risk Score and Plan: 2 and Propofol infusion and Treatment may vary due to age or medical condition  Airway Management Planned: Mask  Additional Equipment:   Intra-op Plan:   Post-operative Plan:   Informed Consent: I have reviewed the patients History and Physical, chart, labs and discussed the procedure including the risks, benefits and alternatives for the proposed anesthesia with the patient or authorized representative who has indicated his/her understanding and acceptance.     Dental advisory given  Plan Discussed with: CRNA, Anesthesiologist and Surgeon  Anesthesia Plan Comments:        Anesthesia Quick Evaluation

## 2022-08-30 NOTE — Procedures (Signed)
Electrical Cardioversion Procedure Note Corey Watts [id number] Oct 02, 1955  Procedure: Electrical Cardioversion Indications:  Atrial Fibrillation  Procedure Details Consent: Risks of procedure as well as the alternatives and risks of each were explained to the (patient/caregiver).  Consent for procedure obtained. Time Out: Verified patient identification, verified procedure, site/side was marked, verified correct patient position, special equipment/implants available, medications/allergies/relevent history reviewed, required imaging and test results available.  Performed  Patient placed on cardiac monitor, pulse oximetry, supplemental oxygen as necessary.  Sedation given:  Pt sedated by anesthesia with lidocaine 60 mg and diprovan 70 mg IV. Pacer pads placed anterior and posterior chest.  Cardioverted 1 time(s).  Cardioverted at [place].  Evaluation Findings: Post procedure EKG shows: NSR Complications: None Patient did tolerate procedure well.   Corey Watts 08/30/2022, 7:24 AM

## 2022-09-02 ENCOUNTER — Encounter (HOSPITAL_COMMUNITY): Payer: Self-pay | Admitting: Cardiology

## 2022-09-06 ENCOUNTER — Ambulatory Visit (HOSPITAL_COMMUNITY)
Admission: RE | Admit: 2022-09-06 | Discharge: 2022-09-06 | Disposition: A | Discharge status: Home / Self Care | Payer: Medicare Other | Source: Ambulatory Visit | Attending: Nurse Practitioner | Admitting: Nurse Practitioner

## 2022-09-06 VITALS — BP 142/88 | HR 59 | Ht 72.01 in | Wt 209.0 lb

## 2022-09-06 DIAGNOSIS — R9431 Abnormal electrocardiogram [ECG] [EKG]: Secondary | ICD-10-CM | POA: Diagnosis not present

## 2022-09-06 DIAGNOSIS — E669 Obesity, unspecified: Secondary | ICD-10-CM | POA: Diagnosis not present

## 2022-09-06 DIAGNOSIS — D6869 Other thrombophilia: Secondary | ICD-10-CM

## 2022-09-06 DIAGNOSIS — Z87891 Personal history of nicotine dependence: Secondary | ICD-10-CM | POA: Diagnosis not present

## 2022-09-06 DIAGNOSIS — I4891 Unspecified atrial fibrillation: Secondary | ICD-10-CM

## 2022-09-06 DIAGNOSIS — I110 Hypertensive heart disease with heart failure: Secondary | ICD-10-CM | POA: Diagnosis not present

## 2022-09-06 DIAGNOSIS — I5020 Unspecified systolic (congestive) heart failure: Secondary | ICD-10-CM | POA: Insufficient documentation

## 2022-09-06 DIAGNOSIS — Z79899 Other long term (current) drug therapy: Secondary | ICD-10-CM | POA: Insufficient documentation

## 2022-09-06 DIAGNOSIS — Z7901 Long term (current) use of anticoagulants: Secondary | ICD-10-CM | POA: Insufficient documentation

## 2022-09-06 DIAGNOSIS — I501 Left ventricular failure: Secondary | ICD-10-CM | POA: Insufficient documentation

## 2022-09-06 DIAGNOSIS — R001 Bradycardia, unspecified: Secondary | ICD-10-CM | POA: Diagnosis not present

## 2022-09-06 NOTE — Progress Notes (Signed)
Primary Care Physician: Eppie Gibson, MD Referring Physician: Dr. Tula Nakayama Sr. is a 66 y.o. male with a h/o moderate non obstructive CAD in 0630 complicated by coronary vasospasm, alcohol use, new onset  atrial fibrillation, and obesity, as well as new heart failure with reduced EF.  He was seen by Dr. Julieanne Manson 08/16/22 with c/o of exertional dyspnea and was in afib with RVR. He had a cardioversion 12/15 which was successful. He is now in the afib clinic for f/u . EKG today shows SR. He states that he feels improved but still at times feels short of breath. EF by echo performed while  in afib with RVR was 30-35%. He has given up alcohol and lost 17 lbs over the last 2 weeks. Since giving up alcohol and weight loss, his wife states that he is no longer snoring or having apnea spells. He is complaint with anticoagulation. Minimal caffeine and no tobacco.   Today, he denies symptoms of palpitations, chest pain, shortness of breath, orthopnea, PND, lower extremity edema, dizziness, presyncope, syncope, or neurologic sequela. The patient is tolerating medications without difficulties and is otherwise without complaint today.   Past Medical History:  Diagnosis Date   Acute hearing loss of left ear 03/12/2021   Allergy    Allergic rhinitis   Angina pectoris ([place]) 09/16/2004   Known coronary vasospasm -- treated with combination of long-acting nitrates and calcium channel blockers; NTG when necessary   CAD (coronary artery disease) 04/16/2005   Mild obstructive ramus intermedius disease (30-50%) and 50-60% proximal   Chronic back pain    Stemming from prior lumbar diskectomy   GERD (gastroesophageal reflux disease)    Hyperlipidemia    Hypertension    Obesity    Substance abuse ([place])    Known chronic alcoholic - not interested in quitting (current as of 2014)   Past Surgical History:  Procedure Laterality Date   [place]   Chronic back pain  stemming from this   [place]  2005   Dr. Daneen Schick -- Cardiolite stress test showed no evidence of ischemia   CARDIOVERSION N/A 08/30/2022   Procedure: CARDIOVERSION;  Surgeon: Lelon Perla, MD;  Location: [facility] ENDOSCOPY;  Service: Cardiovascular;  Laterality: N/A;   COLONOSCOPY W/ BIOPSIES  2011   Multiple small polyps and s/p polypectomy -- recommended repeat in 2 years due to multiple small polyps   ESOPHAGEAL DILATION  1997    Current Outpatient Medications  Medication Sig Dispense Refill   acetaminophen (TYLENOL) 500 MG tablet Take 500 mg by mouth daily.     acetaminophen (TYLENOL) 650 MG CR tablet Take 650 mg by mouth at bedtime.     allopurinol (ZYLOPRIM) 100 MG tablet TAKE 1 TABLET(100 MG) BY MOUTH DAILY 90 tablet 2   amLODipine (NORVASC) 10 MG tablet TAKE 1 TABLET(10 MG) BY MOUTH DAILY 90 tablet 3   apixaban (ELIQUIS) 5 MG TABS tablet Take 1 tablet (5 mg total) by mouth 2 (two) times daily. 60 tablet 1   busPIRone (BUSPAR) 30 MG tablet TAKE 1 TABLET(30 MG) BY MOUTH TWICE DAILY 180 tablet 3   cyclobenzaprine (FLEXERIL) 10 MG tablet Take 1 tablet (10 mg total) by mouth 3 (three) times daily as needed for muscle spasms. 30 tablet 0   HYDROcodone-acetaminophen (NORCO/VICODIN) 5-325 MG tablet Take 1 tablet by mouth 2 (two) times daily as needed. 60 tablet 0   isosorbide mononitrate (IMDUR) 60 MG 24  hr tablet TAKE 1 TABLET(60 MG) BY MOUTH DAILY 90 tablet 3   losartan (COZAAR) 25 MG tablet Take 1 tablet (25 mg total) by mouth daily. 90 tablet 3   metoprolol succinate (TOPROL-XL) 50 MG 24 hr tablet Take 3 tablets (150 mg total) by mouth daily at 6 (six) AM. 90 tablet 0   montelukast (SINGULAIR) 10 MG tablet TAKE 1 TABLET(10 MG) BY MOUTH AT BEDTIME 90 tablet 2   Multiple Vitamin (MULTIVITAMIN WITH MINERALS) TABS tablet Take 1 tablet by mouth daily.     naloxone (NARCAN) nasal spray 4 mg/0.1 mL To be used only in case of suspected opioid overdose 1 each 0    nitroGLYCERIN (NITROSTAT) 0.4 MG SL tablet PLACE 1 TABLET UNDER THE TONGUE IF NEEDED FOR CHEST PAIN EVERY 5 MINUTES FOR 3 DOSES. IF NO RELIEF AFTER 3RD DOSE CALL MD OR 911 25 tablet 0   omeprazole (PRILOSEC) 20 MG capsule TAKE 1 CAPSULE(20 MG) BY MOUTH DAILY 90 capsule 3   OVER THE COUNTER MEDICATION Apply 1 Application topically at bedtime as needed (pain). CBD lotion     pravastatin (PRAVACHOL) 40 MG tablet TAKE 1 TABLET(40 MG) BY MOUTH DAILY 90 tablet 3   pregabalin (LYRICA) 25 MG capsule Take 1 capsule (25 mg total) by mouth 2 (two) times daily. 60 capsule 1   No current facility-administered medications for this encounter.    Allergies  Allergen Reactions   Diphenhydramine Hcl     Facial swelling   Zantac [Ranitidine Hcl]     Hiccups for 3-4 days    Social History   Socioeconomic History   Marital status: Married    Spouse name: Hoyle Sauer   Number of children: 2   Years of education: 8   Highest education level: 8th grade  Occupational History   Occupation: Disabled   Tobacco Use   Smoking status: Former    Packs/day: 2.00    Types: Cigarettes    Passive exposure: Past   Smokeless tobacco: Former    Quit date: 1991   Tobacco comments:    2PPD smoker until 1991  Vaping Use   Vaping Use: Never used  Substance and Sexual Activity   Alcohol use: Yes    Alcohol/week: 3.0 standard drinks of alcohol    Types: 3 Cans of beer per week   Drug use: Not Currently   Sexual activity: Yes  Other Topics Concern   Not on file  Social History Narrative   Patient lives with his wife Hoyle Sauer.    Patient has 2 adult children, with 6 grandchildren, 1 great grandchild.    Patients family lives all on the same street.    Patient enjoys being outside, fixing up his home, and spending time with his family.    Social Determinants of Health   Financial Resource Strain: Low Risk  (11/16/2020)   Overall Financial Resource Strain (CARDIA)    Difficulty of Paying Living Expenses: Not hard at  all  Food Insecurity: No Food Insecurity (11/16/2020)   Hunger Vital Sign    Worried About Running Out of Food in the Last Year: Never true    Ran Out of Food in the Last Year: Never true  Transportation Needs: No Transportation Needs (11/16/2020)   PRAPARE - Hydrologist (Medical): No    Lack of Transportation (Non-Medical): No  Physical Activity: Inactive (11/16/2020)   Exercise Vital Sign    Days of Exercise per Week: 0 days    Minutes  of Exercise per Session: 0 min  Stress: No Stress Concern Present (11/16/2020)   [place]    Feeling of Stress : Only a little  Social Connections: Moderately Isolated (11/16/2020)   Social Connection and Isolation Panel [NHANES]    Frequency of Communication with Friends and Family: More than three times a week    Frequency of Social Gatherings with Friends and Family: More than three times a week    Attends Religious Services: Never    Marine scientist or Organizations: No    Attends Archivist Meetings: Never    Marital Status: Married  Human resources officer Violence: Not At Risk (11/16/2020)   Humiliation, Afraid, Rape, and Kick questionnaire    Fear of Current or Ex-Partner: No    Emotionally Abused: No    Physically Abused: No    Sexually Abused: No    Family History  Problem Relation Age of Onset   Heart failure Mother    Heart failure Father    Heart disease Father     ROS- All systems are reviewed and negative except as per the HPI above  Physical Exam: Vitals:   09/06/22 0826  Height: 6' 0.01" (1.829 m)   Wt Readings from Last 3 Encounters:  08/30/22 97.5 kg  08/26/22 97.1 kg  08/16/22 97.5 kg    Labs: Lab Results  Component Value Date   NA 141 08/16/2022   K 4.2 08/16/2022   CL 101 08/16/2022   CO2 23 08/16/2022   GLUCOSE 86 08/16/2022   BUN 15 08/16/2022   CREATININE 0.99 08/16/2022   CALCIUM 9.8 08/16/2022   No  results found for: "INR" Lab Results  Component Value Date   CHOL 174 11/17/2019   HDL 35 (L) 11/17/2019   LDLCALC 97 11/17/2019   TRIG 244 (H) 11/17/2019     GEN- The patient is well appearing, alert and oriented x 3 today.   Head- normocephalic, atraumatic Eyes-  Sclera clear, conjunctiva pink Ears- hearing intact Oropharynx- clear Neck- supple, no JVP Lymph- no cervical lymphadenopathy Lungs- Clear to ausculation bilaterally, normal work of breathing Heart- Regular rate and rhythm, no murmurs, rubs or gallops, PMI not laterally displaced GI- soft, NT, ND, + BS Extremities- no clubbing, cyanosis, or edema MS- no significant deformity or atrophy Skin- no rash or lesion Psych- euthymic mood, full affect Neuro- strength and sensation are intact  EKG-Vent. rate 59 BPM PR interval 120 ms QRS duration 110 ms QT/QTcB 426/421 ms P-R-T axes 97 47 186 Sinus bradycardia ST & T wave abnormality, consider anterior ischemia Abnormal ECG When compared with ECG of 30-Aug-2022 07:48, PREVIOUS ECG IS PRESENT  -1. Left ventricular ejection fraction, by estimation, is 30 to 35%. The  left ventricle has moderately decreased function. The left ventricle  demonstrates global hypokinesis. Left ventricular diastolic parameters are  indeterminate.   2. Right ventricular systolic function is mildly reduced. The right  ventricular size is normal. There is mildly elevated pulmonary artery  systolic pressure. The estimated right ventricular systolic pressure is  44.3 mmHg.   3. Left atrial size was mildly dilated.   4. Right atrial size was moderately dilated.   5. The mitral valve is normal in structure. Mild mitral valve  regurgitation. No evidence of mitral stenosis.   6. The aortic valve is tricuspid. Aortic valve regurgitation is not  visualized. No aortic stenosis is present.   7. Aortic dilatation noted. There is mild dilatation  of the ascending  aorta, measuring 41 mm.   8. The  inferior vena cava is dilated in size with <50% respiratory  variability, suggesting right atrial pressure of 15 mmHg.   9. The patient is in rapid atrial fibrillation. Needs attention towards  rate control and likely cardioversion. Need to reassess LV systolic  function when in NSR.     Assessment and Plan:  1. New onset afib Successful cardioversion and is in SR today Congratulated on alcohol cessation Continue metoprolol  succinate 50 mg (3) tabs daily   2. CHA2DS2/VAS  score of 4 Continue eliquis 5 mg bid   3. HTN Stable   4. LV dysfunction Discussed importance of maintaining  SR with  LV dysfunction Discussed means of tracking  for afib at home If ERAF, will need to be considered for abaltion vrs tikosyn  Will need repeat echo in SR in a few months  to see if EF normalizes   F/u with Dr. Gasper Sells as scheduled 12/06/22 Afib clinic as needed   Butch Penny C. Alexa Blish, [facility] [address] [place], [place] [address] [phone]

## 2022-09-10 ENCOUNTER — Other Ambulatory Visit: Payer: Self-pay | Admitting: Student

## 2022-10-07 ENCOUNTER — Ambulatory Visit (INDEPENDENT_AMBULATORY_CARE_PROVIDER_SITE_OTHER): Payer: Medicare Other | Admitting: Student

## 2022-10-07 ENCOUNTER — Encounter: Payer: Self-pay | Admitting: Student

## 2022-10-07 VITALS — BP 136/60 | HR 57 | Temp 98.6°F | Ht 72.0 in | Wt 211.8 lb

## 2022-10-07 DIAGNOSIS — I4891 Unspecified atrial fibrillation: Secondary | ICD-10-CM

## 2022-10-07 DIAGNOSIS — H7291 Unspecified perforation of tympanic membrane, right ear: Secondary | ICD-10-CM

## 2022-10-07 DIAGNOSIS — M7742 Metatarsalgia, left foot: Secondary | ICD-10-CM

## 2022-10-07 DIAGNOSIS — Z9189 Other specified personal risk factors, not elsewhere classified: Secondary | ICD-10-CM

## 2022-10-07 DIAGNOSIS — M7741 Metatarsalgia, right foot: Secondary | ICD-10-CM

## 2022-10-07 DIAGNOSIS — G8929 Other chronic pain: Secondary | ICD-10-CM

## 2022-10-07 DIAGNOSIS — E781 Pure hyperglyceridemia: Secondary | ICD-10-CM

## 2022-10-07 DIAGNOSIS — M545 Low back pain, unspecified: Secondary | ICD-10-CM | POA: Diagnosis not present

## 2022-10-07 DIAGNOSIS — I5020 Unspecified systolic (congestive) heart failure: Secondary | ICD-10-CM | POA: Diagnosis not present

## 2022-10-07 DIAGNOSIS — I4819 Other persistent atrial fibrillation: Secondary | ICD-10-CM

## 2022-10-07 MED ORDER — HYDROCODONE-ACETAMINOPHEN 5-325 MG PO TABS: 1.0000 | ORAL_TABLET | Freq: Two times a day (BID) | ORAL | 0 refills | Status: DC | PRN

## 2022-10-07 MED ORDER — METOPROLOL SUCCINATE ER 50 MG PO TB24: 50.0000 mg | ORAL_TABLET | Freq: Every day | ORAL | 2 refills | Status: DC

## 2022-10-07 MED ORDER — EMPAGLIFLOZIN 10 MG PO TABS: 10.0000 mg | ORAL_TABLET | Freq: Every day | ORAL | 3 refills | Status: DC

## 2022-10-07 NOTE — Assessment & Plan Note (Signed)
Will discontinue Singulair and Flexeril at this time.  If he so chooses, would be happy to discontinue BuSpar and omeprazole next.  Will defer continuation versus discontinuation of his Imdur to his cardiologist.  He can discuss this with them at their next visit.  It seems like he was placed on this as an antianginal about the time of his CAD diagnosis in 2006.  No anginal symptoms recently.

## 2022-10-07 NOTE — Assessment & Plan Note (Signed)
Stable compared to my previous exam. Still no evidence of infection but does not seem to be healing spontaneously.  - ENT f/u in March

## 2022-10-07 NOTE — Assessment & Plan Note (Signed)
Much improved on Lyrica. Continue '25mg'$  BID.

## 2022-10-07 NOTE — Assessment & Plan Note (Addendum)
Regular rhythm in clinic today. HR bradycardic, per history sounds like he is remaining in the 50s on his high dose metoprolol XL ('150mg'$ /day). Suspect this is causing his activity intolerance.  - Continue Eliquis, CHADS2VASc 4 - Decrease Toprol XL to '50mg'$  daily - Ongoing follow-up with cardiology

## 2022-10-07 NOTE — Patient Instructions (Addendum)
Mr. Corey Watts, Corey Watts stop your singulair in an effort to get you on fewer medications. Talk to your cardiologist about whether they think you need to continue taking the isosorbide. The next drug we can think about pulling off would be Prilosec.  I am ordering a new medication called Jardiance as I think it will benefit your heart and your blood sugar levels.  Decrease your Metoprolol to '50mg'$  daily.  We will see you next week 2:30-4! You can get here around 2 to get set up.   Pearla Dubonnet, MD

## 2022-10-07 NOTE — Progress Notes (Signed)
SUBJECTIVE:   CHIEF COMPLAINT / HPI:   A Fib Follow-up Incidentally found to be in A-fib and a recent clinic visit with me.  Was not a rapid ventricular response at the time.  We had titrated up his metoprolol and started him on Eliquis at that time.  Got him plugged in with cardiology and they cardioverted him on 12/15. Echo showed EF 30-35%, thought to be in the setting of A fib RVR.  Currently on Eliquis, Amlodipine, Imdur, Losartan, Metoprolol.  Feeling generally well since his cardioversion, though finds he has hard time with exertional activities due to shortness of breath. His wife is a CNA and has been checking his HR regularly. It is usually in the 81s. He does have some shortness of breath at rest but this is better than it was when he was in A Fib with RVR.  No chest pain or LE edema.  He is pleased to share that he has entirely quit drinking!  TM Rupture Diagnosed by me in November. Thought 2/2 Q tip use. No evidence of infection at the time. Remains with diminished hearing on that side. Not painful but he does occasionally have a "popping" sensation, especially when he lies on that side.  Referral made to ENT in November but appt not until March.   Metatarsalgia Much improved on Lyrica.   Polypharmacy He desires to cut back on the number of meds he takes daily.  We discussed there are 2 groups of medications (those that keep him alive and close that he can feel better).  He feels that he is getting minimal benefit from his Singulair and Flexeril at this time and would be happy to discontinue those.  He feels that he is getting great benefit from his BuSpar and Prilosec and would like to continue for now.  OBJECTIVE:   BP 136/60   Pulse (!) 57   Temp 98.6 F (37 C)   Ht 6' (1.829 m)   Wt 211 lb 12.8 oz (96.1 kg)   SpO2 93%   BMI 28.73 kg/m   Gen: Awake, alert, NAD Ears: L canal with cerumen burden but TM clear, R TM remains with anterior-inferior perforation without  evidence of underlying infection. Hearing is diminished on that side.  Cardio: Bradycardic, regular, no murmurs, rubs, or gallops Pulm: Normal WOB on RA, good air movement, coarse in bases MSK: BLE without edema or deformity    ASSESSMENT/PLAN:   Atrial fibrillation (HCC) Regular rhythm in clinic today. HR bradycardic, per history sounds like he is remaining in the 50s on his high dose metoprolol XL ('150mg'$ /day). Suspect this is causing his activity intolerance.  - Continue Eliquis, CHADS2VASc 4 - Decrease Toprol XL to '50mg'$  daily - Ongoing follow-up with cardiology  HFrEF (heart failure with reduced ejection fraction) (HCC) EF 35-40%, thought secondary to A fib, though he does also have a history of CAD diagnosed during cath in 2006. No evidence of frank volume overload on exam today, but consider that this may be contributing to his SOB (though I think his bradycardia is the primary driver here). Adding Jardiance will offer a modest diuretic effect.  - Continue Losartan, metoprolol - Will add Jardiance, work toward [place] - Repeat echo per cardiology - Is on pravastatin for CAD, will repeat lipid panel today    Perforation of right tympanic membrane Stable compared to my previous exam. Still no evidence of infection but does not seem to be healing spontaneously.  - ENT f/u in  March  Metatarsalgia Much improved on Lyrica. Continue '25mg'$  BID.   At risk for polypharmacy Will discontinue Singulair and Flexeril at this time.  If he so chooses, would be happy to discontinue BuSpar and omeprazole next.  Will defer continuation versus discontinuation of his Imdur to his cardiologist.  He can discuss this with them at their next visit.  It seems like he was placed on this as an antianginal about the time of his CAD diagnosis in 2006.  No anginal symptoms recently.     Pearla Dubonnet, MD [place]

## 2022-10-07 NOTE — Assessment & Plan Note (Addendum)
EF 35-40%, thought secondary to A fib, though he does also have a history of CAD diagnosed during cath in 2006. No evidence of frank volume overload on exam today, but consider that this may be contributing to his SOB (though I think his bradycardia is the primary driver here). Adding Jardiance will offer a modest diuretic effect.  - Continue Losartan, metoprolol - Will add Jardiance, work toward [place] - Repeat echo per cardiology - Is on pravastatin for CAD, will repeat lipid panel today

## 2022-10-08 LAB — LIPID PANEL
Chol/HDL Ratio: 4.6 ratio (ref 0.0–5.0)
Cholesterol, Total: 174 mg/dL (ref 100–199)
HDL: 38 mg/dL — ABNORMAL LOW (ref >39)
LDL Chol Calc (NIH): 93 mg/dL (ref 0–99)
Triglycerides: 258 mg/dL — ABNORMAL HIGH (ref 0–149)
VLDL Cholesterol Cal: 43 mg/dL — ABNORMAL HIGH (ref 5–40)

## 2022-10-10 ENCOUNTER — Ambulatory Visit (INDEPENDENT_AMBULATORY_CARE_PROVIDER_SITE_OTHER): Payer: Medicare Other | Admitting: Student

## 2022-10-10 ENCOUNTER — Telehealth: Payer: Self-pay | Admitting: Student

## 2022-10-10 VITALS — BP 128/70 | HR 70 | Wt 211.2 lb

## 2022-10-10 DIAGNOSIS — L720 Epidermal cyst: Secondary | ICD-10-CM

## 2022-10-10 NOTE — Progress Notes (Signed)
    SUBJECTIVE:   CHIEF COMPLAINT / HPI:   Corey Watts is a 67 year-old male presenting to [facility] dermatology clinic for removal of cyst on the left side of his back.  He says it has been present "forever" (a few years at least).  It is not painful or itchy.  It is bothersome.  He says that he has had cysts like this removed in the past.  He says that they have drained foul-smelling material in the past. Denies any fevers or chills.  PERTINENT  PMH / PSH: Reviewed  OBJECTIVE:   BP 128/70   Pulse 70   Wt 211 lb 3.2 oz (95.8 kg)   SpO2 92%   BMI 28.64 kg/m   General: Well-appearing, no distress, pleasant Respiratory: Normal work of breathing on room air Skin: Left-sided thoracic area with painless firm, flesh-colored 2.5 cm x 2 to 3 cm round, mobile, fluctuant cyst without surrounding erythema    ASSESSMENT/PLAN:   Inclusion cyst Benign - Various treatment options discussed including monitoring. Patient opted for cyst removal. See procedure note below.  Excision of Benign Skin Lesion Procedure Note  PRE-OP DIAGNOSIS: Inclusion cyst  POST-OP DIAGNOSIS: Same   PROCEDURE: skin lesion excision  Performing Physician: Orvis Brill, DO  Supervising Physician (if applicable): Andrena Mews, MD  PROCEDURE:  Excision of cyst  The area surrounding the skin lesion was prepared and draped in the  usual sterile manner.  5 cc of Xylocaine local anesthetic administered around cyst.  Incision was made using 15 blade scalpel.  Cyst contents were expressed using lateral pressure on the sides of the cyst.  Blunt and sharp dissection used to free cyst wall from adjacent tissues.   4 interrupted sutures used for closure.  Hemostasis was assured.  Closure: 3-0 Vicryl, 4 stitches  Followup: The patient tolerated the procedure well without  complications.  Standard post-procedure care is explained and return  precautions are given.    Orvis Brill, [place]

## 2022-10-10 NOTE — Patient Instructions (Signed)
It was nice seeing you today. I have provided you with suture wound care instruction below. Please return in 2 weeks to the nurse clinic for suture removal. Please call or come in sooner if you experience any pain, redness, swelling or discharge from the suture area.   Sutured Wound Care Sutures are stitches that can be used to close wounds. Some stitches break down as they heal (absorbable). Other stitches need to be taken out by your doctor (nonabsorbable). Taking good care of your wound can help to prevent pain and infection. It can also help your wound heal more quickly. Follow instructions from your doctor about how to care for your sutured wound. Supplies needed: Soap and water. A clean, dry towel. Solution to clean your wound, if needed. A clean gauze or bandage (dressing), if needed. Antibiotic ointment, if told by your doctor. How to care for your sutured wound  Keep the wound fully dry for the first 24 hours or as long as told by your doctor. After 24-48 hours, you may shower or bathe as told by your doctor. Do not soak the wound or put the wound under water until the stitches have been taken out. After the first 24 hours, clean the wound once a day, or as often as your doctor tells you to. Take these steps: Wash and rinse the wound as told by your health care provider. Pat the wound dry with a clean towel. Do not rub the wound. After cleaning the wound, put a thin layer of antibiotic ointment on the wound as told by your doctor. This will help: Prevent infection. Keep the bandage from sticking to the wound. Follow instructions from your doctor about how to change your bandage. Make sure you: Wash your hands with soap and water for at least 20 seconds. If you cannot use soap and water, use hand sanitizer. Change your bandage at least once a day, or as often as told by your doctor. If your dressing gets wet or dirty, change it. Leavestitches in place for at least 2 weeks. If you have  skin glue over your stitches, this should also stay in place for at least 2 weeks. Leave tape strips alone (if you have them) unless you are told to take them off. You may trim the edges of the tape strips if they curl up. Check your wound every day for signs of infection. Watch for: Redness, swelling, or pain. Fluid or blood. New warmth, a rash, or hardness at the wound site. Pus or a bad smell. Have the stitches taken out as told by your doctor. Follow these instructions at home: Medicines Take or apply over-the-counter and prescription medicines only as told by your doctor. If you were prescribed an antibiotic medicine or ointment, take or apply it as told by your doctor. Do not stop using the antibiotic even if you start to feel better. General instructions Cover your wound with clothes or put sunscreen on when you are outside. Use a sunscreen of at least 30 SPF. Do not scratch or pick at your wound. Avoid stretching your wound. Raise the injured area above the level of your heart while you are sitting or lying down, if possible. Eat a diet that includes protein, vitamin A, and vitamin C. Doing this will help your wound heal. Drink enough fluid to keep your pee (urine) pale yellow. Keep all follow-up visits. Contact a doctor if: You were given a tetanus shot and you have any of the following at the  site where the needle went in: Swelling. Very bad pain. Redness. Bleeding. Your wound breaks open. You see something coming out of your wound, such as wood or glass. You have any of these signs of infection in or around your wound: Redness, swelling, or pain. Fluid or blood. Warmth. A new rash. Your wound feels hard. You have a fever. The skin near your wound changes color. You have pain that does not get better with medicine. You get numbness around the wound. Get help right away if: You have very bad swelling or more pain around your wound. You have pus or a bad smell coming  from your wound. You have painful lumps near your wound or anywhere on your body. You have a red streak going away from your wound. The wound is on your hand or foot, and: Your fingers or toes look pale or blue. You cannot move a finger or toe as you used to do. You have numbness that spreads down your hand, foot, fingers, or toes. Summary Sutures are stitches that are used to close wounds. Taking good care of your wound can help to prevent pain and infection. Keep the wound fully dry for the first 24 hours or for as long as told by your doctor. After 24-48 hours, you may shower or bathe as told by your doctor. This information is not intended to replace advice given to you by your health care provider. Make sure you discuss any questions you have with your health care provider. Document Revised: 01/08/2021 Document Reviewed: 01/08/2021 Elsevier Patient Education  [place].

## 2022-10-10 NOTE — Telephone Encounter (Signed)
Called and spoke with patients wife. Consented for teaching POCUS to residents next week. Explained will have a form for them to sign. Date and time given. All questions answered.   Yehuda Savannah MD

## 2022-10-10 NOTE — Telephone Encounter (Signed)
Wife came up to question that her husband is suppose to have a study done with Dr Thompson Grayer and Dr Joelyn Oms.  Not sure what she's talking about. Her husband is suppose to participate in it.  Can you call her and give details of this study you are doing.  [phone]

## 2022-10-13 DIAGNOSIS — L720 Epidermal cyst: Secondary | ICD-10-CM | POA: Insufficient documentation

## 2022-10-13 NOTE — Assessment & Plan Note (Signed)
Benign - Various treatment options discussed including monitoring. Patient opted for cyst removal. See procedure note below.  Excision of Benign Skin Lesion Procedure Note  PRE-OP DIAGNOSIS: Inclusion cyst  POST-OP DIAGNOSIS: Same   PROCEDURE: skin lesion excision  Performing Physician: Orvis Brill, DO  Supervising Physician (if applicable): Andrena Mews, MD  PROCEDURE:  Excision of cyst  The area surrounding the skin lesion was prepared and draped in the  usual sterile manner.  5 cc of Xylocaine local anesthetic administered around cyst.  Incision was made using 15 blade scalpel.  Cyst contents were expressed using lateral pressure on the sides of the cyst.  Blunt and sharp dissection used to free cyst wall from adjacent tissues.   4 interrupted sutures used for closure.  Hemostasis was assured.  Closure: 3-0 Vicryl, 4 stitches  Followup: The patient tolerated the procedure well without  complications.  Standard post-procedure care is explained and return  precautions are given.

## 2022-10-24 ENCOUNTER — Encounter (HOSPITAL_COMMUNITY): Payer: Self-pay | Admitting: *Deleted

## 2022-10-25 ENCOUNTER — Ambulatory Visit (INDEPENDENT_AMBULATORY_CARE_PROVIDER_SITE_OTHER): Payer: Medicare Other

## 2022-10-25 ENCOUNTER — Telehealth: Payer: Self-pay

## 2022-10-25 DIAGNOSIS — Z4802 Encounter for removal of sutures: Secondary | ICD-10-CM

## 2022-10-25 NOTE — Progress Notes (Signed)
Patient presents to nurse clinic for suture removal. Patient denies pain to the site or fever. No redness or swelling observed. Removed three sutures from left back. Patient tolerated removal well.   Spoke with Dr. Andria Frames as note indicated that there were four sutures placed. Dr. Andria Frames also evaluated patient and determined that there was no additional suture.   Dressed area and provided with return precautions.   Talbot Grumbling, RN

## 2022-10-25 NOTE — Telephone Encounter (Signed)
Patient presents to nurse clinic today for suture removal. However, he reports concerns of diarrhea.   Diarrhea- onset on Tuesday. Watery diarrhea one episode per day. He does report having two episodes this morning.   No abdominal pain, nausea or vomiting. No blood in stool. Denies fever and chills.  He is concerned that this may be related to side effects of jardiance. He is asking if he should try alternative medication.   Offered appointment with provider in clinic. Patient declined and asked that I send message to provider regarding concern.   Return precautions discussed.   Forwarding to PCP.   Talbot Grumbling, RN

## 2022-11-09 ENCOUNTER — Other Ambulatory Visit: Payer: Self-pay | Admitting: Student

## 2022-11-09 DIAGNOSIS — I4891 Unspecified atrial fibrillation: Secondary | ICD-10-CM

## 2022-11-18 ENCOUNTER — Other Ambulatory Visit: Payer: Self-pay | Admitting: Student

## 2022-11-18 DIAGNOSIS — I4891 Unspecified atrial fibrillation: Secondary | ICD-10-CM

## 2022-11-25 ENCOUNTER — Other Ambulatory Visit: Payer: Self-pay

## 2022-11-25 DIAGNOSIS — M7741 Metatarsalgia, right foot: Secondary | ICD-10-CM

## 2022-11-25 MED ORDER — PREGABALIN 25 MG PO CAPS: 25.0000 mg | ORAL_CAPSULE | Freq: Two times a day (BID) | ORAL | 1 refills | Status: DC

## 2022-12-03 DIAGNOSIS — H6122 Impacted cerumen, left ear: Secondary | ICD-10-CM | POA: Insufficient documentation

## 2022-12-05 ENCOUNTER — Other Ambulatory Visit: Payer: Self-pay

## 2022-12-05 DIAGNOSIS — M545 Low back pain, unspecified: Secondary | ICD-10-CM

## 2022-12-05 MED ORDER — HYDROCODONE-ACETAMINOPHEN 5-325 MG PO TABS: 1.0000 | ORAL_TABLET | Freq: Two times a day (BID) | ORAL | 0 refills | Status: DC | PRN

## 2022-12-05 NOTE — Progress Notes (Signed)
Cardiology Office Note:    Date:  12/06/2022   ID:  Corey Ranch Sr., DOB 1956-08-08, MRN [id number]  PCP:  Eppie Gibson, MD   [facility] Prov[facility] Cardiologist:  Werner Lean, MD     Referring MD: Eppie Gibson, MD   CC: AF and HF f/u  History of Present Illness:    Corey Labrosse. is a 67 y.o. male with a hx of Moderate non obstructive CAD in 0865 complicated by coronary vasospasm.  Alcohol use, atrial fibrillation, and morbid obesity. New Heart Failure with reduced EF.  2023 Successful DCCV, Lost 17 lbs after stopping alcohol; still having SOB at AF clinic eval.  Patient notes that he is doing well.   Rare shortness of breath. No chest pain or pressure . No weight gain or leg swelling.  No palpitations or syncope .  Feels great.  He saw old photos of himself and looks like a new man.  Wife notes weight loss as well.   Past Medical History:  Diagnosis Date   Acute hearing loss of left ear 03/12/2021   Allergy    Allergic rhinitis   Angina pectoris ([place]) 09/16/2004   Known coronary vasospasm -- treated with combination of long-acting nitrates and calcium channel blockers; NTG when necessary   CAD (coronary artery disease) 04/16/2005   Mild obstructive ramus intermedius disease (30-50%) and 50-60% proximal   Chronic back pain    Stemming from prior lumbar diskectomy   GERD (gastroesophageal reflux disease)    Hyperlipidemia    Hypertension    Obesity    Substance abuse ([place])    Known chronic alcoholic - not interested in quitting (current as of 2014)    Past Surgical History:  Procedure Laterality Date   [place]   Chronic back pain stemming from this   [place]  2005   Dr. Daneen Schick -- Cardiolite stress test showed no evidence of ischemia   CARDIOVERSION N/A 08/30/2022   Procedure: CARDIOVERSION;  Surgeon: Lelon Perla, MD;  Location: [facility] ENDOSCOPY;  Service: Cardiovascular;  Laterality:  N/A;   COLONOSCOPY W/ BIOPSIES  2011   Multiple small polyps and s/p polypectomy -- recommended repeat in 2 years due to multiple small polyps   ESOPHAGEAL DILATION  1997    Current Medications: Current Meds  Medication Sig   acetaminophen (TYLENOL) 500 MG tablet Take 500 mg by mouth daily.   allopurinol (ZYLOPRIM) 100 MG tablet TAKE 1 TABLET(100 MG) BY MOUTH DAILY   amLODipine (NORVASC) 10 MG tablet TAKE 1 TABLET(10 MG) BY MOUTH DAILY   busPIRone (BUSPAR) 30 MG tablet TAKE 1 TABLET(30 MG) BY MOUTH TWICE DAILY   ELIQUIS 5 MG TABS tablet TAKE 1 TABLET(5 MG) BY MOUTH TWICE DAILY   empagliflozin (JARDIANCE) 10 MG TABS tablet Take 1 tablet (10 mg total) by mouth daily.   HYDROcodone-acetaminophen (NORCO/VICODIN) 5-325 MG tablet Take 1 tablet by mouth 2 (two) times daily as needed.   isosorbide mononitrate (IMDUR) 60 MG 24 hr tablet TAKE 1 TABLET(60 MG) BY MOUTH DAILY   losartan (COZAAR) 25 MG tablet Take 1 tablet (25 mg total) by mouth daily.   metoprolol succinate (TOPROL-XL) 50 MG 24 hr tablet Take 1 tablet (50 mg total) by mouth daily at 6 (six) AM.   Multiple Vitamin (MULTIVITAMIN WITH MINERALS) TABS tablet Take 1 tablet by mouth daily.   naloxone (NARCAN) nasal spray 4 mg/0.1 mL To be used only in case  of suspected opioid overdose   nitroGLYCERIN (NITROSTAT) 0.4 MG SL tablet PLACE 1 TABLET UNDER THE TONGUE IF NEEDED FOR CHEST PAIN EVERY 5 MINUTES FOR 3 DOSES. IF NO RELIEF AFTER 3RD DOSE CALL MD OR 911   omeprazole (PRILOSEC) 20 MG capsule TAKE 1 CAPSULE(20 MG) BY MOUTH DAILY   OVER THE COUNTER MEDICATION Apply 1 Application topically at bedtime as needed (pain). CBD lotion   pravastatin (PRAVACHOL) 40 MG tablet TAKE 1 TABLET(40 MG) BY MOUTH DAILY   pregabalin (LYRICA) 25 MG capsule Take 1 capsule (25 mg total) by mouth 2 (two) times daily.     Allergies:   Diphenhydramine hcl and Zantac [ranitidine hcl]   Social History   Socioeconomic History   Marital status: Married    Spouse  name: Corey Watts   Number of children: 2   Years of education: 8   Highest education level: 8th grade  Occupational History   Occupation: Disabled   Tobacco Use   Smoking status: Former    Packs/day: 2    Types: Cigarettes    Passive exposure: Past   Smokeless tobacco: Former    Quit date: 1991   Tobacco comments:    2PPD smoker until 1991  Vaping Use   Vaping Use: Never used  Substance and Sexual Activity   Alcohol use: Yes    Alcohol/week: 3.0 standard drinks of alcohol    Types: 3 Cans of beer per week   Drug use: Not Currently   Sexual activity: Yes  Other Topics Concern   Not on file  Social History Narrative   Patient lives with his wife Corey Watts.    Patient has 2 adult children, with 6 grandchildren, 1 great grandchild.    Patients family lives all on the same street.    Patient enjoys being outside, fixing up his home, and spending time with his family.    Social Determinants of Health   Financial Resource Strain: Low Risk  (11/16/2020)   Overall Financial Resource Strain (CARDIA)    Difficulty of Paying Living Expenses: Not hard at all  Food Insecurity: No Food Insecurity (11/16/2020)   Hunger Vital Sign    Worried About Running Out of Food in the Last Year: Never true    Ran Out of Food in the Last Year: Never true  Transportation Needs: No Transportation Needs (11/16/2020)   PRAPARE - Hydrologist (Medical): No    Lack of Transportation (Non-Medical): No  Physical Activity: Inactive (11/16/2020)   Exercise Vital Sign    Days of Exercise per Week: 0 days    Minutes of Exercise per Session: 0 min  Stress: No Stress Concern Present (11/16/2020)   [place]    Feeling of Stress : Only a little  Social Connections: Moderately Isolated (11/16/2020)   Social Connection and Isolation Panel [NHANES]    Frequency of Communication with Friends and Family: More than three times a week     Frequency of Social Gatherings with Friends and Family: More than three times a week    Attends Religious Services: Never    Marine scientist or Organizations: No    Attends Music therapist: Never    Marital Status: Married    Social: Married on his 18th birthday to wife  Family History: The patient's family history includes Heart disease in his father; Heart failure in his father and mother.  ROS:   Please see the  history of present illness.     All other systems reviewed and are negative.  EKGs/Labs/Other Studies Reviewed:    The following studies were reviewed today:  EKG:  EKG is  ordered today.  The ekg ordered today demonstrates  12/06/22: sinus bradycardia with anterior T wave 08/16/22: Afib RVR  Cardiac Studies & Procedures       ECHOCARDIOGRAM  ECHOCARDIOGRAM COMPLETE 08/12/2022  Narrative ECHOCARDIOGRAM REPORT    Patient Name:   Corey PFAFF Sr. Date of Exam: 08/12/2022 Medical Rec #:  [id number]           Height:       72.0 in Accession #:    [id number]          Weight:       219.6 lb Date of Birth:  [birth year]           BSA:          2.217 m Patient Age:    32 years            BP:           138/80 mmHg Patient Gender: M                   HR:           139 bpm. Exam Location:  [facility]  Procedure: 2D Echo, Cardiac Doppler and Color Doppler  Indications:    Atrial Fibrillation with rapid ventricular response I48.91  History:        Patient has no prior history of Echocardiogram examinations. CAD; Risk Factors:Hypertension and Dyslipidemia.  Sonographer:    Mikki Santee RDCS Referring Phys: Eppie Gibson  IMPRESSIONS   1. Left ventricular ejection fraction, by estimation, is 30 to 35%. The left ventricle has moderately decreased function. The left ventricle demonstrates global hypokinesis. Left ventricular diastolic parameters are indeterminate. 2. Right ventricular systolic function is mildly reduced. The right  ventricular size is normal. There is mildly elevated pulmonary artery systolic pressure. The estimated right ventricular systolic pressure is 0000000 mmHg. 3. Left atrial size was mildly dilated. 4. Right atrial size was moderately dilated. 5. The mitral valve is normal in structure. Mild mitral valve regurgitation. No evidence of mitral stenosis. 6. The aortic valve is tricuspid. Aortic valve regurgitation is not visualized. No aortic stenosis is present. 7. Aortic dilatation noted. There is mild dilatation of the ascending aorta, measuring 41 mm. 8. The inferior vena cava is dilated in size with <50% respiratory variability, suggesting right atrial pressure of 15 mmHg. 9. The patient is in rapid atrial fibrillation. Needs attention towards rate control and likely cardioversion. Need to reassess LV systolic function when in NSR.  FINDINGS Left Ventricle: Left ventricular ejection fraction, by estimation, is 30 to 35%. The left ventricle has moderately decreased function. The left ventricle demonstrates global hypokinesis. The left ventricular internal cavity size was normal in size. There is no left ventricular hypertrophy. Left ventricular diastolic parameters are indeterminate.  Right Ventricle: The right ventricular size is normal. No increase in right ventricular wall thickness. Right ventricular systolic function is mildly reduced. There is mildly elevated pulmonary artery systolic pressure. The tricuspid regurgitant velocity is 2.30 m/s, and with an assumed right atrial pressure of 15 mmHg, the estimated right ventricular systolic pressure is 0000000 mmHg.  Left Atrium: Left atrial size was mildly dilated.  Right Atrium: Right atrial size was moderately dilated.  Pericardium: There is no evidence of pericardial effusion.  Mitral Valve: The  mitral valve is normal in structure. Mild mitral valve regurgitation. No evidence of mitral valve stenosis.  Tricuspid Valve: The tricuspid valve is  normal in structure. Tricuspid valve regurgitation is mild.  Aortic Valve: The aortic valve is tricuspid. Aortic valve regurgitation is not visualized. No aortic stenosis is present.  Pulmonic Valve: The pulmonic valve was normal in structure. Pulmonic valve regurgitation is trivial.  Aorta: Aortic dilatation noted. There is mild dilatation of the ascending aorta, measuring 41 mm.  Venous: The inferior vena cava is dilated in size with less than 50% respiratory variability, suggesting right atrial pressure of 15 mmHg.  IAS/Shunts: No atrial level shunt detected by color flow Doppler.   LEFT VENTRICLE PLAX 2D LVIDd:         5.40 cm LVIDs:         3.70 cm LV PW:         0.90 cm LV IVS:        0.80 cm LVOT diam:     2.10 cm LV SV:         39 LV SV Index:   18 LVOT Area:     3.46 cm   RIGHT VENTRICLE RV Basal diam:  3.90 cm RV Mid diam:    3.30 cm  LEFT ATRIUM             Index        RIGHT ATRIUM           Index LA diam:        3.40 cm 1.53 cm/m   RA Area:     25.20 cm LA Vol (A2C):   71.2 ml 32.12 ml/m  RA Volume:   80.30 ml  36.22 ml/m LA Vol (A4C):   69.6 ml 31.39 ml/m LA Biplane Vol: 73.4 ml 33.11 ml/m AORTIC VALVE LVOT Vmax:   81.20 cm/s LVOT Vmean:  53.267 cm/s LVOT VTI:    0.113 m  AORTA Ao Root diam: 3.80 cm Ao Asc diam:  4.10 cm  TRICUSPID VALVE TR Peak grad:   21.2 mmHg TR Vmax:        230.00 cm/s  SHUNTS Systemic VTI:  0.11 m Systemic Diam: 2.10 cm  Dalton McleanMD Electronically signed by Franki Monte Signature Date/Time: 08/12/2022/5:06:14 PM    Final              Recent Labs: 07/19/2022: TSH 3.120 08/16/2022: BUN 15; Creatinine, Ser 0.99; Hemoglobin 14.9; Platelets 250; Potassium 4.2; Sodium 141  Recent Lipid Panel    Component Value Date/Time   CHOL 174 10/07/2022 1133   TRIG 258 (H) 10/07/2022 1133   HDL 38 (L) 10/07/2022 1133   CHOLHDL 4.6 10/07/2022 1133   CHOLHDL 3.9 02/14/2015 0955   VLDL 76 (H) 02/14/2015 0955    LDLCALC 93 10/07/2022 1133     Risk Assessment/Calculations:    CHA2DS2-VASc Score = 4   This indicates a 4.8% annual risk of stroke. The patient's score is based upon: CHF History: 1 HTN History: 1 Diabetes History: 0 Stroke History: 0 Vascular Disease History: 1 Age Score: 1 Gender Score: 0          Physical Exam:    VS:  BP 128/80 (BP Location: Left Arm, Patient Position: Sitting, Cuff Size: Normal)   Pulse (!) 58   Ht 6' (1.829 m)   Wt 213 lb 6.4 oz (96.8 kg)   SpO2 90%   BMI 28.94 kg/m     Wt Readings from Last 3 Encounters:  12/06/22 213 lb 6.4 oz (96.8 kg)  10/10/22 211 lb 3.2 oz (95.8 kg)  10/07/22 211 lb 12.8 oz (96.1 kg)    GEN: No distress HEENT: Normal NECK: No JVD CARDIAC: regular bradycardia no murmurs, rubs, gallops RESPIRATORY:  Clear to auscultation without rales, wheezing or rhonchi  ABDOMEN: Soft, non-tender, non-distended MUSCULOSKELETAL:  trace bilateral edema; No deformity  SKIN: Warm and dry NEUROLOGIC:  Alert and oriented x 3 PSYCHIATRIC:  Normal affect   ASSESSMENT:    1. HFrEF (heart failure with reduced ejection fraction) (HCC)   2. Persistent atrial fibrillation ([place])   3. Coronary artery disease involving native coronary artery of native heart without angina pectoris     PLAN:    Heart Failure Reduced Ejection Fraction  Moderate non obstructive CAD - NYHA class II, Stage C, euvolemic, etiology from AF or alcohol suspected - Diuretic regimen: No  - Discussed the importance of fluid restriction of < 2 L, salt restriction, and checking daily weights  - continue succinate 50 mg cannot further increase - deferring MRA and SGLT2i start now - no PRN nitro needed; continue IMDUR - not amenable to increase statin trial, continue pravastatin  I would like to consolidate his norvasc (Raynauds) and losartan to Entresto given his rare symptoms; he does not want medication changes - we will repeat his echo; if EF Is still depressed he  is amenable to these changes   Paroxsymal Atrial Fibrillation,  - CHADSVASC=4. - Continue anticoagulation with eliquis; Acquired Thrombophilia  Six moth f/u with the team       Medication Adjustments/Labs and Tests Ordered: Current medicines are reviewed at length with the patient today.  Concerns regarding medicines are outlined above.  Orders Placed This Encounter  Procedures   EKG 12-Lead   ECHOCARDIOGRAM LIMITED   No orders of the defined types were placed in this encounter.   Patient Instructions  Medication Instructions:  Your physician recommends that you continue on your current medications as directed. Please refer to the Current Medication list given to you today.  *If you need a refill on your cardiac medications before your next appointment, please call your pharmacy*   Lab Work: NONE If you have labs (blood work) drawn today and your tests are completely normal, you will receive your results only by: [facility] (if you have MyChart) OR A paper copy in the mail If you have any lab test that is abnormal or we need to change your treatment, we will call you to review the results.   Testing/Procedures: Your physician has requested that you have an echocardiogram. Echocardiography is a painless test that uses sound waves to create images of your heart. It provides your doctor with information about the size and shape of your heart and how well your heart's chambers and valves are working. This procedure takes approximately one hour. There are no restrictions for this procedure. Please do NOT wear cologne, perfume, aftershave, or lotions (deodorant is allowed). Please arrive 15 minutes prior to your appointment time.    Follow-Up: At [facility], you and your health needs are our priority.  As part of our continuing mission to provide you with exceptional heart care, we have created designated Provider Care Teams.  These Care Teams include your  primary Cardiologist (physician) and Advanced Practice Providers (APPs -  Physician Assistants and Nurse Practitioners) who all work together to provide you with the care you need, when you need it.  We recommend signing up for the  patient portal called "MyChart".  Sign up information is provided on this After Visit Summary.  MyChart is used to connect with patients for Virtual Visits (Telemedicine).  Patients are able to view lab/test results, encounter notes, upcoming appointments, etc.  Non-urgent messages can be sent to your provider as well.   To learn more about what you can do with MyChart, go to [email or web address].    Your next appointment:   6 month(s)  Provider:   Werner Lean, MD  or Nicholes Rough, PA-C, Ermalinda Barrios, PA-C, or Christen Bame, NP          Signed, Werner Lean, MD  12/06/2022 9:18 AM    [place]

## 2022-12-06 ENCOUNTER — Ambulatory Visit: Payer: Medicare Other | Attending: Internal Medicine | Admitting: Internal Medicine

## 2022-12-06 VITALS — BP 128/80 | HR 58 | Ht 72.0 in | Wt 213.4 lb

## 2022-12-06 DIAGNOSIS — I5020 Unspecified systolic (congestive) heart failure: Secondary | ICD-10-CM

## 2022-12-06 DIAGNOSIS — I2510 Atherosclerotic heart disease of native coronary artery without angina pectoris: Secondary | ICD-10-CM

## 2022-12-06 DIAGNOSIS — I4819 Other persistent atrial fibrillation: Secondary | ICD-10-CM

## 2022-12-06 NOTE — Patient Instructions (Signed)
Medication Instructions:  Your physician recommends that you continue on your current medications as directed. Please refer to the Current Medication list given to you today.  *If you need a refill on your cardiac medications before your next appointment, please call your pharmacy*   Lab Work: NONE If you have labs (blood work) drawn today and your tests are completely normal, you will receive your results only by: [place] (if you have MyChart) OR A paper copy in the mail If you have any lab test that is abnormal or we need to change your treatment, we will call you to review the results.   Testing/Procedures: Your physician has requested that you have an echocardiogram. Echocardiography is a painless test that uses sound waves to create images of your heart. It provides your doctor with information about the size and shape of your heart and how well your heart's chambers and valves are working. This procedure takes approximately one hour. There are no restrictions for this procedure. Please do NOT wear cologne, perfume, aftershave, or lotions (deodorant is allowed). Please arrive 15 minutes prior to your appointment time.    Follow-Up: At [facility], you and your health needs are our priority.  As part of our continuing mission to provide you with exceptional heart care, we have created designated Provider Care Teams.  These Care Teams include your primary Cardiologist (physician) and Advanced Practice Providers (APPs -  Physician Assistants and Nurse Practitioners) who all work together to provide you with the care you need, when you need it.  We recommend signing up for the patient portal called "MyChart".  Sign up information is provided on this After Visit Summary.  MyChart is used to connect with patients for Virtual Visits (Telemedicine).  Patients are able to view lab/test results, encounter notes, upcoming appointments, etc.  Non-urgent messages can be sent to your  provider as well.   To learn more about what you can do with MyChart, go to [email or web address].    Your next appointment:   6 month(s)  Provider:   Werner Lean, MD  or Nicholes Rough, PA-C, Ermalinda Barrios, PA-C, or Christen Bame, NP

## 2022-12-07 ENCOUNTER — Other Ambulatory Visit: Payer: Self-pay | Admitting: Student

## 2022-12-10 ENCOUNTER — Ambulatory Visit (INDEPENDENT_AMBULATORY_CARE_PROVIDER_SITE_OTHER): Payer: Medicare Other | Admitting: Student

## 2022-12-10 ENCOUNTER — Encounter: Payer: Self-pay | Admitting: Student

## 2022-12-10 VITALS — BP 136/80 | HR 76 | Wt 213.0 lb

## 2022-12-10 DIAGNOSIS — M5442 Lumbago with sciatica, left side: Secondary | ICD-10-CM

## 2022-12-10 NOTE — Progress Notes (Signed)
    SUBJECTIVE:   CHIEF COMPLAINT / HPI:   Acute on Chronic Low Back Pain Known longstanding history of lumbar back pain, history of multiple surgeries in the early nineties. Symptoms generally well-controlled with Norco. However, acute change/worsening of symptoms over the past few weeks. Pain is so severe he "wants to kill himself." Has to stop whatever he's doing and lie perfectly still until the pain passes. This is totally new and different from his chronic pain. Also with intermittent chills over the same time period.  Has tried lidocaine, topical capsaicin, and topical CBD. Nothing is helping. His home norco is likewise not effective in treating this new pain.  No fevers, incontinence, or saddle anesthesia. No weakness or sensory changes.    OBJECTIVE:   BP 136/80   Pulse 76   Wt 213 lb (96.6 kg)   SpO2 98%   BMI 28.89 kg/m   Gen: Uncomfortable appearing, in good spirits Pulm: Normal WOB on RA MSK: Lumbar spine with well-healed old surgical scar, area of central swelling ~3cm in diameter that is tender to palpation, no obvious step-off or deformity. ++midline tenderness in this area. + Straight leg test on the Left. Strength is 5/5 in BLE.   ASSESSMENT/PLAN:   Acute midline low back pain with left-sided sciatica Acute on chronic. Concerning finding in this case given his age, surgical history, focal swelling on exam and chills. Previously very well-controlled. He is a stoic patient and usually not one to come in complaining of pain; I am concerned that there is an acute process at play here. Infectious process vs fracture.  - Lumbar X ray ordered - CBC, ESR, CRP - Will pursue MRI Lumbar spine urgently  - ER precautions reviewed for progression of symptoms      J Pearla Dubonnet, MD [place]

## 2022-12-10 NOTE — Patient Instructions (Addendum)
I'm worried about your back. I want you to get an X ray today or tomorrow at [facility] on the first floor of the [facility] ([place]).   We're getting some labs today and I'm ordering an MRI. We will work on our end to get this approved with your insurance ASAP.  Marnee Guarneri, MD

## 2022-12-10 NOTE — Assessment & Plan Note (Addendum)
Acute on chronic. Concerning finding in this case given his age, surgical history, focal swelling on exam and chills. Previously very well-controlled. He is a stoic patient and usually not one to come in complaining of pain; I am concerned that there is an acute process at play here. Infectious process vs fracture.  - Lumbar X ray ordered - CBC, ESR, CRP - Will pursue MRI Lumbar spine urgently  - ER precautions reviewed for progression of symptoms

## 2022-12-11 ENCOUNTER — Ambulatory Visit
Admission: RE | Admit: 2022-12-11 | Discharge: 2022-12-11 | Disposition: A | Discharge status: Home / Self Care | Payer: Medicare Other | Source: Ambulatory Visit | Attending: Family Medicine | Admitting: Family Medicine

## 2022-12-11 DIAGNOSIS — M5442 Lumbago with sciatica, left side: Secondary | ICD-10-CM

## 2022-12-11 LAB — CBC WITH DIFFERENTIAL/PLATELET
Basophils Absolute: 0 10*3/uL (ref 0.0–0.2)
Basos: 1 %
EOS (ABSOLUTE): 0.1 10*3/uL (ref 0.0–0.4)
Eos: 2 %
Hematocrit: 47.3 % (ref 37.5–51.0)
Hemoglobin: 16.5 g/dL (ref 13.0–17.7)
Immature Grans (Abs): 0 10*3/uL (ref 0.0–0.1)
Immature Granulocytes: 0 %
Lymphocytes Absolute: 1.9 10*3/uL (ref 0.7–3.1)
Lymphs: 27 %
MCH: 33.1 pg — ABNORMAL HIGH (ref 26.6–33.0)
MCHC: 34.9 g/dL (ref 31.5–35.7)
MCV: 95 fL (ref 79–97)
Monocytes Absolute: 0.7 10*3/uL (ref 0.1–0.9)
Monocytes: 10 %
Neutrophils Absolute: 4.2 10*3/uL (ref 1.4–7.0)
Neutrophils: 60 %
Platelets: 220 10*3/uL (ref 150–450)
RBC: 4.99 x10E6/uL (ref 4.14–5.80)
RDW: 14.4 % (ref 11.6–15.4)
WBC: 7 10*3/uL (ref 3.4–10.8)

## 2022-12-11 LAB — C-REACTIVE PROTEIN: CRP: 3 mg/L (ref 0–10)

## 2022-12-11 LAB — SEDIMENTATION RATE: Sed Rate: 3 mm/hr (ref 0–30)

## 2022-12-12 ENCOUNTER — Telehealth: Payer: Self-pay | Admitting: Student

## 2022-12-12 DIAGNOSIS — M5442 Lumbago with sciatica, left side: Secondary | ICD-10-CM

## 2022-12-12 NOTE — Telephone Encounter (Signed)
Called patient to discuss labs and MRI. He tells me he is unable to attend the scheduled MRI at 5p on 4/3 due to transportation issues. He can only make morning appointments. Also concerned about facility fees at the hospital and wondering if this could be done at [place]. I will coordinate with our clinic staff to see if we can assist in getting this rescheduled at his preferred location.

## 2022-12-16 NOTE — Addendum Note (Signed)
Addended by: Jim Like B on: 12/16/2022 07:50 AM   Modules accepted: Orders

## 2022-12-18 ENCOUNTER — Ambulatory Visit (HOSPITAL_COMMUNITY): Payer: Medicare Other

## 2023-01-03 ENCOUNTER — Ambulatory Visit (HOSPITAL_COMMUNITY): Payer: Medicare Other | Attending: Internal Medicine

## 2023-01-03 DIAGNOSIS — I5020 Unspecified systolic (congestive) heart failure: Secondary | ICD-10-CM | POA: Insufficient documentation

## 2023-01-03 LAB — ECHOCARDIOGRAM LIMITED
Area-P 1/2: 4.44 cm2
S' Lateral: 3.1 cm

## 2023-01-05 ENCOUNTER — Other Ambulatory Visit: Payer: Medicare Other

## 2023-01-06 ENCOUNTER — Other Ambulatory Visit: Payer: Self-pay | Admitting: Student

## 2023-01-06 DIAGNOSIS — I4891 Unspecified atrial fibrillation: Secondary | ICD-10-CM

## 2023-01-21 ENCOUNTER — Other Ambulatory Visit: Payer: Self-pay | Admitting: Student

## 2023-01-21 DIAGNOSIS — M7741 Metatarsalgia, right foot: Secondary | ICD-10-CM

## 2023-01-22 ENCOUNTER — Emergency Department (HOSPITAL_COMMUNITY)
Admission: EM | Admit: 2023-01-22 | Discharge: 2023-01-22 | Disposition: A | Discharge status: Home / Self Care | Payer: Medicare Other | Attending: Emergency Medicine | Admitting: Emergency Medicine

## 2023-01-22 ENCOUNTER — Other Ambulatory Visit: Payer: Self-pay

## 2023-01-22 ENCOUNTER — Encounter (HOSPITAL_COMMUNITY): Payer: Self-pay | Admitting: Emergency Medicine

## 2023-01-22 ENCOUNTER — Ambulatory Visit (HOSPITAL_BASED_OUTPATIENT_CLINIC_OR_DEPARTMENT_OTHER)
Admission: RE | Admit: 2023-01-22 | Discharge: 2023-01-22 | Disposition: A | Discharge status: Home / Self Care | Payer: Medicare Other | Source: Ambulatory Visit | Attending: Internal Medicine | Admitting: Internal Medicine

## 2023-01-22 VITALS — BP 116/96 | HR 171 | Ht 72.0 in | Wt 209.8 lb

## 2023-01-22 DIAGNOSIS — I4819 Other persistent atrial fibrillation: Secondary | ICD-10-CM

## 2023-01-22 DIAGNOSIS — I4891 Unspecified atrial fibrillation: Secondary | ICD-10-CM | POA: Insufficient documentation

## 2023-01-22 DIAGNOSIS — I10 Essential (primary) hypertension: Secondary | ICD-10-CM | POA: Insufficient documentation

## 2023-01-22 DIAGNOSIS — Z7901 Long term (current) use of anticoagulants: Secondary | ICD-10-CM | POA: Insufficient documentation

## 2023-01-22 DIAGNOSIS — I2510 Atherosclerotic heart disease of native coronary artery without angina pectoris: Secondary | ICD-10-CM | POA: Insufficient documentation

## 2023-01-22 DIAGNOSIS — R531 Weakness: Secondary | ICD-10-CM | POA: Diagnosis present

## 2023-01-22 DIAGNOSIS — D6869 Other thrombophilia: Secondary | ICD-10-CM | POA: Insufficient documentation

## 2023-01-22 DIAGNOSIS — Z79899 Other long term (current) drug therapy: Secondary | ICD-10-CM | POA: Insufficient documentation

## 2023-01-22 DIAGNOSIS — I4892 Unspecified atrial flutter: Secondary | ICD-10-CM | POA: Insufficient documentation

## 2023-01-22 LAB — MAGNESIUM: Magnesium: 1.9 mg/dL (ref 1.7–2.4)

## 2023-01-22 LAB — COMPREHENSIVE METABOLIC PANEL
ALT: 16 U/L (ref 0–44)
AST: 36 U/L (ref 15–41)
Albumin: 4.4 g/dL (ref 3.5–5.0)
Alkaline Phosphatase: 55 U/L (ref 38–126)
Anion gap: 13 (ref 5–15)
BUN: 18 mg/dL (ref 8–23)
CO2: 22 mmol/L (ref 22–32)
Calcium: 10 mg/dL (ref 8.9–10.3)
Chloride: 105 mmol/L (ref 98–111)
Creatinine, Ser: 1.09 mg/dL (ref 0.61–1.24)
GFR, Estimated: >60 mL/min (ref >60)
Glucose, Bld: 118 mg/dL — ABNORMAL HIGH (ref 70–99)
Potassium: 4.5 mmol/L (ref 3.5–5.1)
Sodium: 140 mmol/L (ref 135–145)
Total Bilirubin: 0.9 mg/dL (ref 0.3–1.2)
Total Protein: 7.3 g/dL (ref 6.5–8.1)

## 2023-01-22 LAB — CBC WITH DIFFERENTIAL/PLATELET
Abs Immature Granulocytes: 0.01 10*3/uL (ref 0.00–0.07)
Basophils Absolute: 0 10*3/uL (ref 0.0–0.1)
Basophils Relative: 0 %
Eosinophils Absolute: 0.1 10*3/uL (ref 0.0–0.5)
Eosinophils Relative: 1 %
HCT: 47.9 % (ref 39.0–52.0)
Hemoglobin: 16.3 g/dL (ref 13.0–17.0)
Immature Granulocytes: 0 %
Lymphocytes Relative: 35 %
Lymphs Abs: 2.4 10*3/uL (ref 0.7–4.0)
MCH: 33 pg (ref 26.0–34.0)
MCHC: 34 g/dL (ref 30.0–36.0)
MCV: 97 fL (ref 80.0–100.0)
Monocytes Absolute: 0.6 10*3/uL (ref 0.1–1.0)
Monocytes Relative: 9 %
Neutro Abs: 3.7 10*3/uL (ref 1.7–7.7)
Neutrophils Relative %: 55 %
Platelets: 198 10*3/uL (ref 150–400)
RBC: 4.94 MIL/uL (ref 4.22–5.81)
RDW: 13.4 % (ref 11.5–15.5)
WBC: 6.8 10*3/uL (ref 4.0–10.5)
nRBC: 0 % (ref 0.0–0.2)

## 2023-01-22 LAB — TSH: TSH: 5.278 u[IU]/mL — ABNORMAL HIGH (ref 0.350–4.500)

## 2023-01-22 MED ORDER — METOPROLOL TARTRATE 5 MG/5ML IV SOLN
2.5000 mg | Freq: Once | INTRAVENOUS | Status: AC
  Administered 2023-01-22: 2.5 mg via INTRAVENOUS
  Filled 2023-01-22: qty 5

## 2023-01-22 MED ORDER — ETOMIDATE 2 MG/ML IV SOLN
INTRAVENOUS | Status: AC | PRN
  Administered 2023-01-22: 8 mg via INTRAVENOUS

## 2023-01-22 MED ORDER — SODIUM CHLORIDE 0.9 % IV BOLUS
500.0000 mL | Freq: Once | INTRAVENOUS | Status: AC
  Administered 2023-01-22: 500 mL via INTRAVENOUS

## 2023-01-22 MED ORDER — ETOMIDATE 2 MG/ML IV SOLN
8.0000 mg | Freq: Once | INTRAVENOUS | Status: DC
  Filled 2023-01-22: qty 10

## 2023-01-22 MED ORDER — METOPROLOL TARTRATE 5 MG/5ML IV SOLN
5.0000 mg | Freq: Once | INTRAVENOUS | Status: AC
  Administered 2023-01-22: 5 mg via INTRAVENOUS
  Filled 2023-01-22: qty 5

## 2023-01-22 NOTE — ED Provider Notes (Signed)
Emergency Department Provider Note   I have reviewed the triage vital signs and the nursing notes.   HISTORY  Chief Complaint Atrial Fibrillation   HPI Corey Watts. is a 67 y.o. male with a past history reviewed below including CAD, hypertension, hyperlipidemia, and PAF s/p cardioversion in December 2023, currently anticoagulated presents to the emergency department for evaluation of weakness for the past 3 days.  He describes severe fatigue but regularly takes his pulse and states they have been in normal range until today when they suddenly shot up into the 160s to 180s. He did feel some pre-syncope symptoms. No CP/pressure/tightness. Has remained complaint with his home medications including metoprolol and Eliquis. Last PO intake was noon today. He initially presented to the A-fib clinic and was referred here for further evaluation.   Past Medical History:  Diagnosis Date   Acute hearing loss of left ear 03/12/2021   Allergy    Allergic rhinitis   Angina pectoris (HCC) 09/16/2004   Known coronary vasospasm -- treated with combination of Mardene Lessig-acting nitrates and calcium channel blockers; NTG when necessary   CAD (coronary artery disease) 04/16/2005   Mild obstructive ramus intermedius disease (30-50%) and 50-60% proximal   Chronic back pain    Stemming from prior lumbar diskectomy   GERD (gastroesophageal reflux disease)    Hyperlipidemia    Hypertension    Obesity    Substance abuse (HCC)    Known chronic alcoholic - not interested in quitting (current as of 2014)    Review of Systems  Constitutional: No fever/chills. Positive generalized weakness.  Eyes: No visual changes. ENT: No sore throat. Cardiovascular: Denies chest pain. Positive high HR at home.  Respiratory: Denies shortness of breath. Gastrointestinal: No abdominal pain.  No nausea, no vomiting.  Genitourinary: Negative for dysuria. Musculoskeletal: Negative for back pain. Skin: Negative for  rash. Neurological: Negative for headaches, focal weakness or numbness.   ____________________________________________   PHYSICAL EXAM:  VITAL SIGNS: ED Triage Vitals  Enc Vitals Group     BP 01/22/23 1609 106/84     Pulse Rate 01/22/23 1609 (!) 170     Resp 01/22/23 1609 (!) 24     Temp 01/22/23 1609 98.9 F (37.2 C)     Temp Source 01/22/23 1609 Oral     SpO2 01/22/23 1609 95 %     Weight 01/22/23 1610 209 lb (94.8 kg)     Height 01/22/23 1610 6' (1.829 m)   Constitutional: Alert and oriented. Well appearing and in no acute distress. Eyes: Conjunctivae are normal. Head: Atraumatic. Nose: No congestion/rhinnorhea. Mouth/Throat: Mucous membranes are moist.   Neck: No stridor.   Cardiovascular: Tachycardia. Good peripheral circulation. Grossly normal heart sounds.   Respiratory: Normal respiratory effort.  No retractions. Lungs CTAB. Gastrointestinal: Soft and nontender. No distention.  Musculoskeletal: No lower extremity tenderness nor edema. No gross deformities of extremities. Neurologic:  Normal speech and language. No gross focal neurologic deficits are appreciated.  Skin:  Skin is warm, dry and intact. No rash noted.  ____________________________________________   LABS (all labs ordered are listed, but only abnormal results are displayed)  Labs Reviewed  COMPREHENSIVE METABOLIC PANEL - Abnormal; Notable for the following components:      Result Value   Glucose, Bld 118 (*)    All other components within normal limits  TSH - Abnormal; Notable for the following components:   TSH 5.278 (*)    All other components within normal limits  CBC WITH DIFFERENTIAL/PLATELET  MAGNESIUM   ____________________________________________  EKG   EKG Interpretation  Date/Time:  Wednesday Jan 22 2023 16:46:03 EDT Ventricular Rate:  106 PR Interval:    QRS Duration: 103 QT Interval:  386 QTC Calculation: 513 R Axis:   62 Text Interpretation: Atrial flutter Rate improved  Confirmed by Alona Bene [phone]) on 01/22/2023 4:50:42 PM         ____________________________________________   PROCEDURES  Procedure(s) performed:   .Cardioversion  Date/Time: 01/22/2023 7:25 PM  Performed by: Maia Plan, MD Authorized by: Maia Plan, MD   Consent:    Consent obtained:  Written   Consent given by:  Patient   Risks discussed:  Cutaneous burn, death, induced arrhythmia and pain   Alternatives discussed:  No treatment Pre-procedure details:    Cardioversion basis:  Emergent   Rhythm:  Atrial flutter   Electrode placement:  Anterior-posterior Patient sedated: Yes. Refer to sedation procedure documentation for details of sedation.  Attempt one:    Cardioversion mode:  Synchronous   Shock (Joules):  100   Shock outcome:  Conversion to normal sinus rhythm Post-procedure details:    Patient status:  Awake   Patient tolerance of procedure:  Tolerated well, no immediate complications .Critical Care  Performed by: Maia Plan, MD Authorized by: Maia Plan, MD   Critical care provider statement:    Critical care time (minutes):  35   Critical care time was exclusive of:  Separately billable procedures and treating other patients and teaching time   Critical care was necessary to treat or prevent imminent or life-threatening deterioration of the following conditions:  Circulatory failure   Critical care was time spent personally by me on the following activities:  Development of treatment plan with patient or surrogate, discussions with consultants, evaluation of patient's response to treatment, examination of patient, ordering and review of laboratory studies, ordering and review of radiographic studies, ordering and performing treatments and interventions, pulse oximetry, re-evaluation of patient's condition and review of old charts   I assumed direction of critical care for this patient from another provider in my specialty: no     Care discussed  with: admitting provider   .Sedation  Date/Time: 01/22/2023 7:26 PM  Performed by: Maia Plan, MD Authorized by: Maia Plan, MD   Consent:    Consent obtained:  Written   Consent given by:  Patient   Risks discussed:  Allergic reaction, dysrhythmia, inadequate sedation, nausea, vomiting, respiratory compromise necessitating ventilatory assistance and intubation, prolonged sedation necessitating reversal and prolonged hypoxia resulting in organ damage   Alternatives discussed:  Analgesia without sedation Universal protocol:    Immediately prior to procedure, a time out was called: yes     Patient identity confirmed:  Anonymous protocol, patient vented/unresponsive, arm band and hospital-assigned identification number Pre-sedation assessment:    Time since last food or drink:  35   ASA classification: class 1 - normal, healthy patient     Mallampati score:  II - soft palate, uvula, fauces visible   Pre-sedation assessments completed and reviewed: airway patency, cardiovascular function, hydration status, mental status, nausea/vomiting, pain level, respiratory function and temperature   Immediate pre-procedure details:    Reassessment: Patient reassessed immediately prior to procedure     Reviewed: vital signs, relevant labs/tests and NPO status     Verified: bag valve mask available, emergency equipment available, intubation equipment available, IV patency confirmed, oxygen available and suction available   Procedure details (see MAR for exact dosages):  Preoxygenation:  Nasal cannula   Sedation:  Etomidate   Intended level of sedation: deep   Analgesia:  None   Intra-procedure monitoring:  Blood pressure monitoring, cardiac monitor, continuous pulse oximetry, continuous capnometry, frequent LOC assessments and frequent vital sign checks   Intra-procedure events: none     Total Provider sedation time (minutes):  23 Post-procedure details:    Attendance: Constant attendance by  certified staff until patient recovered     Recovery: Patient returned to pre-procedure baseline     Post-sedation assessments completed and reviewed: airway patency, cardiovascular function, hydration status, mental status, nausea/vomiting, pain level, respiratory function and temperature     Patient is stable for discharge or admission: yes     Procedure completion:  Tolerated well, no immediate complications    ____________________________________________   INITIAL IMPRESSION / ASSESSMENT AND PLAN / ED COURSE  Pertinent labs & imaging results that were available during my care of the patient were reviewed by me and considered in my medical decision making (see chart for details).   This patient is Presenting for Evaluation of palpitations, which does require a range of treatment options, and is a complaint that involves a high risk of morbidity and mortality.  The Differential Diagnoses includes but is not exclusive to acute coronary syndrome, aortic dissection, pulmonary embolism, cardiac tamponade, community-acquired pneumonia, pericarditis, musculoskeletal chest wall pain, etc.   Critical Interventions-    Medications  sodium chloride 0.9 % bolus 500 mL (0 mLs Intravenous Stopped 01/22/23 1843)  metoprolol tartrate (LOPRESSOR) injection 5 mg (5 mg Intravenous Given 01/22/23 1634)  metoprolol tartrate (LOPRESSOR) injection 2.5 mg (2.5 mg Intravenous Given 01/22/23 1656)  etomidate (AMIDATE) injection (8 mg Intravenous Given 01/22/23 1916)    Reassessment after intervention: HR improved while awaiting labs but no rhythm change.    I did obtain Additional Historical Information from wife at bedside.  I decided to review pertinent External Data, and in summary patient seen in A fib clinic office.   Clinical Laboratory Tests Ordered, included CMP with normal K. No AKI. TSH slightly elevated. Mag normal.   Cardiac Monitor Tracing which shows SVT vs flutter   Social Determinants of  Health Risk patient is not an active smoker.   Medical Decision Making: Summary:  Patient presents emergency department from the A-fib clinic with rapid heart rate.  EKG on arrival shows narrow complex tachycardia at a rate of 170s.  Appears to be underlying flutter waves.  I was able to confirm this by performing vagal maneuvers at the bedside.  The patient had his heart rate decreased into the 70s with clear underlying flutter waves evident at that time.  He did return to HR in the 170s shortly afterwards.  Blood pressure normal.  Symptoms are fairly mild despite his elevated heart rate.  Plan for labs and cardioversion which we discussed and he is in agreement with moving forward.   Reevaluation with update and discussion with patient and wife at 04:40 PM. HR improved with 5mg  IV metoprolol. Underlying flutter now more evident. Labs pending.   Considered admission but patient was cardioverted without complication. Feeling well. Tolerating PO. Stable for discharge.   Patient's presentation is most consistent with acute presentation with potential threat to life or bodily function.   Disposition: discharge  ____________________________________________  FINAL CLINICAL IMPRESSION(S) / ED DIAGNOSES  Final diagnoses:  Atrial flutter with rapid ventricular response (HCC)    Note:  This document was prepared using Dragon voice recognition software and may include  unintentional dictation errors.  Alona Bene, MD, [facility] Emergency Medicine    Emalee Knies, Arlyss Repress, MD 01/23/23 1600

## 2023-01-22 NOTE — Progress Notes (Signed)
Primary Care Physician: Alicia Amel, MD Referring Physician: Dr. Haze Justin Sr. is a 67 y.o. male with a h/o moderate non obstructive CAD in 2006 complicated by coronary vasospasm, alcohol use, new onset  atrial fibrillation, and obesity, as well as new heart failure with reduced EF.  He was seen by Dr. Ronette Deter 08/16/22 with c/o of exertional dyspnea and was in afib with RVR. He had a cardioversion 12/15 which was successful. He is now in the afib clinic for f/u . EKG today shows SR. He states that he feels improved but still at times feels short of breath. EF by echo performed while  in afib with RVR was 30-35%. He has given up alcohol and lost 17 lbs over the last 2 weeks. Since giving up alcohol and weight loss, his wife states that he is no longer snoring or having apnea spells. He is complaint with anticoagulation. Minimal caffeine and no tobacco.   On follow up today, he is in what appears to be atrial flutter. He admits to being in this abnormal rhythm since Monday. He is presyncopal and almost passed out earlier today. He can feel his heart beating rapidly and is short of breath with walking. Denies chest pain. He has not missed any doses of Eliquis 5 mg BID. The last time he has eaten today was around noon.   He notes he has not had any alcohol since December.   Today, he denies symptoms of palpitations, chest pain, shortness of breath, orthopnea, PND, lower extremity edema, dizziness, presyncope, syncope, or neurologic sequela. The patient is tolerating medications without difficulties and is otherwise without complaint today.   Past Medical History:  Diagnosis Date   Acute hearing loss of left ear 03/12/2021   Allergy    Allergic rhinitis   Angina pectoris (HCC) 09/16/2004   Known coronary vasospasm -- treated with combination of long-acting nitrates and calcium channel blockers; NTG when necessary   CAD (coronary artery disease) 04/16/2005   Mild  obstructive ramus intermedius disease (30-50%) and 50-60% proximal   Chronic back pain    Stemming from prior lumbar diskectomy   GERD (gastroesophageal reflux disease)    Hyperlipidemia    Hypertension    Obesity    Substance abuse (HCC)    Known chronic alcoholic - not interested in quitting (current as of 2014)   Past Surgical History:  Procedure Laterality Date   BACK SURGERY  1989, 1997   Chronic back pain stemming from this   CARDIOVASCULAR STRESS TEST  2005   Dr. Verdis Prime -- Cardiolite stress test showed no evidence of ischemia   CARDIOVERSION N/A 08/30/2022   Procedure: CARDIOVERSION;  Surgeon: Lewayne Bunting, MD;  Location: [facility] ENDOSCOPY;  Service: Cardiovascular;  Laterality: N/A;   COLONOSCOPY W/ BIOPSIES  2011   Multiple small polyps and s/p polypectomy -- recommended repeat in 2 years due to multiple small polyps   ESOPHAGEAL DILATION  1997    Current Outpatient Medications  Medication Sig Dispense Refill   acetaminophen (TYLENOL) 500 MG tablet Take 500 mg by mouth daily.     allopurinol (ZYLOPRIM) 100 MG tablet TAKE 1 TABLET(100 MG) BY MOUTH DAILY 90 tablet 2   amLODipine (NORVASC) 10 MG tablet TAKE 1 TABLET(10 MG) BY MOUTH DAILY 90 tablet 3   apixaban (ELIQUIS) 5 MG TABS tablet TAKE 1 TABLET(5 MG) BY MOUTH TWICE DAILY 60 tablet 5   busPIRone (BUSPAR) 30 MG tablet TAKE 1 TABLET(30 MG)  BY MOUTH TWICE DAILY 180 tablet 3   empagliflozin (JARDIANCE) 10 MG TABS tablet Take 1 tablet (10 mg total) by mouth daily. 90 tablet 3   HYDROcodone-acetaminophen (NORCO/VICODIN) 5-325 MG tablet Take 1 tablet by mouth 2 (two) times daily as needed. 60 tablet 0   isosorbide mononitrate (IMDUR) 60 MG 24 hr tablet TAKE 1 TABLET(60 MG) BY MOUTH DAILY 90 tablet 3   losartan (COZAAR) 25 MG tablet Take 1 tablet (25 mg total) by mouth daily. 90 tablet 3   metoprolol succinate (TOPROL-XL) 50 MG 24 hr tablet Take 1 tablet (50 mg total) by mouth daily at 6 (six) AM. 30 tablet 2   Multiple  Vitamin (MULTIVITAMIN WITH MINERALS) TABS tablet Take 1 tablet by mouth daily.     naloxone (NARCAN) nasal spray 4 mg/0.1 mL To be used only in case of suspected opioid overdose 1 each 0   nitroGLYCERIN (NITROSTAT) 0.4 MG SL tablet PLACE 1 TABLET UNDER THE TONGUE IF NEEDED FOR CHEST PAIN EVERY 5 MINUTES FOR 3 DOSES. IF NO RELIEF AFTER 3RD DOSE CALL MD OR 911 25 tablet 0   omeprazole (PRILOSEC) 20 MG capsule TAKE 1 CAPSULE(20 MG) BY MOUTH DAILY 30 capsule 0   OVER THE COUNTER MEDICATION Apply 1 Application topically at bedtime as needed (pain). CBD lotion     pravastatin (PRAVACHOL) 40 MG tablet TAKE 1 TABLET(40 MG) BY MOUTH DAILY 90 tablet 3   pregabalin (LYRICA) 25 MG capsule TAKE 1 CAPSULE(25 MG) BY MOUTH TWICE DAILY 60 capsule 3   No current facility-administered medications for this visit.    Allergies  Allergen Reactions   Diphenhydramine Hcl     Facial swelling   Zantac [Ranitidine Hcl]     Hiccups for 3-4 days    Social History   Socioeconomic History   Marital status: Married    Spouse name: Eber Jones   Number of children: 2   Years of education: 8   Highest education level: 8th grade  Occupational History   Occupation: Disabled   Tobacco Use   Smoking status: Former    Packs/day: 2    Types: Cigarettes    Passive exposure: Past   Smokeless tobacco: Former    Quit date: 1991   Tobacco comments:    2PPD smoker until 1991  Vaping Use   Vaping Use: Never used  Substance and Sexual Activity   Alcohol use: Yes    Alcohol/week: 3.0 standard drinks of alcohol    Types: 3 Cans of beer per week   Drug use: Not Currently   Sexual activity: Yes  Other Topics Concern   Not on file  Social History Narrative   Patient lives with his wife Eber Jones.    Patient has 2 adult children, with 6 grandchildren, 1 great grandchild.    Patients family lives all on the same street.    Patient enjoys being outside, fixing up his home, and spending time with his family.    Social  Determinants of Health   Financial Resource Strain: Low Risk  (11/16/2020)   Overall Financial Resource Strain (CARDIA)    Difficulty of Paying Living Expenses: Not hard at all  Food Insecurity: No Food Insecurity (11/16/2020)   Hunger Vital Sign    Worried About Running Out of Food in the Last Year: Never true    Ran Out of Food in the Last Year: Never true  Transportation Needs: No Transportation Needs (11/16/2020)   PRAPARE - Administrator, Civil Service (  Medical): No    Lack of Transportation (Non-Medical): No  Physical Activity: Inactive (11/16/2020)   Exercise Vital Sign    Days of Exercise per Week: 0 days    Minutes of Exercise per Session: 0 min  Stress: No Stress Concern Present (11/16/2020)   [employer] of Occupational Health - Occupational Stress Questionnaire    Feeling of Stress : Only a little  Social Connections: Moderately Isolated (11/16/2020)   Social Connection and Isolation Panel [NHANES]    Frequency of Communication with Friends and Family: More than three times a week    Frequency of Social Gatherings with Friends and Family: More than three times a week    Attends Religious Services: Never    Database administrator or Organizations: No    Attends Banker Meetings: Never    Marital Status: Married  Catering manager Violence: Not At Risk (11/16/2020)   Humiliation, Afraid, Rape, and Kick questionnaire    Fear of Current or Ex-Partner: No    Emotionally Abused: No    Physically Abused: No    Sexually Abused: No    Family History  Problem Relation Age of Onset   Heart failure Mother    Heart failure Father    Heart disease Father     ROS- All systems are reviewed and negative except as per the HPI above  Physical Exam: There were no vitals filed for this visit.  Wt Readings from Last 3 Encounters:  12/10/22 96.6 kg  12/06/22 96.8 kg  10/10/22 95.8 kg    Labs: Lab Results  Component Value Date   NA 141 08/16/2022   K 4.2  08/16/2022   CL 101 08/16/2022   CO2 23 08/16/2022   GLUCOSE 86 08/16/2022   BUN 15 08/16/2022   CREATININE 0.99 08/16/2022   CALCIUM 9.8 08/16/2022   No results found for: "INR" Lab Results  Component Value Date   CHOL 174 10/07/2022   HDL 38 (L) 10/07/2022   LDLCALC 93 10/07/2022   TRIG 258 (H) 10/07/2022     GEN- The patient appears diaphoretic, alert and oriented x 3 today.   Head- normocephalic, atraumatic Eyes-  Sclera clear, conjunctiva pink Ears- hearing intact Oropharynx- clear Neck- supple, no JVP Lymph- no cervical lymphadenopathy Lungs- Clear to ausculation bilaterally, normal work of breathing Heart- Tachycardic Regular rate and rhythm, no murmurs, rubs or gallops, PMI not laterally displaced GI- soft, NT, ND, + BS Extremities- no clubbing, cyanosis, or edema MS- no significant deformity or atrophy Skin- no rash or lesion Psych- euthymic mood, full affect Neuro- strength and sensation are intact   EKG- Vent. rate 171 BPM PR interval * ms QRS duration 92 ms QT/QTcB 296/499 ms P-R-T axes * 72 130 Supraventricular tachycardia ST & T wave abnormality, consider inferior ischemia Abnormal ECG When compared with ECG of 06-Sep-2022 08:51, PREVIOUS ECG IS PRESENT  ECHO 01/03/23: IMPRESSIONS     1. Left ventricular ejection fraction by 3D volume is 62 %. The left  ventricle has no regional wall motion abnormalities.   2. Mild mitral valve regurgitation.   3. The inferior vena cava is normal in size with greater than 50%  respiratory variability, suggesting right atrial pressure of 3 mmHg.   Comparison(s): Prior images reviewed side by side. The left ventricular  function has improved.   Assessment and Plan:  1. Atrial fibrillation / atrial flutter  He appears to be in a very rapid atrial flutter today. He appears unwell today and  has stated he has been presyncopal today. After discussion with patient and his wife, agreed that he requires urgent  attention for this arrhythmia and will proceed to the ED via wheelchair.  2. CHA2DS2/VAS  score of 4 Continue eliquis 5 mg bid. No missed doses  3. HTN Stable    Pt sent down to ED for consideration of emergent cardioversion.  Lake Bells, PA-C Afib Clinic [facility] [address] [place], [place] [address] [phone]

## 2023-01-22 NOTE — Discharge Instructions (Signed)
Please continue your home medications as prescribed and follow closely with your cardiology doctors.  We were able to cardiovert you out of the atrial flutter rhythm.  If you develop any new or suddenly worsening symptoms please return for re-evaluation.

## 2023-01-22 NOTE — ED Notes (Signed)
BP 162/118  HR  [id number]  [id number]

## 2023-01-22 NOTE — ED Notes (Signed)
131/105 Bp   HR 96    TACHY

## 2023-01-22 NOTE — ED Triage Notes (Signed)
Pt brought in from Afib clinic due to HR up to 180's.  Pt reports SHOB but no chest pain.

## 2023-01-24 ENCOUNTER — Telehealth: Payer: Self-pay | Admitting: Internal Medicine

## 2023-01-24 NOTE — Telephone Encounter (Signed)
Wife is calling to see if patient need to make appt with dr due to his recent visit to the ER. Please advise

## 2023-01-24 NOTE — Telephone Encounter (Signed)
Called pt spouse okay per DPR advised pt should f/u with Afib clinic.  Pt already scheduled for 01/30/23.  Advised this appointment is fine if pt needs further assistance Afib clinic will reach out to MD.  All questions answered.

## 2023-01-29 ENCOUNTER — Telehealth: Payer: Self-pay | Admitting: *Deleted

## 2023-01-29 NOTE — Telephone Encounter (Signed)
Transition Care Management Follow-up Telephone Call Date of discharge and from where: [facility] 01/22/2023 How have you been since you were released from the hospital? 7 days Any questions or concerns? Yes  Items Reviewed: Did the pt receive and understand the discharge instructions provided? Yes  Medications obtained and verified? Yes  Other? Yes  Any new allergies since your discharge? No  Dietary orders reviewed? No Do you have support at home? Yes       Follow up appointments reviewed:  PCP Hospital f/u appt confirmed? Yes   Specialist Hospital f/u appt confirmed? Yes   Are transportation arrangements needed? No  If their condition worsens, is the pt aware to call PCP or go to the Emergency Dept.? Yes Was the patient provided with contact information for the PCP's office or ED? Yes Was to pt encouraged to call back with questions or concerns? Yes

## 2023-01-30 ENCOUNTER — Ambulatory Visit (HOSPITAL_COMMUNITY)
Admission: RE | Admit: 2023-01-30 | Discharge: 2023-01-30 | Disposition: A | Discharge status: Home / Self Care | Payer: Medicare Other | Source: Ambulatory Visit | Attending: Internal Medicine | Admitting: Internal Medicine

## 2023-01-30 VITALS — BP 152/80 | HR 53 | Ht 72.0 in | Wt 206.8 lb

## 2023-01-30 DIAGNOSIS — Z7901 Long term (current) use of anticoagulants: Secondary | ICD-10-CM | POA: Diagnosis not present

## 2023-01-30 DIAGNOSIS — Z87891 Personal history of nicotine dependence: Secondary | ICD-10-CM | POA: Diagnosis not present

## 2023-01-30 DIAGNOSIS — I2510 Atherosclerotic heart disease of native coronary artery without angina pectoris: Secondary | ICD-10-CM | POA: Insufficient documentation

## 2023-01-30 DIAGNOSIS — Z8249 Family history of ischemic heart disease and other diseases of the circulatory system: Secondary | ICD-10-CM | POA: Insufficient documentation

## 2023-01-30 DIAGNOSIS — I4892 Unspecified atrial flutter: Secondary | ICD-10-CM | POA: Insufficient documentation

## 2023-01-30 DIAGNOSIS — I480 Paroxysmal atrial fibrillation: Secondary | ICD-10-CM | POA: Diagnosis not present

## 2023-01-30 DIAGNOSIS — I10 Essential (primary) hypertension: Secondary | ICD-10-CM | POA: Insufficient documentation

## 2023-01-30 DIAGNOSIS — D6869 Other thrombophilia: Secondary | ICD-10-CM | POA: Diagnosis not present

## 2023-01-30 DIAGNOSIS — I4891 Unspecified atrial fibrillation: Secondary | ICD-10-CM | POA: Diagnosis present

## 2023-01-30 NOTE — Progress Notes (Signed)
Primary Care Physician: Alicia Amel, MD Referring Physician: Dr. Haze Justin Sr. is a 67 y.o. male with a h/o moderate non obstructive CAD in 2006 complicated by coronary vasospasm, alcohol use, new onset  atrial fibrillation, and obesity, as well as new heart failure with reduced EF.  He was seen by Dr. Ronette Deter 08/16/22 with c/o of exertional dyspnea and was in afib with RVR. He had a cardioversion 12/15 which was successful. He is now in the afib clinic for f/u . EKG today shows SR. He states that he feels improved but still at times feels short of breath. EF by echo performed while  in afib with RVR was 30-35%. He has given up alcohol and lost 17 lbs over the last 2 weeks. Since giving up alcohol and weight loss, his wife states that he is no longer snoring or having apnea spells. He is complaint with anticoagulation. Minimal caffeine and no tobacco.   On follow up 01/22/23, he is in what appears to be atrial flutter with RVR 171 bpm. He admits to being in this abnormal rhythm since Monday. He is presyncopal and almost passed out earlier today. He can feel his heart beating rapidly and is short of breath with walking. Denies chest pain. He has not missed any doses of Eliquis 5 mg BID. The last time he has eaten today was around noon.   He notes he has not had any alcohol since December.   On follow up 01/30/23, he is in NSR. He is s/p ED evaluation after our office visit on 01/22/23. He underwent emergent cardioversion in the ED and was successfully converted to NSR. He notes he was very short of breath when out of rhythm and this has improved. He is still short of breath but it is better. He has not missed any doses of anticoagulation.  Today, he denies symptoms of palpitations, chest pain, orthopnea, PND, lower extremity edema, dizziness, presyncope, syncope, or neurologic sequela. The patient is tolerating medications without difficulties and is otherwise without  complaint today.   Past Medical History:  Diagnosis Date   Acute hearing loss of left ear 03/12/2021   Allergy    Allergic rhinitis   Angina pectoris (HCC) 09/16/2004   Known coronary vasospasm -- treated with combination of long-acting nitrates and calcium channel blockers; NTG when necessary   CAD (coronary artery disease) 04/16/2005   Mild obstructive ramus intermedius disease (30-50%) and 50-60% proximal   Chronic back pain    Stemming from prior lumbar diskectomy   GERD (gastroesophageal reflux disease)    Hyperlipidemia    Hypertension    Obesity    Substance abuse (HCC)    Known chronic alcoholic - not interested in quitting (current as of 2014)   Past Surgical History:  Procedure Laterality Date   BACK SURGERY  1989, 1997   Chronic back pain stemming from this   CARDIOVASCULAR STRESS TEST  2005   Dr. Verdis Prime -- Cardiolite stress test showed no evidence of ischemia   CARDIOVERSION N/A 08/30/2022   Procedure: CARDIOVERSION;  Surgeon: Lewayne Bunting, MD;  Location: [facility] ENDOSCOPY;  Service: Cardiovascular;  Laterality: N/A;   COLONOSCOPY W/ BIOPSIES  2011   Multiple small polyps and s/p polypectomy -- recommended repeat in 2 years due to multiple small polyps   ESOPHAGEAL DILATION  1997    Current Outpatient Medications  Medication Sig Dispense Refill   acetaminophen (TYLENOL) 500 MG tablet Take 500 mg by  mouth daily.     allopurinol (ZYLOPRIM) 100 MG tablet TAKE 1 TABLET(100 MG) BY MOUTH DAILY 90 tablet 2   amLODipine (NORVASC) 10 MG tablet TAKE 1 TABLET(10 MG) BY MOUTH DAILY 90 tablet 3   apixaban (ELIQUIS) 5 MG TABS tablet TAKE 1 TABLET(5 MG) BY MOUTH TWICE DAILY 60 tablet 5   busPIRone (BUSPAR) 30 MG tablet TAKE 1 TABLET(30 MG) BY MOUTH TWICE DAILY 180 tablet 3   empagliflozin (JARDIANCE) 10 MG TABS tablet Take 1 tablet (10 mg total) by mouth daily. 90 tablet 3   HYDROcodone-acetaminophen (NORCO/VICODIN) 5-325 MG tablet Take 1 tablet by mouth 2 (two) times daily  as needed. 60 tablet 0   isosorbide mononitrate (IMDUR) 60 MG 24 hr tablet TAKE 1 TABLET(60 MG) BY MOUTH DAILY 90 tablet 3   losartan (COZAAR) 25 MG tablet Take 1 tablet (25 mg total) by mouth daily. 90 tablet 3   metoprolol succinate (TOPROL-XL) 50 MG 24 hr tablet Take 1 tablet (50 mg total) by mouth daily at 6 (six) AM. 30 tablet 2   Multiple Vitamin (MULTIVITAMIN WITH MINERALS) TABS tablet Take 1 tablet by mouth daily.     naloxone (NARCAN) nasal spray 4 mg/0.1 mL To be used only in case of suspected opioid overdose 1 each 0   nitroGLYCERIN (NITROSTAT) 0.4 MG SL tablet PLACE 1 TABLET UNDER THE TONGUE IF NEEDED FOR CHEST PAIN EVERY 5 MINUTES FOR 3 DOSES. IF NO RELIEF AFTER 3RD DOSE CALL MD OR 911 25 tablet 0   omeprazole (PRILOSEC) 20 MG capsule TAKE 1 CAPSULE(20 MG) BY MOUTH DAILY 30 capsule 0   OVER THE COUNTER MEDICATION Apply 1 Application topically at bedtime as needed (pain). CBD lotion     pravastatin (PRAVACHOL) 40 MG tablet TAKE 1 TABLET(40 MG) BY MOUTH DAILY 90 tablet 3   pregabalin (LYRICA) 25 MG capsule TAKE 1 CAPSULE(25 MG) BY MOUTH TWICE DAILY 60 capsule 3   No current facility-administered medications for this visit.    Allergies  Allergen Reactions   Diphenhydramine Hcl     Facial swelling   Zantac [Ranitidine Hcl]     Hiccups for 3-4 days    Social History   Socioeconomic History   Marital status: Married    Spouse name: Eber Jones   Number of children: 2   Years of education: 8   Highest education level: 8th grade  Occupational History   Occupation: Disabled   Tobacco Use   Smoking status: Former    Packs/day: 2    Types: Cigarettes    Passive exposure: Past   Smokeless tobacco: Former    Quit date: 1991   Tobacco comments:    2PPD smoker until 1991  Vaping Use   Vaping Use: Never used  Substance and Sexual Activity   Alcohol use: Yes    Alcohol/week: 3.0 standard drinks of alcohol    Types: 3 Cans of beer per week   Drug use: Not Currently   Sexual  activity: Yes  Other Topics Concern   Not on file  Social History Narrative   Patient lives with his wife Eber Jones.    Patient has 2 adult children, with 6 grandchildren, 1 great grandchild.    Patients family lives all on the same street.    Patient enjoys being outside, fixing up his home, and spending time with his family.    Social Determinants of Health   Financial Resource Strain: Low Risk  (11/16/2020)   Overall Financial Resource Strain (CARDIA)  Difficulty of Paying Living Expenses: Not hard at all  Food Insecurity: No Food Insecurity (11/16/2020)   Hunger Vital Sign    Worried About Running Out of Food in the Last Year: Never true    Ran Out of Food in the Last Year: Never true  Transportation Needs: No Transportation Needs (11/16/2020)   PRAPARE - Administrator, Civil Service (Medical): No    Lack of Transportation (Non-Medical): No  Physical Activity: Inactive (11/16/2020)   Exercise Vital Sign    Days of Exercise per Week: 0 days    Minutes of Exercise per Session: 0 min  Stress: No Stress Concern Present (11/16/2020)   [employer] of Occupational Health - Occupational Stress Questionnaire    Feeling of Stress : Only a little  Social Connections: Moderately Isolated (11/16/2020)   Social Connection and Isolation Panel [NHANES]    Frequency of Communication with Friends and Family: More than three times a week    Frequency of Social Gatherings with Friends and Family: More than three times a week    Attends Religious Services: Never    Database administrator or Organizations: No    Attends Banker Meetings: Never    Marital Status: Married  Catering manager Violence: Not At Risk (11/16/2020)   Humiliation, Afraid, Rape, and Kick questionnaire    Fear of Current or Ex-Partner: No    Emotionally Abused: No    Physically Abused: No    Sexually Abused: No    Family History  Problem Relation Age of Onset   Heart failure Mother    Heart failure  Father    Heart disease Father     ROS- All systems are reviewed and negative except as per the HPI above  Physical Exam: There were no vitals filed for this visit.  Wt Readings from Last 3 Encounters:  01/22/23 94.8 kg  01/22/23 95.2 kg  12/10/22 96.6 kg    Labs: Lab Results  Component Value Date   NA 140 01/22/2023   K 4.5 01/22/2023   CL 105 01/22/2023   CO2 22 01/22/2023   GLUCOSE 118 (H) 01/22/2023   BUN 18 01/22/2023   CREATININE 1.09 01/22/2023   CALCIUM 10.0 01/22/2023   MG 1.9 01/22/2023   No results found for: "INR" Lab Results  Component Value Date   CHOL 174 10/07/2022   HDL 38 (L) 10/07/2022   LDLCALC 93 10/07/2022   TRIG 258 (H) 10/07/2022    GEN- The patient is well appearing, alert and oriented x 3 today.   Head- normocephalic, atraumatic Eyes-  Sclera clear, conjunctiva pink Ears- hearing intact Oropharynx- clear Neck- supple, no JVP Lymph- no cervical lymphadenopathy Lungs- Clear to ausculation bilaterally, normal work of breathing Heart- Regular bradycardic rate and rhythm, no murmurs, rubs or gallops, PMI not laterally displaced GI- soft, NT, ND, + BS Extremities- no clubbing, cyanosis, or edema MS- no significant deformity or atrophy Skin- no rash or lesion Psych- euthymic mood, full affect Neuro- strength and sensation are intact    EKG- Vent. rate 53 BPM PR interval 128 ms QRS duration 104 ms QT/QTcB 438/410 ms P-R-T axes 59 48 62 Sinus bradycardia Nonspecific T wave abnormality Abnormal ECG When compared with ECG of 22-Jan-2023 19:19, PREVIOUS ECG IS PRESENT  ECHO 01/03/23: IMPRESSIONS     1. Left ventricular ejection fraction by 3D volume is 62 %. The left  ventricle has no regional wall motion abnormalities.   2. Mild mitral valve regurgitation.  3. The inferior vena cava is normal in size with greater than 50%  respiratory variability, suggesting right atrial pressure of 3 mmHg.   Comparison(s): Prior images  reviewed side by side. The left ventricular  function has improved.   Assessment and Plan:  1. Atrial fibrillation / atrial flutter S/p DCCV on 01/22/23.   He is in NSR.   We discussed potential medication treatment and also ablation. After discussion, he is interested in speaking with EP about ablation.   Will go ahead and schedule him to discuss ablation.  He has stopped drinking alcohol per previous cardiology recommendations.   2. CHA2DS2/VAS  score of 4 Continue eliquis 5 mg bid. No missed doses  3. HTN Elevated, advised to trend at home and call us next week or cardiology if persistently elevated.    Will schedule with EP to discuss ablation.   Lake Bells, PA-C Afib Clinic [facility] [address] [place], [place] [address] [phone]

## 2023-02-11 ENCOUNTER — Other Ambulatory Visit: Payer: Self-pay | Admitting: Student

## 2023-02-12 ENCOUNTER — Other Ambulatory Visit: Payer: Self-pay | Admitting: Student

## 2023-02-17 NOTE — Progress Notes (Unsigned)
Cardiology Office Note:    Date:  02/18/2023  ID:  Fulton Mole Sr., DOB 06-21-1956, MRN [id number] PCP: Alicia Amel, MD  [facility] Providers Cardiologist:  Christell Constant, MD       Patient Profile:      Coronary artery disease  Cath 04/2005: oD1 50, RI 30-50; LAD myocardial bridging; LAD vasospasm  Persistent atrial fibrillation/flutter  S/p DCCV 08/2022; 01/2023 HFimpEF (heart failure with improved ejection fraction)  TTE 08/12/22:  EF 30-35, global HK, mildly reduced RVSF, mildly elevated PASP, RVSP 36.2, mild LAE, mod RAE, mild MR, mild dilation of ascending aorta (41 mm), RAP 15 Limited TTE 01/03/23: EF 62, no RWMA, mild MR, RAP 3 Hypertension  Hyperlipidemia  Obesity  ETOH use       History of Present Illness:   CLAYTEN EUGENIO Sr. is a 67 y.o. male who returns for post hospital f/u. He was last seen by Dr. Izora Ribas 12/06/22. A f/u echocardiogram after that visit demonstrated return of normal LVF. He was seen back in the [facility] 01/22/23 and was back in AFib/Flutter w RVR. He was sent to the ED. TSH was elevated at 5.278. HR was in the 170s. He underwent DCCV w return of NSR.  He was still in NSR at [facility] f/u 01/30/23. He has been referred to EP to discuss ablation.  He has an appointment with Dr. Elberta Fortis July 26.    He is here today with his wife.  He has not had any further tachycardia.  He brings a list of his blood pressures.  His pressures at home average 130s/80s.  He continues to have dyspnea with exertion.  He has had this for about a year.  He describes NYHA II-IIb symptoms.  He has not had orthopnea.  He has awoken short of breath in the past.  His wife notes that he does not snore.  She has not witnessed apnea.  He denies daytime hypersomnolence.  He has occasional chest discomfort.  This is related to vasospasm.  He has had this for years without change.  He does not describe exertional jaw pain, arm pain or back pain.  Review of Systems   Gastrointestinal:  Negative for hematochezia and melena.  Genitourinary:  Negative for hematuria.   See the HPI    Studies Reviewed:    EKG:  not done  Risk Assessment/Calculations:    CHA2DS2-VASc Score = 4   This indicates a 4.8% annual risk of stroke. The patient's score is based upon: CHF History: 1 HTN History: 1 Diabetes History: 0 Stroke History: 0 Vascular Disease History: 1 Age Score: 1 Gender Score: 0            Physical Exam:   VS:  BP 130/82   Pulse 68   Ht 6' (1.829 m)   Wt 207 lb (93.9 kg)   SpO2 98%   BMI 28.07 kg/m    Wt Readings from Last 3 Encounters:  02/18/23 207 lb (93.9 kg)  01/30/23 206 lb 12.8 oz (93.8 kg)  01/22/23 209 lb (94.8 kg)    Constitutional:      Appearance: Healthy appearance. Not in distress.  Neck:     Vascular: JVD normal.  Pulmonary:     Breath sounds: Normal breath sounds. No wheezing. No rales.  Cardiovascular:     Normal rate. Regular rhythm. Normal S1. Normal S2.      Murmurs: There is no murmur.  Edema:    Peripheral edema  absent.  Abdominal:     Palpations: Abdomen is soft.       ASSESSMENT AND PLAN:   SOB (shortness of breath) He notes shortness of breath for the past year.  It is not getting any worse.  He describes NYHA II-IIb symptoms.  His ejection fraction improved by most recent limited echo in April 2024.  He does not display signs of congestive heart failure on exam.  He does not describe symptoms consistent with sleep apnea.  He is an ex-smoker.  He quit in 1991.  He does have a history of moderate nonobstructive coronary artery disease.  He also has a history of vasospasm.  Question if his dyspnea could be an anginal equivalent.  I have recommended proceeding with a Lexiscan Myoview to rule out ischemia.  I will also obtain a BMET, BNP.  If his BNP is significantly better, I will adjust his diuretics.  If his stress test is normal and his BNP is normal, consider referral to pulmonology for evaluation of  shortness of breath.  Atrial fibrillation (HCC) Status post recent cardioversion.  He is maintaining sinus rhythm.  He is tolerating anticoagulation.  Labs from the emergency room were reviewed.  His creatinine and hemoglobin were normal.  Continue Eliquis 5 mg twice daily, Toprol-XL 50 mg daily.  As noted, he has an appointment with Dr. Elberta Fortis in July.  Coronary atherosclerosis Nonobstructive coronary artery disease in 2006.  As noted, I have recommended a Lexiscan Myoview to rule out ischemia as a cause for his shortness of breath with exertion.  He is not on antiplatelet therapy as he is on Eliquis.  Continue Imdur 60 mg daily, pravastatin 40 mg daily.  Hypertriglyceridemia LDL in January was above goal at 93.  Continue pravastatin 40 mg daily.  Obtain direct LDL today.  If LDL still above 70, consider increasing pravastatin 80 mg daily.  Heart failure with improved ejection fraction (HFimpEF) (HCC) EF improved in sinus rhythm and after discontinuation of alcohol.  Continue losartan 25 mg daily, Toprol-XL 50 mg daily, Jardiance 10 mg daily.  Essential hypertension, benign Blood pressure is controlled.  Continue amlodipine 10 mg daily, Imdur 60 mg daily, losartan 25 mg daily, Toprol-XL 50 mg daily    Informed Consent   Shared Decision Making/Informed Consent The risks [chest pain, shortness of breath, cardiac arrhythmias, dizziness, blood pressure fluctuations, myocardial infarction, stroke/transient ischemic attack, nausea, vomiting, allergic reaction, radiation exposure, metallic taste sensation and life-threatening complications (estimated to be 1 in 10,000)], benefits (risk stratification, diagnosing coronary artery disease, treatment guidance) and alternatives of a nuclear stress test were discussed in detail with Mr. Antonetti and he agrees to proceed.     Dispo:  Return in about 6 months (around 08/20/2023) for Routine Follow Up with Dr. Raynelle Jan.  Signed, Tereso Newcomer, PA-C

## 2023-02-18 ENCOUNTER — Ambulatory Visit: Payer: Medicare Other | Attending: Physician Assistant | Admitting: Physician Assistant

## 2023-02-18 ENCOUNTER — Encounter: Payer: Self-pay | Admitting: Physician Assistant

## 2023-02-18 VITALS — BP 130/82 | HR 68 | Ht 72.0 in | Wt 207.0 lb

## 2023-02-18 DIAGNOSIS — I2510 Atherosclerotic heart disease of native coronary artery without angina pectoris: Secondary | ICD-10-CM

## 2023-02-18 DIAGNOSIS — I4819 Other persistent atrial fibrillation: Secondary | ICD-10-CM | POA: Diagnosis not present

## 2023-02-18 DIAGNOSIS — E781 Pure hyperglyceridemia: Secondary | ICD-10-CM

## 2023-02-18 DIAGNOSIS — R0602 Shortness of breath: Secondary | ICD-10-CM | POA: Insufficient documentation

## 2023-02-18 DIAGNOSIS — I5032 Chronic diastolic (congestive) heart failure: Secondary | ICD-10-CM

## 2023-02-18 DIAGNOSIS — I10 Essential (primary) hypertension: Secondary | ICD-10-CM

## 2023-02-18 LAB — BASIC METABOLIC PANEL
BUN/Creatinine Ratio: 9 — ABNORMAL LOW (ref 10–24)
Sodium: 140 mmol/L (ref 134–144)

## 2023-02-18 NOTE — Assessment & Plan Note (Signed)
Blood pressure is controlled.  Continue amlodipine 10 mg daily, Imdur 60 mg daily, losartan 25 mg daily, Toprol-XL 50 mg daily

## 2023-02-18 NOTE — Assessment & Plan Note (Signed)
He notes shortness of breath for the past year.  It is not getting any worse.  He describes NYHA II-IIb symptoms.  His ejection fraction improved by most recent limited echo in April 2024.  He does not display signs of congestive heart failure on exam.  He does not describe symptoms consistent with sleep apnea.  He is an ex-smoker.  He quit in 1991.  He does have a history of moderate nonobstructive coronary artery disease.  He also has a history of vasospasm.  Question if his dyspnea could be an anginal equivalent.  I have recommended proceeding with a Lexiscan Myoview to rule out ischemia.  I will also obtain a BMET, BNP.  If his BNP is significantly better, I will adjust his diuretics.  If his stress test is normal and his BNP is normal, consider referral to pulmonology for evaluation of shortness of breath.

## 2023-02-18 NOTE — Assessment & Plan Note (Addendum)
Nonobstructive coronary artery disease in 2006.  As noted, I have recommended a Lexiscan Myoview to rule out ischemia as a cause for his shortness of breath with exertion.  He is not on antiplatelet therapy as he is on Eliquis.  Continue Imdur 60 mg daily, pravastatin 40 mg daily.

## 2023-02-18 NOTE — Assessment & Plan Note (Signed)
LDL in January was above goal at 93.  Continue pravastatin 40 mg daily.  Obtain direct LDL today.  If LDL still above 70, consider increasing pravastatin 80 mg daily.

## 2023-02-18 NOTE — Patient Instructions (Signed)
Medication Instructions:  Your physician recommends that you continue on your current medications as directed. Please refer to the Current Medication list given to you today.  *If you need a refill on your cardiac medications before your next appointment, please call your pharmacy*   Lab Work: TODAY:  BMET, PRO BNP, & DIRECT LDL  If you have labs (blood work) drawn today and your tests are completely normal, you will receive your results only by: MyChart Message (if you have MyChart) OR A paper copy in the mail If you have any lab test that is abnormal or we need to change your treatment, we will call you to review the results.   Testing/Procedures: Your physician has requested that you have a lexiscan myoview. For further information please visit [email or web address] Please follow instruction sheet, BELOW:    You are scheduled for a Myocardial Perfusion Imaging Study  Please arrive 15 minutes prior to your appointment time for registration and insurance purposes.  The test will take approximately 3 to 4 hours to complete; you may bring reading material.  If someone comes with you to your appointment, they will need to remain in the main lobby due to limited space in the testing area. **If you are pregnant or breastfeeding, please notify the nuclear lab prior to your appointment**  How to prepare for your Myocardial Perfusion Test: Do not eat or drink 3 hours prior to your test, except you may have water. Do not consume products containing caffeine (regular or decaffeinated) 12 hours prior to your test. (ex: coffee, chocolate, sodas, tea). Do bring a list of your current medications with you.  If not listed below, you may take your medications as normal. Do wear comfortable clothes (no dresses or overalls) and walking shoes, tennis shoes preferred (No heels or open toe shoes are allowed). Do NOT wear cologne, perfume, aftershave, or lotions (deodorant is allowed). If these instructions  are not followed, your test will have to be rescheduled.     Follow-Up: At [facility], you and your health needs are our priority.  As part of our continuing mission to provide you with exceptional heart care, we have created designated Provider Care Teams.  These Care Teams include your primary Cardiologist (physician) and Advanced Practice Providers (APPs -  Physician Assistants and Nurse Practitioners) who all work together to provide you with the care you need, when you need it.  We recommend signing up for the patient portal called "MyChart".  Sign up information is provided on this After Visit Summary.  MyChart is used to connect with patients for Virtual Visits (Telemedicine).  Patients are able to view lab/test results, encounter notes, upcoming appointments, etc.  Non-urgent messages can be sent to your provider as well.   To learn more about what you can do with MyChart, go to [email or web address].    Your next appointment:   6 month(s)  Provider:   Christell Constant, MD     Other Instructions

## 2023-02-18 NOTE — Assessment & Plan Note (Signed)
Status post recent cardioversion.  He is maintaining sinus rhythm.  He is tolerating anticoagulation.  Labs from the emergency room were reviewed.  His creatinine and hemoglobin were normal.  Continue Eliquis 5 mg twice daily, Toprol-XL 50 mg daily.  As noted, he has an appointment with Dr. Elberta Fortis in July.

## 2023-02-18 NOTE — Assessment & Plan Note (Signed)
EF improved in sinus rhythm and after discontinuation of alcohol.  Continue losartan 25 mg daily, Toprol-XL 50 mg daily, Jardiance 10 mg daily.

## 2023-02-19 ENCOUNTER — Other Ambulatory Visit: Payer: Self-pay

## 2023-02-19 ENCOUNTER — Other Ambulatory Visit: Payer: Self-pay | Admitting: Physician Assistant

## 2023-02-19 DIAGNOSIS — I2510 Atherosclerotic heart disease of native coronary artery without angina pectoris: Secondary | ICD-10-CM

## 2023-02-19 DIAGNOSIS — R0602 Shortness of breath: Secondary | ICD-10-CM

## 2023-02-19 DIAGNOSIS — G8929 Other chronic pain: Secondary | ICD-10-CM

## 2023-02-20 ENCOUNTER — Telehealth: Payer: Self-pay | Admitting: *Deleted

## 2023-02-20 DIAGNOSIS — E781 Pure hyperglyceridemia: Secondary | ICD-10-CM

## 2023-02-20 LAB — PRO B NATRIURETIC PEPTIDE: NT-Pro BNP: 122 pg/mL (ref 0–376)

## 2023-02-20 LAB — BASIC METABOLIC PANEL
BUN: 10 mg/dL (ref 8–27)
CO2: 22 mmol/L (ref 20–29)
Calcium: 10.3 mg/dL — ABNORMAL HIGH (ref 8.6–10.2)
Chloride: 101 mmol/L (ref 96–106)
Creatinine, Ser: 1.13 mg/dL (ref 0.76–1.27)
Glucose: 107 mg/dL — ABNORMAL HIGH (ref 70–99)
Potassium: 4.4 mmol/L (ref 3.5–5.2)
eGFR: 71 mL/min/{1.73_m2} (ref >59)

## 2023-02-20 LAB — LDL CHOLESTEROL, DIRECT: LDL Direct: 97 mg/dL (ref 0–99)

## 2023-02-20 MED ORDER — PRAVASTATIN SODIUM 80 MG PO TABS: 80.0000 mg | ORAL_TABLET | Freq: Every evening | ORAL | 3 refills | Status: DC

## 2023-02-20 NOTE — Progress Notes (Signed)
Pt calling to request Rx be filled today. He is almost out of medicine. He also has other meds to pick up and would like to get this Rx at the same time. Sunday Spillers, CMA

## 2023-02-20 NOTE — Telephone Encounter (Signed)
-----   Message from Beatrice Lecher, PA-C sent at 02/20/2023  9:55 AM EDT ----- Creatinine, K+ normal.  BNP normal.  LDL too high.  Goal is <70. PLAN:  -Increase pravastatin to 80 mg daily -Fasting lipids, LFTs in 3 months Tereso Newcomer, PA-C    02/20/2023 9:54 AM

## 2023-02-21 ENCOUNTER — Ambulatory Visit (INDEPENDENT_AMBULATORY_CARE_PROVIDER_SITE_OTHER): Payer: Medicare Other

## 2023-02-21 VITALS — Ht 72.0 in | Wt 207.0 lb

## 2023-02-21 DIAGNOSIS — Z0000 Encounter for general adult medical examination without abnormal findings: Secondary | ICD-10-CM | POA: Diagnosis not present

## 2023-02-21 MED ORDER — HYDROCODONE-ACETAMINOPHEN 5-325 MG PO TABS
1.0000 | ORAL_TABLET | Freq: Two times a day (BID) | ORAL | 0 refills | Status: DC | PRN
Start: 2023-02-21 — End: 2023-05-30

## 2023-02-21 NOTE — Progress Notes (Addendum)
Subjective:   Corey ADAMOWICZ Sr. is a 67 y.o. male who presents for Medicare Annual/Subsequent preventive examination.  I connected with  Corey Mole Sr. on 02/21/23 by a audio enabled telemedicine application and verified that I am speaking with the correct person using two identifiers.  Patient Location: Home  Provider Location: Home Office  I discussed the limitations of evaluation and management by telemedicine. The patient expressed understanding and agreed to proceed.  Review of Systems     Cardiac Risk Factors include: advanced age (>8men, >33 women);male gender;hypertension;dyslipidemia     Objective:    Today's Vitals   02/21/23 0852  Weight: 207 lb (93.9 kg)  Height: 6' (1.829 m)   Body mass index is 28.07 kg/m.     02/21/2023    9:03 AM 01/22/2023    4:11 PM 12/10/2022    1:37 PM 10/07/2022    8:44 AM 08/30/2022    6:59 AM 08/26/2022    8:28 AM 08/02/2022    8:56 AM  Advanced Directives  Does Patient Have a Medical Advance Directive? No No No No No No No  Would patient like information on creating a medical advance directive? Yes (MAU/Ambulatory/Procedural Areas - Information given) No - Patient declined No - Patient declined   No - Patient declined     Current Medications (verified) Outpatient Encounter Medications as of 02/21/2023  Medication Sig   acetaminophen (TYLENOL) 500 MG tablet Take 500 mg by mouth daily.   allopurinol (ZYLOPRIM) 100 MG tablet TAKE 1 TABLET(100 MG) BY MOUTH DAILY   amLODipine (NORVASC) 10 MG tablet TAKE 1 TABLET(10 MG) BY MOUTH DAILY   apixaban (ELIQUIS) 5 MG TABS tablet TAKE 1 TABLET(5 MG) BY MOUTH TWICE DAILY   busPIRone (BUSPAR) 30 MG tablet TAKE 1 TABLET(30 MG) BY MOUTH TWICE DAILY   empagliflozin (JARDIANCE) 10 MG TABS tablet Take 1 tablet (10 mg total) by mouth daily.   HYDROcodone-acetaminophen (NORCO/VICODIN) 5-325 MG tablet Take 1 tablet by mouth 2 (two) times daily as needed.   isosorbide mononitrate (IMDUR) 60 MG 24  hr tablet TAKE 1 TABLET(60 MG) BY MOUTH DAILY   losartan (COZAAR) 25 MG tablet Take 1 tablet (25 mg total) by mouth daily.   Multiple Vitamin (MULTIVITAMIN WITH MINERALS) TABS tablet Take 1 tablet by mouth daily.   naloxone (NARCAN) nasal spray 4 mg/0.1 mL To be used only in case of suspected opioid overdose   nitroGLYCERIN (NITROSTAT) 0.4 MG SL tablet PLACE 1 TABLET UNDER THE TONGUE IF NEEDED FOR CHEST PAIN EVERY 5 MINUTES FOR 3 DOSES. IF NO RELIEF AFTER 3RD DOSE CALL MD OR 911   omeprazole (PRILOSEC) 20 MG capsule TAKE 1 CAPSULE(20 MG) BY MOUTH DAILY   OVER THE COUNTER MEDICATION Apply 1 Application topically at bedtime as needed (pain). CBD lotion   pravastatin (PRAVACHOL) 80 MG tablet Take 1 tablet (80 mg total) by mouth every evening.   pregabalin (LYRICA) 25 MG capsule TAKE 1 CAPSULE(25 MG) BY MOUTH TWICE DAILY   metoprolol succinate (TOPROL-XL) 50 MG 24 hr tablet Take 1 tablet (50 mg total) by mouth daily at 6 (six) AM.   [DISCONTINUED] HYDROcodone-acetaminophen (NORCO/VICODIN) 5-325 MG tablet Take 1 tablet by mouth 2 (two) times daily as needed.   No facility-administered encounter medications on file as of 02/21/2023.    Allergies (verified) Diphenhydramine hcl and Zantac [ranitidine hcl]   History: Past Medical History:  Diagnosis Date   Acute hearing loss of left ear 03/12/2021   Allergy  Allergic rhinitis   Angina pectoris (HCC) 09/16/2004   Known coronary vasospasm -- treated with combination of long-acting nitrates and calcium channel blockers; NTG when necessary   CAD (coronary artery disease) 04/16/2005   Mild obstructive ramus intermedius disease (30-50%) and 50-60% proximal   Chronic back pain    Stemming from prior lumbar diskectomy   GERD (gastroesophageal reflux disease)    Hyperlipidemia    Hypertension    Obesity    Substance abuse (HCC)    Known chronic alcoholic - not interested in quitting (current as of 2014)   Past Surgical History:  Procedure  Laterality Date   BACK SURGERY  1989, 1997   Chronic back pain stemming from this   CARDIOVASCULAR STRESS TEST  2005   Dr. Verdis Prime -- Cardiolite stress test showed no evidence of ischemia   CARDIOVERSION N/A 08/30/2022   Procedure: CARDIOVERSION;  Surgeon: Lewayne Bunting, MD;  Location: [facility] ENDOSCOPY;  Service: Cardiovascular;  Laterality: N/A;   COLONOSCOPY W/ BIOPSIES  2011   Multiple small polyps and s/p polypectomy -- recommended repeat in 2 years due to multiple small polyps   ESOPHAGEAL DILATION  1997   Family History  Problem Relation Age of Onset   Heart failure Mother    Heart failure Father    Heart disease Father    Social History   Socioeconomic History   Marital status: Married    Spouse name: Corey Watts   Number of children: 2   Years of education: 8   Highest education level: 8th grade  Occupational History   Occupation: Disabled   Tobacco Use   Smoking status: Former    Packs/day: 2    Types: Cigarettes    Passive exposure: Past   Smokeless tobacco: Former    Quit date: 1991   Tobacco comments:    2PPD smoker until 1991  Vaping Use   Vaping Use: Never used  Substance and Sexual Activity   Alcohol use: Yes    Alcohol/week: 3.0 standard drinks of alcohol    Types: 3 Cans of beer per week   Drug use: Not Currently   Sexual activity: Yes  Other Topics Concern   Not on file  Social History Narrative   Patient lives with his wife Corey Watts.    Patient has 2 adult children, with 6 grandchildren, 1 great grandchild.    Patients family lives all on the same street.    Patient enjoys being outside, fixing up his home, and spending time with his family.    Social Determinants of Health   Financial Resource Strain: Low Risk  (02/21/2023)   Overall Financial Resource Strain (CARDIA)    Difficulty of Paying Living Expenses: Not hard at all  Food Insecurity: No Food Insecurity (02/21/2023)   Hunger Vital Sign    Worried About Running Out of Food in the Last  Year: Never true    Ran Out of Food in the Last Year: Never true  Transportation Needs: No Transportation Needs (02/21/2023)   PRAPARE - Administrator, Civil Service (Medical): No    Lack of Transportation (Non-Medical): No  Physical Activity: Insufficiently Active (02/21/2023)   Exercise Vital Sign    Days of Exercise per Week: 3 days    Minutes of Exercise per Session: 30 min  Stress: No Stress Concern Present (02/21/2023)   [employer] of Occupational Health - Occupational Stress Questionnaire    Feeling of Stress : Only a little  Social Connections: Moderately Isolated (  02/21/2023)   Social Connection and Isolation Panel [NHANES]    Frequency of Communication with Friends and Family: More than three times a week    Frequency of Social Gatherings with Friends and Family: Three times a week    Attends Religious Services: Never    Active Member of Clubs or Organizations: No    Attends Banker Meetings: Never    Marital Status: Married    Tobacco Counseling Counseling given: Not Answered Tobacco comments: 2PPD smoker until 1991   Clinical Intake:  Pre-visit preparation completed: Yes  Pain : No/denies pain  Diabetes: No  How often do you need to have someone help you when you read instructions, pamphlets, or other written materials from your doctor or pharmacy?: 1 - Never  Diabetic?No   Interpreter Needed?: No  Information entered by :: Kandis Fantasia LPN   Activities of Daily Living    02/21/2023    9:03 AM  In your present state of health, do you have any difficulty performing the following activities:  Hearing? 0  Vision? 0  Difficulty concentrating or making decisions? 0  Walking or climbing stairs? 0  Dressing or bathing? 0  Doing errands, shopping? 0  Preparing Food and eating ? N  Using the Toilet? N  In the past six months, have you accidently leaked urine? N  Do you have problems with loss of bowel control? N  Managing your  Medications? N  Managing your Finances? N  Housekeeping or managing your Housekeeping? N    Patient Care Team: Alicia Amel, MD as PCP - General (Family Medicine) Christell Constant, MD as PCP - Cardiology (Cardiology)  Indicate any recent Medical Services you may have received from other than [facility] providers in the past year (date may be approximate).     Assessment:   This is a routine wellness examination for [place].  Hearing/Vision screen Hearing Screening - Comments:: Denies hearing difficulties   Vision Screening - Comments:: No vision problems; will schedule routine eye exam soon    Dietary issues and exercise activities discussed: Current Exercise Habits: Home exercise routine, Type of exercise: walking;stretching, Time (Minutes): 30, Frequency (Times/Week): 3, Weekly Exercise (Minutes/Week): 90, Intensity: Mild   Goals Addressed             This Visit's Progress    Remain active and independent         Depression Screen    02/21/2023    8:57 AM 12/10/2022    1:37 PM 10/07/2022    8:44 AM 08/26/2022    8:27 AM 08/02/2022    8:56 AM 07/19/2022    8:34 AM 01/24/2022    8:31 AM  PHQ 2/9 Scores  PHQ - 2 Score 0 0 0   0 0  PHQ- 9 Score  0 0   0 0  Exception Documentation    Patient refusal Patient refusal      Fall Risk    02/21/2023    8:54 AM 12/10/2022    1:37 PM 08/26/2022    8:28 AM 08/02/2022    8:56 AM 01/24/2022    8:31 AM  Fall Risk   Falls in the past year? 0 0 0 0 0  Number falls in past yr: 0 0 0  0  Injury with Fall? 0 0 0  0  Risk for fall due to : No Fall Risks No Fall Risks     Follow up Falls prevention discussed;Education provided;Falls evaluation completed Falls prevention discussed  Falls evaluation completed      FALL RISK PREVENTION PERTAINING TO THE HOME:  Any stairs in or around the home? Yes  If so, are there any without handrails? No  Home free of loose throw rugs in walkways, pet beds, electrical cords, etc? Yes   Adequate lighting in your home to reduce risk of falls? Yes   ASSISTIVE DEVICES UTILIZED TO PREVENT FALLS:  Life alert? No  Use of a cane, walker or w/c? No  Grab bars in the bathroom? Yes  Shower chair or bench in shower? No  Elevated toilet seat or a handicapped toilet? Yes   TIMED UP AND GO:  Was the test performed? No . Telephonic visit   Cognitive Function:        02/21/2023    9:03 AM 11/16/2020    2:12 PM  6CIT Screen  What Year? 0 points 0 points  What month? 0 points 0 points  What time? 0 points 0 points  Count back from 20 0 points 0 points  Months in reverse 0 points 0 points  Repeat phrase 0 points   Total Score 0 points     Immunizations Immunization History  Administered Date(s) Administered   Influenza-Unspecified 06/03/2022   PFIZER(Purple Top)SARS-COV-2 Vaccination 04/02/2020, 04/23/2020   PNEUMOCOCCAL CONJUGATE-20 01/24/2022   Td 10/17/2002   Tdap 06/29/2013    TDAP status: Up to date  Pneumococcal vaccine status: Up to date  Covid-19 vaccine status: Information provided on how to obtain vaccines.   Qualifies for Shingles Vaccine? Yes   Zostavax completed No   Shingrix Completed?: No.    Education has been provided regarding the importance of this vaccine. Patient has been advised to call insurance company to determine out of pocket expense if they have not yet received this vaccine. Advised may also receive vaccine at local pharmacy or Health Dept. Verbalized acceptance and understanding.  Screening Tests Health Maintenance  Topic Date Due   Zoster Vaccines- Shingrix (1 of 2) Never done   COVID-19 Vaccine (3 - Pfizer risk series) 05/21/2020   INFLUENZA VACCINE  04/17/2023   DTaP/Tdap/Td (3 - Td or Tdap) 06/30/2023   Medicare Annual Wellness (AWV)  02/21/2024   Colonoscopy  12/18/2024   Pneumonia Vaccine 47+ Years old  Completed   Hepatitis C Screening  Completed   HPV VACCINES  Aged Out    Health Maintenance  Health Maintenance Due   Topic Date Due   Zoster Vaccines- Shingrix (1 of 2) Never done   COVID-19 Vaccine (3 - Pfizer risk series) 05/21/2020    Colorectal cancer screening: Type of screening: Colonoscopy. Completed 12/19/14. Repeat every 10 years  Lung Cancer Screening: (Low Dose CT Chest recommended if Age 28-80 years, 30 pack-year currently smoking OR have quit w/in 15years.) does not qualify.   Lung Cancer Screening Referral: n/a  Additional Screening:  Hepatitis C Screening: does qualify; Completed 05/23/17  Vision Screening: Recommended annual ophthalmology exams for early detection of glaucoma and other disorders of the eye. Is the patient up to date with their annual eye exam?  No  Who is the provider or what is the name of the office in which the patient attends annual eye exams? none If pt is not established with a provider, would they like to be referred to a provider to establish care? No .   Dental Screening: Recommended annual dental exams for proper oral hygiene  Community Resource Referral / Chronic Care Management: CRR required this visit?  No  CCM required this visit?  No      Plan:     I have personally reviewed and noted the following in the patient's chart:   Medical and social history Use of alcohol, tobacco or illicit drugs  Current medications and supplements including opioid prescriptions. Patient is not currently taking opioid prescriptions. Functional ability and status Nutritional status Physical activity Advanced directives List of other physicians Hospitalizations, surgeries, and ER visits in previous 12 months Vitals Screenings to include cognitive, depression, and falls Referrals and appointments  In addition, I have reviewed and discussed with patient certain preventive protocols, quality metrics, and best practice recommendations. A written personalized care plan for preventive services as well as general preventive health recommendations were provided to  patient.     Durwin Nora, [place]   09/21/1094   Due to this being a virtual visit, the after visit summary with patients personalized plan was offered to patient via mail or my-chart.  per request, patient was mailed a copy of AVS.  Nurse Notes: No concerns     I have reviewed this visit and agree with the documentation.  Marshall L Chambliss

## 2023-02-21 NOTE — Patient Instructions (Addendum)
Corey Watts , Thank you for taking time to come for your Medicare Wellness Visit. I appreciate your ongoing commitment to your health goals. Please review the following plan we discussed and let me know if I can assist you in the future.   These are the goals we discussed:  Goals      Remain active and independent     Weight (lb) < 200 lb (90.7 kg)     Discussed portion control. See AVS for healthy plate tips.         This is a list of the screening recommended for you and due dates:  Health Maintenance  Topic Date Due   Zoster (Shingles) Vaccine (1 of 2) Never done   COVID-19 Vaccine (3 - Pfizer risk series) 05/21/2020   Flu Shot  04/17/2023   DTaP/Tdap/Td vaccine (3 - Td or Tdap) 06/30/2023   Medicare Annual Wellness Visit  02/21/2024   Colon Cancer Screening  12/18/2024   Pneumonia Vaccine  Completed   Hepatitis C Screening  Completed   HPV Vaccine  Aged Out    Advanced directives: Information on Advanced Care Planning can be found at [facility] Advance Health Care Directives Advance Health Care Directives ([email or web address]  Please bring a copy of your health care power of attorney and living will to the office to be added to your chart at your convenience.   Conditions/risks identified: Aim for 30 minutes of exercise or brisk walking, 6-8 glasses of water, and 5 servings of fruits and vegetables each day.   Next appointment: Follow up in one year for your annual wellness visit.   Preventive Care 67 Years and Older, Male  Preventive care refers to lifestyle choices and visits with your health care provider that can promote health and wellness. What does preventive care include? A yearly physical exam. This is also called an annual well check. Dental exams once or twice a year. Routine eye exams. Ask your health care provider how often you should have your eyes checked. Personal lifestyle choices, including: Daily care of your teeth and gums. Regular  physical activity. Eating a healthy diet. Avoiding tobacco and drug use. Limiting alcohol use. Practicing safe sex. Taking low doses of aspirin every day. Taking vitamin and mineral supplements as recommended by your health care provider. What happens during an annual well check? The services and screenings done by your health care provider during your annual well check will depend on your age, overall health, lifestyle risk factors, and family history of disease. Counseling  Your health care provider may ask you questions about your: Alcohol use. Tobacco use. Drug use. Emotional well-being. Home and relationship well-being. Sexual activity. Eating habits. History of falls. Memory and ability to understand (cognition). Work and work Astronomer. Screening  You may have the following tests or measurements: Height, weight, and BMI. Blood pressure. Lipid and cholesterol levels. These may be checked every 5 years, or more frequently if you are over 56 years old. Skin check. Lung cancer screening. You may have this screening every year starting at age 58 if you have a 30-pack-year history of smoking and currently smoke or have quit within the past 15 years. Fecal occult blood test (FOBT) of the stool. You may have this test every year starting at age 32. Flexible sigmoidoscopy or colonoscopy. You may have a sigmoidoscopy every 5 years or a colonoscopy every 10 years starting at age 38. Prostate cancer screening. Recommendations will vary depending on your family history  and other risks. Hepatitis C blood test. Hepatitis B blood test. Sexually transmitted disease (STD) testing. Diabetes screening. This is done by checking your blood sugar (glucose) after you have not eaten for a while (fasting). You may have this done every 1-3 years. Abdominal aortic aneurysm (AAA) screening. You may need this if you are a current or former smoker. Osteoporosis. You may be screened starting at age 90  if you are at high risk. Talk with your health care provider about your test results, treatment options, and if necessary, the need for more tests. Vaccines  Your health care provider may recommend certain vaccines, such as: Influenza vaccine. This is recommended every year. Tetanus, diphtheria, and acellular pertussis (Tdap, Td) vaccine. You may need a Td booster every 10 years. Zoster vaccine. You may need this after age 38. Pneumococcal 13-valent conjugate (PCV13) vaccine. One dose is recommended after age 53. Pneumococcal polysaccharide (PPSV23) vaccine. One dose is recommended after age 41. Talk to your health care provider about which screenings and vaccines you need and how often you need them. This information is not intended to replace advice given to you by your health care provider. Make sure you discuss any questions you have with your health care provider. Document Released: 09/29/2015 Document Revised: 05/22/2016 Document Reviewed: 07/04/2015 Elsevier Interactive Patient Education  2017 [place] Prevention in the Home Falls can cause injuries. They can happen to people of all ages. There are many things you can do to make your home safe and to help prevent falls. What can I do on the outside of my home? Regularly fix the edges of walkways and driveways and fix any cracks. Remove anything that might make you trip as you walk through a door, such as a raised step or threshold. Trim any bushes or trees on the path to your home. Use bright outdoor lighting. Clear any walking paths of anything that might make someone trip, such as rocks or tools. Regularly check to see if handrails are loose or broken. Make sure that both sides of any steps have handrails. Any raised decks and porches should have guardrails on the edges. Have any leaves, snow, or ice cleared regularly. Use sand or salt on walking paths during winter. Clean up any spills in your garage right away. This  includes oil or grease spills. What can I do in the bathroom? Use night lights. Install grab bars by the toilet and in the tub and shower. Do not use towel bars as grab bars. Use non-skid mats or decals in the tub or shower. If you need to sit down in the shower, use a plastic, non-slip stool. Keep the floor dry. Clean up any water that spills on the floor as soon as it happens. Remove soap buildup in the tub or shower regularly. Attach bath mats securely with double-sided non-slip rug tape. Do not have throw rugs and other things on the floor that can make you trip. What can I do in the bedroom? Use night lights. Make sure that you have a light by your bed that is easy to reach. Do not use any sheets or blankets that are too big for your bed. They should not hang down onto the floor. Have a firm chair that has side arms. You can use this for support while you get dressed. Do not have throw rugs and other things on the floor that can make you trip. What can I do in the kitchen? Clean up any spills  right away. Avoid walking on wet floors. Keep items that you use a lot in easy-to-reach places. If you need to reach something above you, use a strong step stool that has a grab bar. Keep electrical cords out of the way. Do not use floor polish or wax that makes floors slippery. If you must use wax, use non-skid floor wax. Do not have throw rugs and other things on the floor that can make you trip. What can I do with my stairs? Do not leave any items on the stairs. Make sure that there are handrails on both sides of the stairs and use them. Fix handrails that are broken or loose. Make sure that handrails are as long as the stairways. Check any carpeting to make sure that it is firmly attached to the stairs. Fix any carpet that is loose or worn. Avoid having throw rugs at the top or bottom of the stairs. If you do have throw rugs, attach them to the floor with carpet tape. Make sure that you  have a light switch at the top of the stairs and the bottom of the stairs. If you do not have them, ask someone to add them for you. What else can I do to help prevent falls? Wear shoes that: Do not have high heels. Have rubber bottoms. Are comfortable and fit you well. Are closed at the toe. Do not wear sandals. If you use a stepladder: Make sure that it is fully opened. Do not climb a closed stepladder. Make sure that both sides of the stepladder are locked into place. Ask someone to hold it for you, if possible. Clearly mark and make sure that you can see: Any grab bars or handrails. First and last steps. Where the edge of each step is. Use tools that help you move around (mobility aids) if they are needed. These include: Canes. Walkers. Scooters. Crutches. Turn on the lights when you go into a dark area. Replace any light bulbs as soon as they burn out. Set up your furniture so you have a clear path. Avoid moving your furniture around. If any of your floors are uneven, fix them. If there are any pets around you, be aware of where they are. Review your medicines with your doctor. Some medicines can make you feel dizzy. This can increase your chance of falling. Ask your doctor what other things that you can do to help prevent falls. This information is not intended to replace advice given to you by your health care provider. Make sure you discuss any questions you have with your health care provider. Document Released: 06/29/2009 Document Revised: 02/08/2016 Document Reviewed: 10/07/2014 Elsevier Interactive Patient Education  2017 [employer].

## 2023-02-26 NOTE — Telephone Encounter (Signed)
Spoke with patient and he has requested to cancel appointment until he can try to get medicaid. He stated he was not able to pay for study at this time.

## 2023-02-28 ENCOUNTER — Ambulatory Visit (HOSPITAL_COMMUNITY): Payer: Medicare Other | Attending: Physician Assistant

## 2023-03-03 ENCOUNTER — Other Ambulatory Visit: Payer: Self-pay | Admitting: Student

## 2023-03-03 DIAGNOSIS — M10061 Idiopathic gout, right knee: Secondary | ICD-10-CM

## 2023-04-11 ENCOUNTER — Ambulatory Visit: Payer: Medicare Other | Admitting: Cardiology

## 2023-05-13 ENCOUNTER — Telehealth: Payer: Self-pay

## 2023-05-13 DIAGNOSIS — M545 Other chronic pain: Secondary | ICD-10-CM

## 2023-05-13 NOTE — Telephone Encounter (Signed)
Corey Watts calls nurse line requesting a refill on Flexeril.   She reports he has not had this filled in a while. However, she reports increased back pain over the last couple of days.   Advised this is not on his medication list, however will send to PCP for advisement.   [facility] [address]

## 2023-05-14 MED ORDER — CYCLOBENZAPRINE HCL 5 MG PO TABS
5.0000 mg | ORAL_TABLET | Freq: Three times a day (TID) | ORAL | 1 refills | Status: AC | PRN
Start: 2023-05-14 — End: ?

## 2023-05-17 ENCOUNTER — Other Ambulatory Visit: Payer: Self-pay | Admitting: Internal Medicine

## 2023-05-20 ENCOUNTER — Other Ambulatory Visit: Payer: Self-pay | Admitting: Student

## 2023-05-20 DIAGNOSIS — M7741 Metatarsalgia, right foot: Secondary | ICD-10-CM

## 2023-05-21 ENCOUNTER — Other Ambulatory Visit: Payer: Self-pay | Admitting: Family Medicine

## 2023-05-23 ENCOUNTER — Ambulatory Visit: Payer: 59 | Attending: Physician Assistant

## 2023-05-23 DIAGNOSIS — E781 Pure hyperglyceridemia: Secondary | ICD-10-CM | POA: Diagnosis not present

## 2023-05-23 LAB — HEPATIC FUNCTION PANEL
ALT: 11 IU/L (ref 0–44)
AST: 15 IU/L (ref 0–40)
Albumin: 4.7 g/dL (ref 3.9–4.9)
Alkaline Phosphatase: 94 IU/L (ref 44–121)
Bilirubin Total: 0.5 mg/dL (ref 0.0–1.2)
Bilirubin, Direct: 0.16 mg/dL (ref 0.00–0.40)
Total Protein: 7.5 g/dL (ref 6.0–8.5)

## 2023-05-23 LAB — LIPID PANEL
Chol/HDL Ratio: 4.4 ratio (ref 0.0–5.0)
Cholesterol, Total: 164 mg/dL (ref 100–199)
HDL: 37 mg/dL — ABNORMAL LOW (ref >39)
LDL Chol Calc (NIH): 91 mg/dL (ref 0–99)
Triglycerides: 212 mg/dL — ABNORMAL HIGH (ref 0–149)
VLDL Cholesterol Cal: 36 mg/dL (ref 5–40)

## 2023-05-26 ENCOUNTER — Telehealth: Payer: Self-pay | Admitting: *Deleted

## 2023-05-26 MED ORDER — EZETIMIBE 10 MG PO TABS: 10.0000 mg | ORAL_TABLET | Freq: Every day | ORAL | 3 refills | Status: DC

## 2023-05-26 NOTE — Telephone Encounter (Signed)
I spoke with patient and reviewed lab results with him.  He is taking Pravastatin daily.  He will add Ezetimibe 10 mg daily and have fasting lab work done when he sees Dr Izora Ribas on 12/2.  Prescription sent to [facility] on [address]

## 2023-05-26 NOTE — Telephone Encounter (Signed)
-----   Message from [place] sent at 05/25/2023  9:31 PM EDT ----- LDL cholesterol above goal. Goal LDL is < 70. Triglycerides are high. Goal is < 150. Liver enzymes (AST, ALT) are normal.  PLAN:  -Confirm pt is taking Pravastatin 80 mg once daily  -If no, start Pravastatin 80 mg once daily  -If yes, add Ezetimibe (Zetia) 10 mg once daily  -Lipids, LFTs in 3 mos.  Tereso Newcomer, PA-C    05/25/2023 9:23 PM

## 2023-05-27 ENCOUNTER — Other Ambulatory Visit: Payer: Self-pay | Admitting: Student

## 2023-05-30 ENCOUNTER — Other Ambulatory Visit: Payer: Self-pay

## 2023-05-30 ENCOUNTER — Encounter: Payer: Self-pay | Admitting: Student

## 2023-05-30 ENCOUNTER — Ambulatory Visit (INDEPENDENT_AMBULATORY_CARE_PROVIDER_SITE_OTHER): Payer: 59 | Admitting: Student

## 2023-05-30 VITALS — BP 136/68 | HR 64 | Ht 72.0 in | Wt 204.4 lb

## 2023-05-30 DIAGNOSIS — M7741 Metatarsalgia, right foot: Secondary | ICD-10-CM | POA: Diagnosis not present

## 2023-05-30 DIAGNOSIS — R7303 Prediabetes: Secondary | ICD-10-CM | POA: Diagnosis not present

## 2023-05-30 DIAGNOSIS — E782 Mixed hyperlipidemia: Secondary | ICD-10-CM | POA: Diagnosis not present

## 2023-05-30 DIAGNOSIS — M545 Low back pain, unspecified: Secondary | ICD-10-CM

## 2023-05-30 DIAGNOSIS — Z23 Encounter for immunization: Secondary | ICD-10-CM | POA: Diagnosis not present

## 2023-05-30 DIAGNOSIS — M7742 Metatarsalgia, left foot: Secondary | ICD-10-CM | POA: Diagnosis not present

## 2023-05-30 DIAGNOSIS — G8929 Other chronic pain: Secondary | ICD-10-CM

## 2023-05-30 MED ORDER — ATORVASTATIN CALCIUM 80 MG PO TABS
80.0000 mg | ORAL_TABLET | Freq: Every day | ORAL | 3 refills | Status: DC
Start: 2023-05-30 — End: 2024-04-30

## 2023-05-30 NOTE — Progress Notes (Unsigned)
    SUBJECTIVE:   CHIEF COMPLAINT / HPI:   Corey Watts Sr. is a 67 y.o. who presents today for an annual exam.   Concern for low B12 He would like [place] to check his B12. Thinks it may be a driver behind his metatarsalgia. We've tried a number of other interventions for his metatarsalgia including gabapentin and metatarsal pads. Tells me that he had low B12 some years ago. Though he was drinking somewhat heavily then and has since quit drinking.   Hyperlipidemia Has been on pravastatin for quite some time. Was told his lipids were still high. He is not sure why he is on pravastatin specifically rather than a higher potency statin.  Health Maintenance Wants his flu shot today. UTD on Colon CA screening. Long discussion about Shingrix. He's not interested at this time.    OBJECTIVE:   BP 136/68   Pulse 64   Ht 6' (1.829 m)   Wt 204 lb 6.4 oz (92.7 kg)   SpO2 95%   BMI 27.72 kg/m   Physical Exam Vitals reviewed.  Constitutional:      General: He is not in acute distress. Cardiovascular:     Rate and Rhythm: Normal rate and regular rhythm.     Heart sounds: No murmur heard. Pulmonary:     Effort: Pulmonary effort is normal. No respiratory distress.     Breath sounds: No wheezing or rales.  Musculoskeletal:        General: No swelling or deformity. Normal range of motion.  Skin:    General: Skin is warm and dry.     Findings: No rash.      ASSESSMENT/PLAN:   Mixed hyperlipidemia Last LDL was 97, he would like his LDL <70, given he has known CAD, I agree with this goal . Not sure why he's not on a high-intensity statin. - Transition from pravastatin 80 to atorvastatin 80mg  daily.  Metatarsalgia - B12 level today - Continue Lyrica 25mg  daily, if B12 is normal and symptoms remain troublesome, can always consider dose increase here as he remains on a relatively low dose     Annual Examination  See AVS for age appropriate recommendations.   Blood pressure value is at  goal, discussed.   Considered the following screening exams based upon USPSTF recommendations: Diabetes screening:  known prediabetes, repeat A1c today  Screening for elevated cholesterol: ordered as above Colorectal cancer screening: up to date on screening for CRC. Lung cancer screening:  not indicated    Follow up in 6 months or sooner if indicated.    Eliezer Mccoy, MD [facility]

## 2023-05-30 NOTE — Patient Instructions (Addendum)
Let's change your pravastatin to atorvastatin. It is a more potent drug in the same class.  I'll call about your B12, I think it's a good thought.

## 2023-05-31 LAB — VITAMIN B12: Vitamin B-12: 552 pg/mL (ref 232–1245)

## 2023-05-31 LAB — HEMOGLOBIN A1C
Est. average glucose Bld gHb Est-mCnc: 131 mg/dL
Hgb A1c MFr Bld: 6.2 % — ABNORMAL HIGH (ref 4.8–5.6)

## 2023-06-01 DIAGNOSIS — E782 Mixed hyperlipidemia: Secondary | ICD-10-CM | POA: Insufficient documentation

## 2023-06-01 NOTE — Assessment & Plan Note (Signed)
-   B12 level today - Continue Lyrica 25mg  daily, if B12 is normal and symptoms remain troublesome, can always consider dose increase here as he remains on a relatively low dose

## 2023-06-01 NOTE — Assessment & Plan Note (Signed)
Last LDL was 97, he would like his LDL <70, given he has known CAD, I agree with this goal . Not sure why he's not on a high-intensity statin. - Transition from pravastatin 80 to atorvastatin 80mg  daily.

## 2023-06-03 ENCOUNTER — Other Ambulatory Visit: Payer: Self-pay | Admitting: Student

## 2023-06-03 ENCOUNTER — Telehealth: Payer: Self-pay

## 2023-06-03 MED ORDER — HYDROCODONE-ACETAMINOPHEN 5-325 MG PO TABS
1.0000 | ORAL_TABLET | Freq: Two times a day (BID) | ORAL | 0 refills | Status: DC | PRN
Start: 2023-06-03 — End: 2023-08-19

## 2023-06-03 NOTE — Telephone Encounter (Signed)
Patient's wife calls nurse line regarding results from visit on 05/30/23.   Requesting returned call at [phone].  Veronda Prude, RN

## 2023-06-13 ENCOUNTER — Ambulatory Visit (INDEPENDENT_AMBULATORY_CARE_PROVIDER_SITE_OTHER): Payer: 59 | Admitting: Podiatry

## 2023-06-13 ENCOUNTER — Encounter: Payer: Self-pay | Admitting: Podiatry

## 2023-06-13 DIAGNOSIS — Z8739 Personal history of other diseases of the musculoskeletal system and connective tissue: Secondary | ICD-10-CM | POA: Diagnosis not present

## 2023-06-13 DIAGNOSIS — R200 Anesthesia of skin: Secondary | ICD-10-CM

## 2023-06-13 DIAGNOSIS — R202 Paresthesia of skin: Secondary | ICD-10-CM

## 2023-06-13 MED ORDER — PREGABALIN 100 MG PO CAPS: 100.0000 mg | ORAL_CAPSULE | Freq: Two times a day (BID) | ORAL | 1 refills | Status: DC

## 2023-06-13 NOTE — Progress Notes (Signed)
Subjective:  Patient ID: Corey Watts., male    DOB: 10-26-1955,  MRN: [id number]  Chief Complaint  Patient presents with   Numbness    Pt stated that his feet stay numb all the time     67 y.o. male presents with the above complaint.  Patient presents with bilateral neuropathy of both feet.  Patient states that he has a history of lower back pain/surgery.  He has been going on for quite some time.  He has not seen anyone as prior to seeing me.  He has tried a lot of treatment for neuropathy he wanted discuss if there were any other options he is currently on Lyrica 25 mg.   Review of Systems: Negative except as noted in the HPI. Denies N/V/F/Ch.  Past Medical History:  Diagnosis Date   Acute hearing loss of left ear 03/12/2021   Allergy    Allergic rhinitis   Angina pectoris (HCC) 09/16/2004   Known coronary vasospasm -- treated with combination of long-acting nitrates and calcium channel blockers; NTG when necessary   CAD (coronary artery disease) 04/16/2005   Mild obstructive ramus intermedius disease (30-50%) and 50-60% proximal   Chronic back pain    Stemming from prior lumbar diskectomy   GERD (gastroesophageal reflux disease)    Hyperlipidemia    Hypertension    Obesity    Substance abuse (HCC)    Known chronic alcoholic - not interested in quitting (current as of 2014)    Current Outpatient Medications:    pregabalin (LYRICA) 100 MG capsule, Take 1 capsule (100 mg total) by mouth 2 (two) times daily., Disp: 60 capsule, Rfl: 1   acetaminophen (TYLENOL) 500 MG tablet, Take 500 mg by mouth daily., Disp: , Rfl:    allopurinol (ZYLOPRIM) 100 MG tablet, TAKE 1 TABLET(100 MG) BY MOUTH DAILY, Disp: 90 tablet, Rfl: 2   amLODipine (NORVASC) 10 MG tablet, TAKE 1 TABLET(10 MG) BY MOUTH DAILY, Disp: 90 tablet, Rfl: 3   apixaban (ELIQUIS) 5 MG TABS tablet, TAKE 1 TABLET(5 MG) BY MOUTH TWICE DAILY, Disp: 60 tablet, Rfl: 5   atorvastatin (LIPITOR) 80 MG tablet, Take 1 tablet (80  mg total) by mouth daily., Disp: 90 tablet, Rfl: 3   busPIRone (BUSPAR) 30 MG tablet, TAKE 1 TABLET(30 MG) BY MOUTH TWICE DAILY, Disp: 180 tablet, Rfl: 3   cyclobenzaprine (FLEXERIL) 5 MG tablet, Take 1 tablet (5 mg total) by mouth 3 (three) times daily as needed for muscle spasms., Disp: 45 tablet, Rfl: 1   empagliflozin (JARDIANCE) 10 MG TABS tablet, Take 1 tablet (10 mg total) by mouth daily., Disp: 90 tablet, Rfl: 3   ezetimibe (ZETIA) 10 MG tablet, Take 1 tablet (10 mg total) by mouth daily., Disp: 90 tablet, Rfl: 3   HYDROcodone-acetaminophen (NORCO/VICODIN) 5-325 MG tablet, Take 1 tablet by mouth 2 (two) times daily as needed., Disp: 60 tablet, Rfl: 0   isosorbide mononitrate (IMDUR) 60 MG 24 hr tablet, TAKE 1 TABLET(60 MG) BY MOUTH DAILY, Disp: 90 tablet, Rfl: 3   losartan (COZAAR) 25 MG tablet, TAKE 1 TABLET(25 MG) BY MOUTH DAILY, Disp: 90 tablet, Rfl: 3   metoprolol succinate (TOPROL-XL) 50 MG 24 hr tablet, Take 1 tablet (50 mg total) by mouth daily at 6 (six) AM., Disp: 30 tablet, Rfl: 2   Multiple Vitamin (MULTIVITAMIN WITH MINERALS) TABS tablet, Take 1 tablet by mouth daily., Disp: , Rfl:    naloxone (NARCAN) nasal spray 4 mg/0.1 mL, To be used only in case  of suspected opioid overdose, Disp: 1 each, Rfl: 0   nitroGLYCERIN (NITROSTAT) 0.4 MG SL tablet, PLACE 1 TABLET UNDER THE TONGUE IF NEEDED FOR CHEST PAIN EVERY 5 MINUTES FOR 3 DOSES. IF NO RELIEF AFTER 3RD DOSE CALL MD OR 911, Disp: 25 tablet, Rfl: 0   omeprazole (PRILOSEC) 20 MG capsule, TAKE 1 CAPSULE(20 MG) BY MOUTH DAILY, Disp: 90 capsule, Rfl: 2   OVER THE COUNTER MEDICATION, Apply 1 Application topically at bedtime as needed (pain). CBD lotion, Disp: , Rfl:    pregabalin (LYRICA) 25 MG capsule, TAKE 1 CAPSULE(25 MG) BY MOUTH TWICE DAILY, Disp: 60 capsule, Rfl: 3  Social History   Tobacco Use  Smoking Status Former   Current packs/day: 2.00   Types: Cigarettes   Passive exposure: Past  Smokeless Tobacco Former   Quit  date: 1991  Tobacco Comments   2PPD smoker until 1991    Allergies  Allergen Reactions   Diphenhydramine Hcl     Facial swelling   Zantac [Ranitidine Hcl]     Hiccups for 3-4 days   Objective:  There were no vitals filed for this visit. There is no height or weight on file to calculate BMI. Constitutional Well developed. Well nourished.  Vascular Dorsalis pedis pulses palpable bilaterally. Posterior tibial pulses palpable bilaterally. Capillary refill normal to all digits.  No cyanosis or clubbing noted. Pedal hair growth normal.  Neurologic Normal speech. Oriented to person, place, and time. Decreased protective sensation noted with Semmes monofilament to all of the digits and forefoot.  Negative tarsal tunnel or common peroneal sinus noted  Dermatologic Nails well groomed and normal in appearance. No open wounds. No skin lesions.  Orthopedic: Manual muscle strength 5 out of 5 bilaterally.   Radiographs: None Assessment:   1. Numbness and tingling   2. History of low back pain    Plan:  Patient was evaluated and treated and all questions answered.  Bilateral numbness tingling with a history of low back pain -All questions and concerns were discussed with the patient in extensive detail.  Given the amount of pain that he is experiencing he will benefit from Lyrica 100 mg given his history of back surgery. -Will increase temporarily to 100 mg to see how he does. -I also discussed with him to follow-up with a back physician to get it evaluated further.  This is primarily a lower back issue and not foot and ankle isolated issue.  No follow-ups on file.

## 2023-07-04 ENCOUNTER — Telehealth: Payer: Self-pay | Admitting: Student

## 2023-07-04 ENCOUNTER — Telehealth: Payer: Self-pay

## 2023-07-04 DIAGNOSIS — M7741 Metatarsalgia, right foot: Secondary | ICD-10-CM

## 2023-07-04 DIAGNOSIS — M545 Low back pain, unspecified: Secondary | ICD-10-CM

## 2023-07-04 NOTE — Telephone Encounter (Signed)
Patient presents with wife for Prolia injection today.   He asks I reach out to PCP and have him call him.  He is requesting a referral to pain clinic. He reports he saw his Podiatrist a few weeks ago for significant pain in his feet. He reports he was given Lyrica, however reports unsatisfaction in therapy. He rpeorts his Podiatrist will just try to increase it.   Advised will forward to PCP.

## 2023-07-04 NOTE — Telephone Encounter (Signed)
Called patient to discuss RN message to Dr. Marisue Humble as I am covering for him this week.  Patient's wife picked up stating that patient recently saw podiatrist for foot pain who increased his Lyrica and said he could consider going to a pain clinic.  Patient's wife states she would prefer if Dr. Marisue Humble were to give the patient a call when he is back from vacation next week.

## 2023-07-07 ENCOUNTER — Other Ambulatory Visit: Payer: Self-pay | Admitting: Student

## 2023-07-07 DIAGNOSIS — I4891 Unspecified atrial fibrillation: Secondary | ICD-10-CM

## 2023-07-07 NOTE — Telephone Encounter (Signed)
Called patient to discuss. He tells me that his podiatrist recommended PM&R evaluation for his chronic metatarsalgia. This is certainly reasonable. Also, he is wondering about the MRI L spine that we ordered several months back. On investigation, it appears this order is still active. Will therefore have him reach out to [facility] re: scheduling this. If they need a new order, he will call back and I would be happy to re-place.  Eliezer Mccoy, MD

## 2023-07-16 ENCOUNTER — Ambulatory Visit (HOSPITAL_COMMUNITY)
Admission: RE | Admit: 2023-07-16 | Discharge: 2023-07-16 | Disposition: A | Discharge status: Home / Self Care | Payer: 59 | Source: Ambulatory Visit | Attending: Physician Assistant | Admitting: Physician Assistant

## 2023-07-16 ENCOUNTER — Encounter: Payer: Self-pay | Admitting: Physical Medicine & Rehabilitation

## 2023-07-16 VITALS — BP 130/76 | HR 106 | Ht 72.0 in | Wt 210.2 lb

## 2023-07-16 DIAGNOSIS — Z79899 Other long term (current) drug therapy: Secondary | ICD-10-CM | POA: Diagnosis not present

## 2023-07-16 DIAGNOSIS — I2510 Atherosclerotic heart disease of native coronary artery without angina pectoris: Secondary | ICD-10-CM | POA: Insufficient documentation

## 2023-07-16 DIAGNOSIS — I4892 Unspecified atrial flutter: Secondary | ICD-10-CM | POA: Insufficient documentation

## 2023-07-16 DIAGNOSIS — I110 Hypertensive heart disease with heart failure: Secondary | ICD-10-CM | POA: Diagnosis not present

## 2023-07-16 DIAGNOSIS — I5020 Unspecified systolic (congestive) heart failure: Secondary | ICD-10-CM | POA: Insufficient documentation

## 2023-07-16 DIAGNOSIS — Z7901 Long term (current) use of anticoagulants: Secondary | ICD-10-CM | POA: Diagnosis not present

## 2023-07-16 DIAGNOSIS — I4819 Other persistent atrial fibrillation: Secondary | ICD-10-CM

## 2023-07-16 DIAGNOSIS — E669 Obesity, unspecified: Secondary | ICD-10-CM | POA: Insufficient documentation

## 2023-07-16 DIAGNOSIS — D6869 Other thrombophilia: Secondary | ICD-10-CM | POA: Diagnosis not present

## 2023-07-16 LAB — BASIC METABOLIC PANEL
Anion gap: 10 (ref 5–15)
BUN: 8 mg/dL (ref 8–23)
CO2: 25 mmol/L (ref 22–32)
Calcium: 9.6 mg/dL (ref 8.9–10.3)
Chloride: 106 mmol/L (ref 98–111)
Creatinine, Ser: 1.18 mg/dL (ref 0.61–1.24)
GFR, Estimated: >60 mL/min (ref >60)
Glucose, Bld: 126 mg/dL — ABNORMAL HIGH (ref 70–99)
Potassium: 4.4 mmol/L (ref 3.5–5.1)
Sodium: 141 mmol/L (ref 135–145)

## 2023-07-16 LAB — CBC
HCT: 46.6 % (ref 39.0–52.0)
Hemoglobin: 15 g/dL (ref 13.0–17.0)
MCH: 31.1 pg (ref 26.0–34.0)
MCHC: 32.2 g/dL (ref 30.0–36.0)
MCV: 96.7 fL (ref 80.0–100.0)
Platelets: 238 10*3/uL (ref 150–400)
RBC: 4.82 MIL/uL (ref 4.22–5.81)
RDW: 14.2 % (ref 11.5–15.5)
WBC: 8.7 10*3/uL (ref 4.0–10.5)
nRBC: 0 % (ref 0.0–0.2)

## 2023-07-16 LAB — TSH: TSH: 2.637 u[IU]/mL (ref 0.350–4.500)

## 2023-07-16 NOTE — Patient Instructions (Signed)
Cardioversion scheduled for: Monday, November 4th   - Arrive at the [facility] and go to admitting at 7am   - Do not eat or drink anything after midnight the night prior to your procedure.   - Take all your morning medication (except diabetic medications) with a sip of water prior to arrival.  - You will not be able to drive home after your procedure.    - Do NOT miss any doses of your blood thinner - if you should miss a dose please notify our office immediately.   - If you feel as if you go back into normal rhythm prior to scheduled cardioversion, please notify our office immediately.   If your procedure is canceled in the cardioversion suite you will be charged a cancellation fee.      Hold below medications 72 hours prior to scheduled procedure/anesthesia. Restart medication on the following day after scheduled procedure/anesthesia  Empagliflozin (Jardiance)       For those patients who have a scheduled procedure/anesthesia on the same day of the week as their dose, hold the medication on the day of surgery.  They can take their scheduled dose the week before.  **Patients on the above medications scheduled for elective procedures that have not held the medication for the appropriate amount of time are at risk of cancellation or change in the anesthetic plan.

## 2023-07-16 NOTE — H&P (View-Only) (Signed)
 Primary Care Physician: Alicia Amel, MD Referring Physician: Dr. Haze Justin Sr. is a 67 y.o. male with a h/o moderate non obstructive CAD in 2006 complicated by coronary vasospasm, alcohol use, new onset  atrial fibrillation, and obesity, as well as new heart failure with reduced EF.  He was seen by Dr. Ronette Deter 08/16/22 with c/o of exertional dyspnea and was in afib with RVR. He had a cardioversion 12/15 which was successful. He is now in the afib clinic for f/u . EKG today shows SR. He states that he feels improved but still at times feels short of breath. EF by echo performed while  in afib with RVR was 30-35%. He has given up alcohol and lost 17 lbs over the last 2 weeks. Since giving up alcohol and weight loss, his wife states that he is no longer snoring or having apnea spells. He is complaint with anticoagulation. Minimal caffeine and no tobacco.   On follow up 01/22/23, he is in what appears to be atrial flutter with RVR 171 bpm. He admits to being in this abnormal rhythm since Monday. He is presyncopal and almost passed out earlier today. He can feel his heart beating rapidly and is short of breath with walking. Denies chest pain. He has not missed any doses of Eliquis 5 mg BID. The last time he has eaten today was around noon.   He notes he has not had any alcohol since December.   On follow up 01/30/23, he is in NSR. He is s/p ED evaluation after our office visit on 01/22/23. He underwent emergent cardioversion in the ED and was successfully converted to NSR. He notes he was very short of breath when out of rhythm and this has improved. He is still short of breath but it is better. He has not missed any doses of anticoagulation.  Follow up in the AF clinic 07/16/23. Patient reports that he went into afib on Monday and has been feeling much more fatigued and SOB. There were no specific triggers that he could identify. He had to cancel his consultation with EP to  discuss ablation due to an insurance change.  Today, he denies symptoms of chest pain, orthopnea, PND, lower extremity edema, dizziness, presyncope, syncope, or neurologic sequela. The patient is tolerating medications without difficulties and is otherwise without complaint today.   Past Medical History:  Diagnosis Date   Acute hearing loss of left ear 03/12/2021   Allergy    Allergic rhinitis   Angina pectoris (HCC) 09/16/2004   Known coronary vasospasm -- treated with combination of long-acting nitrates and calcium channel blockers; NTG when necessary   CAD (coronary artery disease) 04/16/2005   Mild obstructive ramus intermedius disease (30-50%) and 50-60% proximal   Chronic back pain    Stemming from prior lumbar diskectomy   GERD (gastroesophageal reflux disease)    Hyperlipidemia    Hypertension    Obesity    Substance abuse (HCC)    Known chronic alcoholic - not interested in quitting (current as of 2014)    Current Outpatient Medications  Medication Sig Dispense Refill   acetaminophen (TYLENOL) 500 MG tablet Take 500 mg by mouth daily.     allopurinol (ZYLOPRIM) 100 MG tablet TAKE 1 TABLET(100 MG) BY MOUTH DAILY 90 tablet 2   amLODipine (NORVASC) 10 MG tablet TAKE 1 TABLET(10 MG) BY MOUTH DAILY 90 tablet 3   apixaban (ELIQUIS) 5 MG TABS tablet TAKE 1 TABLET(5 MG) BY MOUTH  TWICE DAILY 180 tablet 3   atorvastatin (LIPITOR) 80 MG tablet Take 1 tablet (80 mg total) by mouth daily. 90 tablet 3   busPIRone (BUSPAR) 30 MG tablet TAKE 1 TABLET(30 MG) BY MOUTH TWICE DAILY 180 tablet 3   cyclobenzaprine (FLEXERIL) 5 MG tablet Take 1 tablet (5 mg total) by mouth 3 (three) times daily as needed for muscle spasms. 45 tablet 1   empagliflozin (JARDIANCE) 10 MG TABS tablet Take 1 tablet (10 mg total) by mouth daily. 90 tablet 3   ezetimibe (ZETIA) 10 MG tablet Take 1 tablet (10 mg total) by mouth daily. 90 tablet 3   HYDROcodone-acetaminophen (NORCO/VICODIN) 5-325 MG tablet Take 1 tablet  by mouth 2 (two) times daily as needed. 60 tablet 0   isosorbide mononitrate (IMDUR) 60 MG 24 hr tablet TAKE 1 TABLET(60 MG) BY MOUTH DAILY 90 tablet 3   losartan (COZAAR) 25 MG tablet TAKE 1 TABLET(25 MG) BY MOUTH DAILY 90 tablet 3   metoprolol succinate (TOPROL-XL) 50 MG 24 hr tablet Take 1 tablet (50 mg total) by mouth daily at 6 (six) AM. 30 tablet 2   Multiple Vitamin (MULTIVITAMIN WITH MINERALS) TABS tablet Take 1 tablet by mouth daily.     naloxone (NARCAN) nasal spray 4 mg/0.1 mL To be used only in case of suspected opioid overdose 1 each 0   nitroGLYCERIN (NITROSTAT) 0.4 MG SL tablet PLACE 1 TABLET UNDER THE TONGUE IF NEEDED FOR CHEST PAIN EVERY 5 MINUTES FOR 3 DOSES. IF NO RELIEF AFTER 3RD DOSE CALL MD OR 911 25 tablet 0   omeprazole (PRILOSEC) 20 MG capsule TAKE 1 CAPSULE(20 MG) BY MOUTH DAILY 90 capsule 2   OVER THE COUNTER MEDICATION Apply 1 Application topically at bedtime as needed (pain). CBD lotion     pregabalin (LYRICA) 100 MG capsule Take 1 capsule (100 mg total) by mouth 2 (two) times daily. 60 capsule 1   No current facility-administered medications for this encounter.    ROS- All systems are reviewed and negative except as per the HPI above  Physical Exam: Vitals:   07/16/23 0951  BP: 130/76  Pulse: (!) 106  SpO2: 94%  Weight: 95.3 kg  Height: 6' (1.829 m)    Wt Readings from Last 3 Encounters:  07/16/23 95.3 kg  05/30/23 92.7 kg  02/21/23 93.9 kg    GEN: Well nourished, well developed in no acute distress NECK: No JVD; No carotid bruits CARDIAC: Irregularly irregular rate and rhythm, no murmurs, rubs, gallops RESPIRATORY:  Clear to auscultation without rales, wheezing or rhonchi  ABDOMEN: Soft, non-tender, non-distended EXTREMITIES:  No edema; No deformity     EKG today demonstrates Afib Vent. rate 106 BPM PR interval * ms QRS duration 100 ms QT/QTcB 354/470 ms   ECHO 01/03/23: IMPRESSIONS   1. Left ventricular ejection fraction by 3D volume  is 62 %. The left  ventricle has no regional wall motion abnormalities.   2. Mild mitral valve regurgitation.   3. The inferior vena cava is normal in size with greater than 50%  respiratory variability, suggesting right atrial pressure of 3 mmHg.   Comparison(s): Prior images reviewed side by side. The left ventricular  function has improved.    CHA2DS2-VASc Score = 4  The patient's score is based upon: CHF History: 1 HTN History: 1 Diabetes History: 0 Stroke History: 0 Vascular Disease History: 1 Age Score: 1 Gender Score: 0       ASSESSMENT AND PLAN: Persistent Atrial Fibrillation/atrial flutter The  patient's CHA2DS2-VASc score is 4, indicating a 4.8% annual risk of stroke.   Patient back in symptomatic afib. We discussed rhythm control options today. Will arrange for DCCV. Patient now able to have consultation with EP to discuss ablation, will refer again.  Check cbc/bmet/TSH today Continue Eliquis 5 mg BID Continue Toprol 50 mg daily  Secondary Hypercoagulable State (ICD10:  D68.69) The patient is at significant risk for stroke/thromboembolism based upon his CHA2DS2-VASc Score of 4.  Continue Apixaban (Eliquis).   HTN Stable on current regimen  CAD Nonobstructive CAD 2006 Lexiscan ordered previously but not completed  HFimpEF EF improved with SR and discontinuation of alcohol GDMT per primary cardiology team    Follow up in the AF clinic post DCCV and then with EP to discuss ablation.    Jorja Loa PA-C [facility] [address] [place], [place] [address] [phone]

## 2023-07-16 NOTE — Progress Notes (Signed)
Primary Care Physician: Alicia Amel, MD Referring Physician: Dr. Haze Justin Sr. is a 67 y.o. male with a h/o moderate non obstructive CAD in 2006 complicated by coronary vasospasm, alcohol use, new onset  atrial fibrillation, and obesity, as well as new heart failure with reduced EF.  He was seen by Dr. Ronette Deter 08/16/22 with c/o of exertional dyspnea and was in afib with RVR. He had a cardioversion 12/15 which was successful. He is now in the afib clinic for f/u . EKG today shows SR. He states that he feels improved but still at times feels short of breath. EF by echo performed while  in afib with RVR was 30-35%. He has given up alcohol and lost 17 lbs over the last 2 weeks. Since giving up alcohol and weight loss, his wife states that he is no longer snoring or having apnea spells. He is complaint with anticoagulation. Minimal caffeine and no tobacco.   On follow up 01/22/23, he is in what appears to be atrial flutter with RVR 171 bpm. He admits to being in this abnormal rhythm since Monday. He is presyncopal and almost passed out earlier today. He can feel his heart beating rapidly and is short of breath with walking. Denies chest pain. He has not missed any doses of Eliquis 5 mg BID. The last time he has eaten today was around noon.   He notes he has not had any alcohol since December.   On follow up 01/30/23, he is in NSR. He is s/p ED evaluation after our office visit on 01/22/23. He underwent emergent cardioversion in the ED and was successfully converted to NSR. He notes he was very short of breath when out of rhythm and this has improved. He is still short of breath but it is better. He has not missed any doses of anticoagulation.  Follow up in the AF clinic 07/16/23. Patient reports that he went into afib on Monday and has been feeling much more fatigued and SOB. There were no specific triggers that he could identify. He had to cancel his consultation with EP to  discuss ablation due to an insurance change.  Today, he denies symptoms of chest pain, orthopnea, PND, lower extremity edema, dizziness, presyncope, syncope, or neurologic sequela. The patient is tolerating medications without difficulties and is otherwise without complaint today.   Past Medical History:  Diagnosis Date   Acute hearing loss of left ear 03/12/2021   Allergy    Allergic rhinitis   Angina pectoris (HCC) 09/16/2004   Known coronary vasospasm -- treated with combination of long-acting nitrates and calcium channel blockers; NTG when necessary   CAD (coronary artery disease) 04/16/2005   Mild obstructive ramus intermedius disease (30-50%) and 50-60% proximal   Chronic back pain    Stemming from prior lumbar diskectomy   GERD (gastroesophageal reflux disease)    Hyperlipidemia    Hypertension    Obesity    Substance abuse (HCC)    Known chronic alcoholic - not interested in quitting (current as of 2014)    Current Outpatient Medications  Medication Sig Dispense Refill   acetaminophen (TYLENOL) 500 MG tablet Take 500 mg by mouth daily.     allopurinol (ZYLOPRIM) 100 MG tablet TAKE 1 TABLET(100 MG) BY MOUTH DAILY 90 tablet 2   amLODipine (NORVASC) 10 MG tablet TAKE 1 TABLET(10 MG) BY MOUTH DAILY 90 tablet 3   apixaban (ELIQUIS) 5 MG TABS tablet TAKE 1 TABLET(5 MG) BY MOUTH  TWICE DAILY 180 tablet 3   atorvastatin (LIPITOR) 80 MG tablet Take 1 tablet (80 mg total) by mouth daily. 90 tablet 3   busPIRone (BUSPAR) 30 MG tablet TAKE 1 TABLET(30 MG) BY MOUTH TWICE DAILY 180 tablet 3   cyclobenzaprine (FLEXERIL) 5 MG tablet Take 1 tablet (5 mg total) by mouth 3 (three) times daily as needed for muscle spasms. 45 tablet 1   empagliflozin (JARDIANCE) 10 MG TABS tablet Take 1 tablet (10 mg total) by mouth daily. 90 tablet 3   ezetimibe (ZETIA) 10 MG tablet Take 1 tablet (10 mg total) by mouth daily. 90 tablet 3   HYDROcodone-acetaminophen (NORCO/VICODIN) 5-325 MG tablet Take 1 tablet  by mouth 2 (two) times daily as needed. 60 tablet 0   isosorbide mononitrate (IMDUR) 60 MG 24 hr tablet TAKE 1 TABLET(60 MG) BY MOUTH DAILY 90 tablet 3   losartan (COZAAR) 25 MG tablet TAKE 1 TABLET(25 MG) BY MOUTH DAILY 90 tablet 3   metoprolol succinate (TOPROL-XL) 50 MG 24 hr tablet Take 1 tablet (50 mg total) by mouth daily at 6 (six) AM. 30 tablet 2   Multiple Vitamin (MULTIVITAMIN WITH MINERALS) TABS tablet Take 1 tablet by mouth daily.     naloxone (NARCAN) nasal spray 4 mg/0.1 mL To be used only in case of suspected opioid overdose 1 each 0   nitroGLYCERIN (NITROSTAT) 0.4 MG SL tablet PLACE 1 TABLET UNDER THE TONGUE IF NEEDED FOR CHEST PAIN EVERY 5 MINUTES FOR 3 DOSES. IF NO RELIEF AFTER 3RD DOSE CALL MD OR 911 25 tablet 0   omeprazole (PRILOSEC) 20 MG capsule TAKE 1 CAPSULE(20 MG) BY MOUTH DAILY 90 capsule 2   OVER THE COUNTER MEDICATION Apply 1 Application topically at bedtime as needed (pain). CBD lotion     pregabalin (LYRICA) 100 MG capsule Take 1 capsule (100 mg total) by mouth 2 (two) times daily. 60 capsule 1   No current facility-administered medications for this encounter.    ROS- All systems are reviewed and negative except as per the HPI above  Physical Exam: Vitals:   07/16/23 0951  BP: 130/76  Pulse: (!) 106  SpO2: 94%  Weight: 95.3 kg  Height: 6' (1.829 m)    Wt Readings from Last 3 Encounters:  07/16/23 95.3 kg  05/30/23 92.7 kg  02/21/23 93.9 kg    GEN: Well nourished, well developed in no acute distress NECK: No JVD; No carotid bruits CARDIAC: Irregularly irregular rate and rhythm, no murmurs, rubs, gallops RESPIRATORY:  Clear to auscultation without rales, wheezing or rhonchi  ABDOMEN: Soft, non-tender, non-distended EXTREMITIES:  No edema; No deformity     EKG today demonstrates Afib Vent. rate 106 BPM PR interval * ms QRS duration 100 ms QT/QTcB 354/470 ms   ECHO 01/03/23: IMPRESSIONS   1. Left ventricular ejection fraction by 3D volume  is 62 %. The left  ventricle has no regional wall motion abnormalities.   2. Mild mitral valve regurgitation.   3. The inferior vena cava is normal in size with greater than 50%  respiratory variability, suggesting right atrial pressure of 3 mmHg.   Comparison(s): Prior images reviewed side by side. The left ventricular  function has improved.    CHA2DS2-VASc Score = 4  The patient's score is based upon: CHF History: 1 HTN History: 1 Diabetes History: 0 Stroke History: 0 Vascular Disease History: 1 Age Score: 1 Gender Score: 0       ASSESSMENT AND PLAN: Persistent Atrial Fibrillation/atrial flutter The  patient's CHA2DS2-VASc score is 4, indicating a 4.8% annual risk of stroke.   Patient back in symptomatic afib. We discussed rhythm control options today. Will arrange for DCCV. Patient now able to have consultation with EP to discuss ablation, will refer again.  Check cbc/bmet/TSH today Continue Eliquis 5 mg BID Continue Toprol 50 mg daily  Secondary Hypercoagulable State (ICD10:  D68.69) The patient is at significant risk for stroke/thromboembolism based upon his CHA2DS2-VASc Score of 4.  Continue Apixaban (Eliquis).   HTN Stable on current regimen  CAD Nonobstructive CAD 2006 Lexiscan ordered previously but not completed  HFimpEF EF improved with SR and discontinuation of alcohol GDMT per primary cardiology team    Follow up in the AF clinic post DCCV and then with EP to discuss ablation.    Jorja Loa PA-C [facility] [address] [place], [place] [address] [phone]

## 2023-07-17 ENCOUNTER — Ambulatory Visit (HOSPITAL_COMMUNITY): Payer: 59 | Admitting: Physician Assistant

## 2023-07-18 NOTE — Progress Notes (Signed)
Spoke to patient and instructed them to come at 06:45  and to be NPO after 0000. Medications reviewed.    Confirmed that patient will have a ride home and someone to stay with them for 24 hours after the procedure.

## 2023-07-20 NOTE — Anesthesia Preprocedure Evaluation (Addendum)
Anesthesia Evaluation  Patient identified by MRN, date of birth, ID band Patient awake    Reviewed: Allergy & Precautions, H&P , NPO status , Patient's Chart, lab work & pertinent test results  Airway Mallampati: II   Neck ROM: full    Dental  (+) Upper Dentures, Lower Dentures   Pulmonary former smoker   breath sounds clear to auscultation       Cardiovascular hypertension, + angina  + CAD  + dysrhythmias Atrial Fibrillation  Rhythm:irregular Rate:Normal  ECHO 24' 1. Left ventricular ejection fraction by 3D volume is 62 %. The left  ventricle has no regional wall motion abnormalities.   2. Mild mitral valve regurgitation.   3. The inferior vena cava is normal in size with greater than 50%  respiratory variability, suggesting right atrial pressure of 3 mmHg.   Comparison(s): Prior images reviewed side by side. The left ventricular  function has improved.   TTE (08/12/22): EF 35%   Neuro/Psych  Neuromuscular disease    GI/Hepatic ,GERD  ,,(+)     substance abuse  alcohol use  Endo/Other    Renal/GU      Musculoskeletal   Abdominal   Peds  Hematology   Anesthesia Other Findings   Reproductive/Obstetrics                             Anesthesia Physical Anesthesia Plan  ASA: 3  Anesthesia Plan: General   Post-op Pain Management:    Induction: Intravenous  PONV Risk Score and Plan: 2 and Propofol infusion and Treatment may vary due to age or medical condition  Airway Management Planned: Mask  Additional Equipment: None  Intra-op Plan:   Post-operative Plan:   Informed Consent: I have reviewed the patients History and Physical, chart, labs and discussed the procedure including the risks, benefits and alternatives for the proposed anesthesia with the patient or authorized representative who has indicated his/her understanding and acceptance.     Dental advisory  given  Plan Discussed with: CRNA, Anesthesiologist and Surgeon  Anesthesia Plan Comments:        Anesthesia Quick Evaluation

## 2023-07-21 ENCOUNTER — Ambulatory Visit (HOSPITAL_COMMUNITY): Payer: 59 | Admitting: Anesthesiology

## 2023-07-21 ENCOUNTER — Other Ambulatory Visit: Payer: Self-pay

## 2023-07-21 ENCOUNTER — Ambulatory Visit (HOSPITAL_COMMUNITY)
Admission: RE | Admit: 2023-07-21 | Discharge: 2023-07-21 | Disposition: A | Discharge status: Home / Self Care | Payer: 59 | Attending: Cardiology | Admitting: Cardiology

## 2023-07-21 ENCOUNTER — Encounter (HOSPITAL_COMMUNITY)
Admission: RE | Disposition: A | Discharge status: Home / Self Care | Payer: Self-pay | Source: Home / Self Care | Attending: Cardiology

## 2023-07-21 ENCOUNTER — Encounter (HOSPITAL_COMMUNITY): Payer: Self-pay | Admitting: Cardiology

## 2023-07-21 DIAGNOSIS — I110 Hypertensive heart disease with heart failure: Secondary | ICD-10-CM | POA: Diagnosis not present

## 2023-07-21 DIAGNOSIS — I4892 Unspecified atrial flutter: Secondary | ICD-10-CM | POA: Diagnosis not present

## 2023-07-21 DIAGNOSIS — I4819 Other persistent atrial fibrillation: Secondary | ICD-10-CM | POA: Diagnosis not present

## 2023-07-21 DIAGNOSIS — I5022 Chronic systolic (congestive) heart failure: Secondary | ICD-10-CM | POA: Insufficient documentation

## 2023-07-21 DIAGNOSIS — E669 Obesity, unspecified: Secondary | ICD-10-CM | POA: Insufficient documentation

## 2023-07-21 DIAGNOSIS — Z7901 Long term (current) use of anticoagulants: Secondary | ICD-10-CM | POA: Diagnosis not present

## 2023-07-21 DIAGNOSIS — I4891 Unspecified atrial fibrillation: Secondary | ICD-10-CM

## 2023-07-21 DIAGNOSIS — I10 Essential (primary) hypertension: Secondary | ICD-10-CM | POA: Diagnosis not present

## 2023-07-21 DIAGNOSIS — D6869 Other thrombophilia: Secondary | ICD-10-CM | POA: Insufficient documentation

## 2023-07-21 DIAGNOSIS — Z79899 Other long term (current) drug therapy: Secondary | ICD-10-CM | POA: Diagnosis not present

## 2023-07-21 DIAGNOSIS — I2510 Atherosclerotic heart disease of native coronary artery without angina pectoris: Secondary | ICD-10-CM | POA: Diagnosis not present

## 2023-07-21 DIAGNOSIS — Z6828 Body mass index (BMI) 28.0-28.9, adult: Secondary | ICD-10-CM | POA: Insufficient documentation

## 2023-07-21 DIAGNOSIS — I25119 Atherosclerotic heart disease of native coronary artery with unspecified angina pectoris: Secondary | ICD-10-CM | POA: Diagnosis not present

## 2023-07-21 HISTORY — PX: CARDIOVERSION: EP1203

## 2023-07-21 LAB — GLUCOSE, CAPILLARY: Glucose-Capillary: 101 mg/dL — ABNORMAL HIGH (ref 70–99)

## 2023-07-21 SURGERY — CARDIOVERSION (CATH LAB)
Anesthesia: General

## 2023-07-21 MED ORDER — PROPOFOL 10 MG/ML IV BOLUS
INTRAVENOUS | Status: DC | PRN
  Administered 2023-07-21: 60 mg via INTRAVENOUS

## 2023-07-21 SURGICAL SUPPLY — 1 items: PAD DEFIB RADIO PHYSIO CONN (PAD) ×1 IMPLANT

## 2023-07-21 NOTE — Transfer of Care (Signed)
Immediate Anesthesia Transfer of Care Note  Patient: Corey KAU Sr.  Procedure(s) Performed: CARDIOVERSION (CATH LAB)  Patient Location: PACU  Anesthesia Type:General  Level of Consciousness: drowsy  Airway & Oxygen Therapy: Patient Spontanous Breathing and Patient connected to face mask oxygen  Post-op Assessment: Report given to RN and Post -op Vital signs reviewed and stable  Post vital signs: Reviewed and stable  Last Vitals:  Vitals Value Taken Time  BP    Temp 36.5 C 07/21/23 0747  Pulse    Resp    SpO2      Last Pain:  Vitals:   07/21/23 0747  TempSrc: Temporal  PainSc: Asleep         Complications: No notable events documented.

## 2023-07-21 NOTE — Interval H&P Note (Signed)
History and Physical Interval Note:  07/21/2023 7:58 AM  Corey Mole Sr.  has presented today for surgery, with the diagnosis of AFIB.  The various methods of treatment have been discussed with the patient and family. After consideration of risks, benefits and other options for treatment, the patient has consented to  Procedure(s): CARDIOVERSION (CATH LAB) (N/A) as a surgical intervention.  The patient's history has been reviewed, patient examined, no change in status, stable for surgery.  I have reviewed the patient's chart and labs.  Questions were answered to the patient's satisfaction.     Little Ishikawa

## 2023-07-21 NOTE — CV Procedure (Addendum)
Procedure:   DCCV  Indication:  Symptomatic atrial fibrillation  Procedure Note:  The patient signed informed consent.  They have had had therapeutic anticoagulation with Eliquis greater than 3 weeks.  Anesthesia was administered by Dr. Tacy Dura.  Adequate airway was maintained throughout and vital followed per protocol.  They were cardioverted x 1 with 200J of biphasic synchronized energy.  They converted to NSR with rate 70-80s.  There were no apparent complications.  The patient had normal neuro status and respiratory status post procedure with vitals stable as recorded elsewhere.    Follow up:  They will continue on current medical therapy and follow up with cardiology as scheduled.  Epifanio Lesches, MD 07/21/2023 7:59 AM

## 2023-07-21 NOTE — Anesthesia Postprocedure Evaluation (Signed)
Anesthesia Post Note  Patient: Corey SANTONE Sr.  Procedure(s) Performed: CARDIOVERSION (CATH LAB)     Patient location during evaluation: PACU Anesthesia Type: General Level of consciousness: awake and alert Pain management: pain level controlled Vital Signs Assessment: post-procedure vital signs reviewed and stable Respiratory status: spontaneous breathing, nonlabored ventilation, respiratory function stable and patient connected to nasal cannula oxygen Cardiovascular status: blood pressure returned to baseline and stable Postop Assessment: no apparent nausea or vomiting Anesthetic complications: no   No notable events documented.  Last Vitals:  Vitals:   07/21/23 0834 07/21/23 0835  BP:  108/79  Pulse:  70  Resp:  16  Temp: 36.7 C   SpO2:  97%    Last Pain:  Vitals:   07/21/23 0834  TempSrc: Temporal  PainSc:                  Sherryn Pollino

## 2023-07-29 ENCOUNTER — Encounter: Payer: 59 | Admitting: Physical Medicine & Rehabilitation

## 2023-07-29 ENCOUNTER — Ambulatory Visit (HOSPITAL_COMMUNITY)
Admission: RE | Admit: 2023-07-29 | Discharge: 2023-07-29 | Disposition: A | Discharge status: Home / Self Care | Payer: 59 | Source: Ambulatory Visit | Attending: Internal Medicine | Admitting: Internal Medicine

## 2023-07-29 VITALS — BP 120/60 | HR 53 | Ht 72.0 in | Wt 205.8 lb

## 2023-07-29 DIAGNOSIS — I110 Hypertensive heart disease with heart failure: Secondary | ICD-10-CM | POA: Diagnosis not present

## 2023-07-29 DIAGNOSIS — I4891 Unspecified atrial fibrillation: Secondary | ICD-10-CM

## 2023-07-29 DIAGNOSIS — Z79899 Other long term (current) drug therapy: Secondary | ICD-10-CM | POA: Diagnosis not present

## 2023-07-29 DIAGNOSIS — R9431 Abnormal electrocardiogram [ECG] [EKG]: Secondary | ICD-10-CM | POA: Diagnosis not present

## 2023-07-29 DIAGNOSIS — I4892 Unspecified atrial flutter: Secondary | ICD-10-CM | POA: Insufficient documentation

## 2023-07-29 DIAGNOSIS — Z7901 Long term (current) use of anticoagulants: Secondary | ICD-10-CM | POA: Diagnosis not present

## 2023-07-29 DIAGNOSIS — I5022 Chronic systolic (congestive) heart failure: Secondary | ICD-10-CM | POA: Diagnosis not present

## 2023-07-29 DIAGNOSIS — I25111 Atherosclerotic heart disease of native coronary artery with angina pectoris with documented spasm: Secondary | ICD-10-CM | POA: Insufficient documentation

## 2023-07-29 DIAGNOSIS — D6869 Other thrombophilia: Secondary | ICD-10-CM | POA: Diagnosis not present

## 2023-07-29 DIAGNOSIS — I4819 Other persistent atrial fibrillation: Secondary | ICD-10-CM | POA: Insufficient documentation

## 2023-07-29 NOTE — Progress Notes (Signed)
Primary Care Physician: Alicia Amel, MD Referring Physician: Dr. Haze Corey Sr. is a 67 y.o. male with a h/o moderate non obstructive CAD in 2006 complicated by coronary vasospasm, alcohol use, new onset  atrial fibrillation, and obesity, as well as new heart failure with reduced EF.  He was seen by Corey Watts 08/16/22 with c/o of exertional dyspnea and was in afib with RVR. He had a cardioversion 12/15 which was successful. He is now in the afib clinic for f/u . EKG today shows SR. He states that he feels improved but still at times feels short of breath. EF by echo performed while  in afib with RVR was 30-35%. He has given up alcohol and lost 17 lbs over the last 2 weeks. Since giving up alcohol and weight loss, his wife states that he is no longer snoring or having apnea spells. He is complaint with anticoagulation. Minimal caffeine and no tobacco.   On follow up 01/22/23, he is in what appears to be atrial flutter with RVR 171 bpm. He admits to being in this abnormal rhythm since Monday. He is presyncopal and almost passed out earlier today. He can feel his heart beating rapidly and is short of breath with walking. Denies chest pain. He has not missed any doses of Eliquis 5 mg BID. The last time he has eaten today was around noon.   He notes he has not had any alcohol since December.   On follow up 01/30/23, he is in NSR. He is s/p ED evaluation after our office visit on 01/22/23. He underwent emergent cardioversion in the ED and was successfully converted to NSR. He notes he was very short of breath when out of rhythm and this has improved. He is still short of breath but it is better. He has not missed any doses of anticoagulation.  Follow up in the AF clinic 07/16/23. Patient reports that he went into afib on Monday and has been feeling much more fatigued and SOB. There were no specific triggers that he could identify. He had to cancel his consultation with EP to  discuss ablation due to an insurance change.  Follow up in Afib clinic, 07/29/23. He is currently in NSR. S/p successful DCCV on 07/21/23. He feels better in normal rhythm. No missed doses of Eliquis. He would still like to discuss ablation with EP.   Today, he denies symptoms of chest pain, orthopnea, PND, lower extremity edema, dizziness, presyncope, syncope, or neurologic sequela. The patient is tolerating medications without difficulties and is otherwise without complaint today.   Past Medical History:  Diagnosis Date   Acute hearing loss of left ear 03/12/2021   Allergy    Allergic rhinitis   Angina pectoris (HCC) 09/16/2004   Known coronary vasospasm -- treated with combination of long-acting nitrates and calcium channel blockers; NTG when necessary   CAD (coronary artery disease) 04/16/2005   Mild obstructive ramus intermedius disease (30-50%) and 50-60% proximal   Chronic back pain    Stemming from prior lumbar diskectomy   GERD (gastroesophageal reflux disease)    Hyperlipidemia    Hypertension    Obesity    Substance abuse (HCC)    Known chronic alcoholic - not interested in quitting (current as of 2014)    Current Outpatient Medications  Medication Sig Dispense Refill   acetaminophen (TYLENOL) 500 MG tablet Take 500 mg by mouth daily.     allopurinol (ZYLOPRIM) 100 MG tablet TAKE 1 TABLET(100  MG) BY MOUTH DAILY 90 tablet 2   amLODipine (NORVASC) 10 MG tablet TAKE 1 TABLET(10 MG) BY MOUTH DAILY 90 tablet 3   apixaban (ELIQUIS) 5 MG TABS tablet TAKE 1 TABLET(5 MG) BY MOUTH TWICE DAILY 180 tablet 3   atorvastatin (LIPITOR) 80 MG tablet Take 1 tablet (80 mg total) by mouth daily. 90 tablet 3   busPIRone (BUSPAR) 30 MG tablet TAKE 1 TABLET(30 MG) BY MOUTH TWICE DAILY 180 tablet 3   cyclobenzaprine (FLEXERIL) 5 MG tablet Take 1 tablet (5 mg total) by mouth 3 (three) times daily as needed for muscle spasms. 45 tablet 1   empagliflozin (JARDIANCE) 10 MG TABS tablet Take 1 tablet  (10 mg total) by mouth daily. 90 tablet 3   ezetimibe (ZETIA) 10 MG tablet Take 1 tablet (10 mg total) by mouth daily. 90 tablet 3   HYDROcodone-acetaminophen (NORCO/VICODIN) 5-325 MG tablet Take 1 tablet by mouth 2 (two) times daily as needed. 60 tablet 0   isosorbide mononitrate (IMDUR) 60 MG 24 hr tablet TAKE 1 TABLET(60 MG) BY MOUTH DAILY 90 tablet 3   losartan (COZAAR) 25 MG tablet TAKE 1 TABLET(25 MG) BY MOUTH DAILY 90 tablet 3   metoprolol succinate (TOPROL-XL) 50 MG 24 hr tablet Take 1 tablet (50 mg total) by mouth daily at 6 (six) AM. 30 tablet 2   Multiple Vitamin (MULTIVITAMIN WITH MINERALS) TABS tablet Take 1 tablet by mouth daily.     naloxone (NARCAN) nasal spray 4 mg/0.1 mL To be used only in case of suspected opioid overdose 1 each 0   nitroGLYCERIN (NITROSTAT) 0.4 MG SL tablet PLACE 1 TABLET UNDER THE TONGUE IF NEEDED FOR CHEST PAIN EVERY 5 MINUTES FOR 3 DOSES. IF NO RELIEF AFTER 3RD DOSE CALL MD OR 911 25 tablet 0   omeprazole (PRILOSEC) 20 MG capsule TAKE 1 CAPSULE(20 MG) BY MOUTH DAILY 90 capsule 2   OVER THE COUNTER MEDICATION Apply 1 Application topically at bedtime as needed (pain). CBD lotion     pregabalin (LYRICA) 100 MG capsule Take 1 capsule (100 mg total) by mouth 2 (two) times daily. 60 capsule 1   No current facility-administered medications for this encounter.    ROS- All systems are reviewed and negative except as per the HPI above  Physical Exam: Vitals:   07/29/23 0826  Height: 6' (1.829 m)    Wt Readings from Last 3 Encounters:  07/21/23 95.3 kg  07/16/23 95.3 kg  05/30/23 92.7 kg   GEN- The patient is well appearing, alert and oriented x 3 today.   Neck - no JVD or carotid bruit noted Lungs- Clear to ausculation bilaterally, normal work of breathing Heart- Regular bradycardic rate and rhythm, no murmurs, rubs or gallops, PMI not laterally displaced Extremities- no clubbing, cyanosis, or edema Skin - no rash or ecchymosis noted   EKG today  demonstrates Vent. rate 53 BPM PR interval 108 ms QRS duration 114 ms QT/QTcB 436/409 ms P-R-T axes 105 56 50 Sinus bradycardia with short PR Nonspecific ST and T wave abnormality Abnormal ECG When compared with ECG of 21-Jul-2023 08:01, PREVIOUS ECG IS PRESENT   ECHO 01/03/23: IMPRESSIONS   1. Left ventricular ejection fraction by 3D volume is 62 %. The left  ventricle has no regional wall motion abnormalities.   2. Mild mitral valve regurgitation.   3. The inferior vena cava is normal in size with greater than 50%  respiratory variability, suggesting right atrial pressure of 3 mmHg.   Comparison(s): Prior  images reviewed side by side. The left ventricular  function has improved.    CHA2DS2-VASc Score = 4  The patient's score is based upon: CHF History: 1 HTN History: 1 Diabetes History: 0 Stroke History: 0 Vascular Disease History: 1 Age Score: 1 Gender Score: 0      ASSESSMENT AND PLAN: Persistent Atrial Fibrillation/atrial flutter The patient's CHA2DS2-VASc score is 4, indicating a 4.8% annual risk of stroke.    S/p successful DCCV on 07/21/23.  He is currently in NSR.  Continue Eliquis 5 mg BID. Continue Toprol 50 mg daily.  Secondary Hypercoagulable State (ICD10:  D68.69) The patient is at significant risk for stroke/thromboembolism based upon his CHA2DS2-VASc Score of 4.  Continue Apixaban (Eliquis).   HTN Stable on current regimen  CAD Nonobstructive CAD 2006 Lexiscan ordered previously but not completed  HFimpEF EF improved with SR and discontinuation of alcohol GDMT per primary cardiology team    Follow up with EP to discuss ablation.    Corey Mend, PA-C Afib Clinic [facility] [address] [place], [place] [address] [phone]

## 2023-08-08 ENCOUNTER — Encounter: Payer: 59 | Attending: Physical Medicine & Rehabilitation | Admitting: Physical Medicine & Rehabilitation

## 2023-08-08 ENCOUNTER — Encounter: Payer: Self-pay | Admitting: Physical Medicine & Rehabilitation

## 2023-08-08 VITALS — BP 106/66 | HR 66 | Ht 72.0 in | Wt 205.0 lb

## 2023-08-08 DIAGNOSIS — G629 Polyneuropathy, unspecified: Secondary | ICD-10-CM | POA: Diagnosis not present

## 2023-08-08 DIAGNOSIS — M5442 Lumbago with sciatica, left side: Secondary | ICD-10-CM | POA: Diagnosis not present

## 2023-08-08 DIAGNOSIS — F1011 Alcohol abuse, in remission: Secondary | ICD-10-CM

## 2023-08-08 DIAGNOSIS — R7303 Prediabetes: Secondary | ICD-10-CM | POA: Diagnosis not present

## 2023-08-08 DIAGNOSIS — E538 Deficiency of other specified B group vitamins: Secondary | ICD-10-CM | POA: Diagnosis not present

## 2023-08-08 DIAGNOSIS — G8929 Other chronic pain: Secondary | ICD-10-CM

## 2023-08-08 MED ORDER — PREGABALIN 150 MG PO CAPS: 150.0000 mg | ORAL_CAPSULE | Freq: Two times a day (BID) | ORAL | 2 refills | Status: DC

## 2023-08-08 NOTE — Progress Notes (Addendum)
 Subjective:    Patient ID: Corey DELENA Claudene Chrystal., male    DOB: 10-19-1955, 67 y.o.   MRN: [id number]  HPI    HPI 08/08/2023  Corey VOSSLER Sr. is a 67 y.o. year old male  who  has a past medical history of Acute hearing loss of left ear (03/12/2021), Allergy, Angina pectoris (HCC) (09/16/2004), CAD (coronary artery disease) (04/16/2005), Chronic back pain, GERD (gastroesophageal reflux disease), Hyperlipidemia, Hypertension, Obesity, and Substance abuse (HCC).   They are presenting to PM&R clinic as a new patient for pain management evaluation. They were referred by Dr. Jeanelle  for treatment of metatarsalgia pain.  Patient reports that he has had pain in his bilateral feet for about 2 years.  He reports it is a pins and needle sensation in his plantar feet and a burning sensation in his dorsal feet. Pain is constant and worsening over time.  Ambulation is painful.   He had 2 lower back surgeries in 1989 and 1991.  Patient reports he is getting hydrocodone  for his back pain and this is keeping it under control.  Left sciatica occasionally.    He reports this is ordered by PCP and that he plans to continue to follow-up with PCP for this medication.  He has a history of excessive alcohol use but has discontinued use of this substance.  He also has a history of B12 deficiency although B12 is now up to 552 on 05/30/2023.  He also has prediabetes.  He is using Lyrica  100 mg twice daily.  Reports this is helping but not keeping the pain under control. Patient was seen by podiatry, his Lyrica  was increased to 100 mg at that time.   Red flag symptoms: No red flags for back pain endorsed in Hx or ROS  Medications tried: Topical medications - CBD cream for feet Nsaids - cannot due to anticoagulation/afib Tylenol   - Doesn't help much  Opiates  Taking norco 5mg   Lyrica - 100mg  BID, helps a little, doesn't cause sedation Gabapentin - didn't help much  TCAs  Amitriptyline  for depression- didn't help  depression, he used this before he had the feet pain  SNRIs  - denies   Other treatments: PT/OT  - Multiple times for lower back, years ago TENs unit -  didn't help  Injections - ESI? For back pain,didn't help Cortisone in feet by podiatry didn't help  Surgery -  denies   Prior UDS results:     Component Value Date/Time   LABOPIA PPS 10/17/2014 0840   COCAINSCRNUR NEG 10/17/2014 0840   LABBENZ NEG 10/17/2014 0840   AMPHETMU NEG 10/17/2014 0840      Pain Inventory Average Pain 10 Pain Right Now 8 My pain is constant, sharp, burning, stabbing, tingling, and aching  In the last 24 hours, has pain interfered with the following? General activity 0 Relation with others 0 Enjoyment of life 0 What TIME of day is your pain at its worst? morning , daytime, evening, and night Sleep (in general) Fair  Pain is worse with: walking and standing Pain improves with: rest and medication Relief from Meds: 5  how many minutes can you walk? A few ability to climb steps?  yes do you drive?  yes Do you have any goals in this area?  yes  retired  weakness numbness tingling trouble walking anxiety  Any changes since last visit?  no New Patient  Any changes since last visit?  no    Family History  Problem Relation Age  of Onset   Heart failure Mother    Heart failure Father    Heart disease Father    Social History   Socioeconomic History   Marital status: Married    Spouse name: Corey Watts   Number of children: 2   Years of education: 8   Highest education level: 8th grade  Occupational History   Occupation: Disabled   Tobacco Use   Smoking status: Former    Current packs/day: 2.00    Types: Cigarettes    Passive exposure: Past   Smokeless tobacco: Former    Quit date: 1991   Tobacco comments:    2PPD smoker until 1991  Vaping Use   Vaping status: Never Used  Substance and Sexual Activity   Alcohol use: Yes    Alcohol/week: 3.0 standard drinks of alcohol     Types: 3 Cans of beer per week   Drug use: Not Currently   Sexual activity: Yes  Other Topics Concern   Not on file  Social History Narrative   Patient lives with his wife Corey Watts.    Patient has 2 adult children, with 6 grandchildren, 1 great grandchild.    Patients family lives all on the same street.    Patient enjoys being outside, fixing up his home, and spending time with his family.    Social Determinants of Health   Financial Resource Strain: Low Risk  (02/21/2023)   Overall Financial Resource Strain (CARDIA)    Difficulty of Paying Living Expenses: Not hard at all  Food Insecurity: No Food Insecurity (02/21/2023)   Hunger Vital Sign    Worried About Running Out of Food in the Last Year: Never true    Ran Out of Food in the Last Year: Never true  Transportation Needs: No Transportation Needs (02/21/2023)   PRAPARE - Administrator, Civil Service (Medical): No    Lack of Transportation (Non-Medical): No  Physical Activity: Insufficiently Active (02/21/2023)   Exercise Vital Sign    Days of Exercise per Week: 3 days    Minutes of Exercise per Session: 30 min  Stress: No Stress Concern Present (02/21/2023)   [employer] of Occupational Health - Occupational Stress Questionnaire    Feeling of Stress : Only a little  Social Connections: Moderately Isolated (02/21/2023)   Social Connection and Isolation Panel [NHANES]    Frequency of Communication with Friends and Family: More than three times a week    Frequency of Social Gatherings with Friends and Family: Three times a week    Attends Religious Services: Never    Active Member of Clubs or Organizations: No    Attends Banker Meetings: Never    Marital Status: Married   Past Surgical History:  Procedure Laterality Date   BACK SURGERY  1989, 1997   Chronic back pain stemming from this   CARDIOVASCULAR STRESS TEST  2005   Dr. Victory Sharps -- Cardiolite stress test showed no evidence of ischemia    CARDIOVERSION N/A 08/30/2022   Procedure: CARDIOVERSION;  Surgeon: Pietro Redell RAMAN, MD;  Location: [facility] ENDOSCOPY;  Service: Cardiovascular;  Laterality: N/A;   CARDIOVERSION N/A 07/21/2023   Procedure: CARDIOVERSION (CATH LAB);  Surgeon: Kate Lonni CROME, MD;  Location: [facility] INVASIVE CV LAB;  Service: Cardiovascular;  Laterality: N/A;   COLONOSCOPY W/ BIOPSIES  2011   Multiple small polyps and s/p polypectomy -- recommended repeat in 2 years due to multiple small polyps   ESOPHAGEAL DILATION  1997   Past  Medical History:  Diagnosis Date   Acute hearing loss of left ear 03/12/2021   Allergy    Allergic rhinitis   Angina pectoris (HCC) 09/16/2004   Known coronary vasospasm -- treated with combination of long-acting nitrates and calcium  channel blockers; NTG when necessary   CAD (coronary artery disease) 04/16/2005   Mild obstructive ramus intermedius disease (30-50%) and 50-60% proximal   Chronic back pain    Stemming from prior lumbar diskectomy   GERD (gastroesophageal reflux disease)    Hyperlipidemia    Hypertension    Obesity    Substance abuse (HCC)    Known chronic alcoholic - not interested in quitting (current as of 2014)   There were no vitals taken for this visit.  Opioid Risk Score:   Fall Risk Score:  `1  Depression screen [facility] 2/9     02/21/2023    8:57 AM 12/10/2022    1:37 PM 10/07/2022    8:44 AM 07/19/2022    8:34 AM 01/24/2022    8:31 AM 02/09/2021    9:11 AM 11/16/2020    2:10 PM  Depression screen PHQ 2/9  Decreased Interest 0 0 0 0 0 0 0  Down, Depressed, Hopeless 0 0 0 0 0 0 0  PHQ - 2 Score 0 0 0 0 0 0 0  Altered sleeping  0 0 0 0 0   Tired, decreased energy  0 0 0 0 0   Change in appetite  0 0 0 0 0   Feeling bad or failure about yourself   0 0 0 0 0   Trouble concentrating  0 0 0 0 0   Moving slowly or fidgety/restless  0 0 0 0 0   Suicidal thoughts  0 0 0 0 0   PHQ-9 Score  0 0 0 0 0   Difficult doing work/chores   Not difficult at all         Review of Systems  Musculoskeletal:  Positive for gait problem.  Neurological:  Positive for tremors, weakness and numbness.       Tingling  Psychiatric/Behavioral:         Anxiety  All other systems reviewed and are negative.      Objective:   Physical Exam   Gen: no distress, normal appearing HEENT: oral mucosa pink and moist, NCAT Chest: normal effort, normal rate of breathing Abd: soft, non-distended Ext: no edema Psych: pleasant, normal affect Skin: intact Neuro: Alert and awake, follows commands, cranial nerves II through XII grossly intact, normal speech and language RUE: 5/5 Deltoid, 5/5 Biceps, 5/5 Triceps, 5/5 Wrist Ext, 5/5 Grip LUE: 5/5 Deltoid, 5/5 Biceps, 5/5 Triceps, 5/5 Wrist Ext, 5/5 Grip RLE: HF 5/5, KE 5/5, ADF 5/5, APF 5/5 LLE: HF 5/5, KE 5/5, ADF 5/5, APF 5/5 Altered sensation bilateral feet but the ankles and below No abnormal tone noted Musculoskeletal:  SLR positive on the left Slump test positive on the left Pain with lumbar spinal extension Facet loading negative  B12 deficiency history    SLR pos on Left L spine tender Pain with L spine extension Slump L positive Facet loading negative           Assessment & Plan:   1) Polyneuropathy likely due to combination of for diabetes, history of B12 deficiency and history of frequent alcohol use 2) Chronic midline low back pain with left-sided sciatica 3) Hx of Vitamin B12 deficiency 4) History of alcohol abuse.  Congratulated on cessation. 5) Prediabetes  6) history of depression reports this is improved.  Denies SI or HI   Plan -Increase Lyrica  to 150 mg twice daily --Discussed Qutenza as an option for neuropathic pain control. Discussed that this is a capsaicin patch, stronger than capsaicin cream. Discussed that it is currently approved for diabetic peripheral neuropathy and post-herpetic neuralgia, but that it has also shown benefit in treating other forms of neuropathy. Provided  patient with link to site to learn more about the patch: https://www.clark.biz/. Discussed that the patch would be placed in office and benefits usually last 3 months. Discussed that unintended exposure to capsaicin can cause severe irritation of eyes, mucous membranes, respiratory tract, and skin, but that Qutenza is a local treatment and does not have the systemic side effects of other nerve medications. Discussed that there may be pain, itching, erythema, and decreased sensory function associated with the application of Qutenza. Side effects usually subside within 1 week. A cold pack of analgesic medications can help with these side effects. Blood pressure can also be increased due to pain associated with administration of the patch. -Hold off on Elavil  due to A-fib history.  He says he is getting a cardioversion soon. -Consider duloxetine at later time -Patient reports his back pain is controlled with   05/10/24  needs f/u for continued refills

## 2023-08-08 NOTE — Progress Notes (Signed)
   Subjective:    Patient ID: Corey Watts., male    DOB: 11-Feb-1956, 67 y.o.   MRN: [id number]  HPI    Review of Systems     Objective:   Physical Exam        Assessment & Plan:

## 2023-08-18 ENCOUNTER — Ambulatory Visit: Payer: Medicare Other | Admitting: Internal Medicine

## 2023-08-18 ENCOUNTER — Encounter: Payer: Self-pay | Admitting: Internal Medicine

## 2023-08-18 ENCOUNTER — Ambulatory Visit: Payer: 59 | Attending: Internal Medicine | Admitting: Internal Medicine

## 2023-08-18 VITALS — BP 102/58 | HR 55 | Ht 72.0 in | Wt 205.2 lb

## 2023-08-18 DIAGNOSIS — I2510 Atherosclerotic heart disease of native coronary artery without angina pectoris: Secondary | ICD-10-CM | POA: Diagnosis not present

## 2023-08-18 DIAGNOSIS — I5032 Chronic diastolic (congestive) heart failure: Secondary | ICD-10-CM | POA: Diagnosis not present

## 2023-08-18 DIAGNOSIS — I4891 Unspecified atrial fibrillation: Secondary | ICD-10-CM | POA: Diagnosis not present

## 2023-08-18 DIAGNOSIS — D6869 Other thrombophilia: Secondary | ICD-10-CM | POA: Diagnosis not present

## 2023-08-18 DIAGNOSIS — I480 Paroxysmal atrial fibrillation: Secondary | ICD-10-CM | POA: Diagnosis not present

## 2023-08-18 MED ORDER — EZETIMIBE 10 MG PO TABS: 10.0000 mg | ORAL_TABLET | Freq: Every day | ORAL | 3 refills | Status: DC

## 2023-08-18 MED ORDER — ISOSORBIDE MONONITRATE ER 60 MG PO TB24: 90.0000 mg | ORAL_TABLET | Freq: Every day | ORAL | 3 refills | Status: DC

## 2023-08-18 NOTE — Patient Instructions (Signed)
Medication Instructions:  Your physician has recommended you make the following change in your medication:  INCREASE: isosorbide mononitrate (Imdur) to 90 mg by mouth once daily.    RESTART: ezetimibe (Zetia) 10 mg by mouth once daily   *If you need a refill on your cardiac medications before your next appointment, please call your pharmacy*   Lab Work: IN 3 MONTHS: fasting lipid panel and alt (nothing to eat or drink 12 hours before except water) If you have labs (blood work) drawn today and your tests are completely normal, you will receive your results only by: MyChart Message (if you have MyChart) OR A paper copy in the mail If you have any lab test that is abnormal or we need to change your treatment, we will call you to review the results.   Testing/Procedures: NONE   Follow-Up: At [facility], you and your health needs are our priority.  As part of our continuing mission to provide you with exceptional heart care, we have created designated Provider Care Teams.  These Care Teams include your primary Cardiologist (physician) and Advanced Practice Providers (APPs -  Physician Assistants and Nurse Practitioners) who all work together to provide you with the care you need, when you need it.     Your next appointment:   5 month(s)  Provider:   Christell Constant, MD  or Tereso Newcomer, PA-C      Other Instructions Please monitor your BP as discussed with Dr. Izora Ribas.  Contact our office if you have consistent low BP readings.

## 2023-08-18 NOTE — Progress Notes (Signed)
Cardiology Office Note:    Date:  08/18/2023   ID:  Corey Mole Sr., DOB 12/08/1955, MRN [id number]  PCP:  Alicia Amel, MD   [facility] Providers Cardiologist:  Christell Constant, MD     Referring MD: Alicia Amel, MD   CC: AF and HF f/u  History of Present Illness:    Corey Illes. is a 67 y.o. male with a hx of Moderate non obstructive CAD in 2006 complicated by coronary vasospasm.  Alcohol use, atrial fibrillation, and morbid obesity. New Heart Failure with reduced EF.  2023 Successful DCCV, Lost 17 lbs after stopping alcohol; still having SOB at AF clinic eval. 2024: Halloween AF s/p DCCV  Patient notes that he is doing ok.   Since last visit notes notes that he had a repeat DCCV.  Has had residual chest soreness EKG showed sinus bradycardia with normal QTc  No chest pain or pressure.  Notes SOB/DOE but no PND/Orthopnea.  No weight gain or leg swelling.  No palpitations since his last DCCV. He notes a 48 pack year history but has stopped smoking greater than 10 years ago. One year sober    Past Medical History:  Diagnosis Date   Acute hearing loss of left ear 03/12/2021   Allergy    Allergic rhinitis   Angina pectoris (HCC) 09/16/2004   Known coronary vasospasm -- treated with combination of long-acting nitrates and calcium channel blockers; NTG when necessary   CAD (coronary artery disease) 04/16/2005   Mild obstructive ramus intermedius disease (30-50%) and 50-60% proximal   Chronic back pain    Stemming from prior lumbar diskectomy   GERD (gastroesophageal reflux disease)    Hyperlipidemia    Hypertension    Obesity    Substance abuse (HCC)    Known chronic alcoholic - not interested in quitting (current as of 2014)    Past Surgical History:  Procedure Laterality Date   BACK SURGERY  1989, 1997   Chronic back pain stemming from this   CARDIOVASCULAR STRESS TEST  2005   Dr. Verdis Prime -- Cardiolite stress test showed no  evidence of ischemia   CARDIOVERSION N/A 08/30/2022   Procedure: CARDIOVERSION;  Surgeon: Lewayne Bunting, MD;  [facility]COPY;  Service: Cardiovascular;  Laterality: N/A;   CARDIOVERSION N/A 07/21/2023   Procedure: CARDIOVERSION (CATH LAB);  Surgeon: Little Ishikawa, MD;  Location: [facility] INVASIVE CV LAB;  Service: Cardiovascular;  Laterality: N/A;   COLONOSCOPY W/ BIOPSIES  2011   Multiple small polyps and s/p polypectomy -- recommended repeat in 2 years due to multiple small polyps   ESOPHAGEAL DILATION  1997    Current Medications: Current Meds  Medication Sig   acetaminophen (TYLENOL) 500 MG tablet Take 500 mg by mouth daily.   allopurinol (ZYLOPRIM) 100 MG tablet TAKE 1 TABLET(100 MG) BY MOUTH DAILY   amLODipine (NORVASC) 10 MG tablet TAKE 1 TABLET(10 MG) BY MOUTH DAILY   apixaban (ELIQUIS) 5 MG TABS tablet TAKE 1 TABLET(5 MG) BY MOUTH TWICE DAILY   atorvastatin (LIPITOR) 80 MG tablet Take 1 tablet (80 mg total) by mouth daily.   busPIRone (BUSPAR) 30 MG tablet TAKE 1 TABLET(30 MG) BY MOUTH TWICE DAILY   cyclobenzaprine (FLEXERIL) 5 MG tablet Take 1 tablet (5 mg total) by mouth 3 (three) times daily as needed for muscle spasms.   empagliflozin (JARDIANCE) 10 MG TABS tablet Take 1 tablet (10 mg total) by mouth daily.   HYDROcodone-acetaminophen (NORCO/VICODIN)  5-325 MG tablet Take 1 tablet by mouth 2 (two) times daily as needed.   isosorbide mononitrate (IMDUR) 60 MG 24 hr tablet Take 1.5 tablets (90 mg total) by mouth daily.   losartan (COZAAR) 25 MG tablet TAKE 1 TABLET(25 MG) BY MOUTH DAILY   metoprolol succinate (TOPROL-XL) 50 MG 24 hr tablet Take 1 tablet (50 mg total) by mouth daily at 6 (six) AM.   Multiple Vitamin (MULTIVITAMIN WITH MINERALS) TABS tablet Take 1 tablet by mouth daily.   naloxone (NARCAN) nasal spray 4 mg/0.1 mL To be used only in case of suspected opioid overdose   nitroGLYCERIN (NITROSTAT) 0.4 MG SL tablet PLACE 1 TABLET UNDER THE TONGUE IF NEEDED  FOR CHEST PAIN EVERY 5 MINUTES FOR 3 DOSES. IF NO RELIEF AFTER 3RD DOSE CALL MD OR 911   omeprazole (PRILOSEC) 20 MG capsule TAKE 1 CAPSULE(20 MG) BY MOUTH DAILY   OVER THE COUNTER MEDICATION Apply 1 Application topically at bedtime as needed (pain). CBD lotion   pregabalin (LYRICA) 150 MG capsule Take 1 capsule (150 mg total) by mouth 2 (two) times daily.   [DISCONTINUED] isosorbide mononitrate (IMDUR) 60 MG 24 hr tablet TAKE 1 TABLET(60 MG) BY MOUTH DAILY     Allergies:   Diphenhydramine hcl and Zantac [ranitidine hcl]   Social History   Socioeconomic History   Marital status: Married    Spouse name: Eber Jones   Number of children: 2   Years of education: 8   Highest education level: 8th grade  Occupational History   Occupation: Disabled   Tobacco Use   Smoking status: Former    Current packs/day: 2.00    Types: Cigarettes    Passive exposure: Past   Smokeless tobacco: Former    Quit date: 1991   Tobacco comments:    2PPD smoker until 1991  Vaping Use   Vaping status: Never Used  Substance and Sexual Activity   Alcohol use: Not Currently    Alcohol/week: 3.0 standard drinks of alcohol    Types: 3 Cans of beer per week   Drug use: Not Currently   Sexual activity: Yes  Other Topics Concern   Not on file  Social History Narrative   Patient lives with his wife Eber Jones.    Patient has 2 adult children, with 6 grandchildren, 1 great grandchild.    Patients family lives all on the same street.    Patient enjoys being outside, fixing up his home, and spending time with his family.    Social Determinants of Health   Financial Resource Strain: Low Risk  (02/21/2023)   Overall Financial Resource Strain (CARDIA)    Difficulty of Paying Living Expenses: Not hard at all  Food Insecurity: No Food Insecurity (02/21/2023)   Hunger Vital Sign    Worried About Running Out of Food in the Last Year: Never true    Ran Out of Food in the Last Year: Never true  Transportation Needs: No  Transportation Needs (02/21/2023)   PRAPARE - Administrator, Civil Service (Medical): No    Lack of Transportation (Non-Medical): No  Physical Activity: Insufficiently Active (02/21/2023)   Exercise Vital Sign    Days of Exercise per Week: 3 days    Minutes of Exercise per Session: 30 min  Stress: No Stress Concern Present (02/21/2023)   [employer] of Occupational Health - Occupational Stress Questionnaire    Feeling of Stress : Only a little  Social Connections: Moderately Isolated (02/21/2023)   Social Connection  and Isolation Panel [NHANES]    Frequency of Communication with Friends and Family: More than three times a week    Frequency of Social Gatherings with Friends and Family: Three times a week    Attends Religious Services: Never    Active Member of Clubs or Organizations: No    Attends Banker Meetings: Never    Marital Status: Married    Social: Married on his 18th birthday to wife  Family History: The patient's family history includes Heart disease in his father; Heart failure in his father and mother.  ROS:   Please see the history of present illness.     All other systems reviewed and are negative.  EKGs/Labs/Other Studies Reviewed:    The following studies were reviewed today:  EKG:   12/06/22: sinus bradycardia with anterior T wave 08/16/22: Afib RVR  Cardiac Studies & Procedures       ECHOCARDIOGRAM  ECHOCARDIOGRAM LIMITED 01/03/2023  Narrative ECHOCARDIOGRAM LIMITED REPORT    Patient Name:   Corey RAJENDRAN Sr. Date of Exam: 01/03/2023 Medical Rec #:  [id number]           Height:       72.0 in Accession #:    [id number]          Weight:       213.0 lb Date of Birth:  1955/10/20           BSA:          2.188 m Patient Age:    67 years            BP:           136/80 mmHg Patient Gender: M                   HR:           57 bpm. Exam Location:  [address]  Procedure: Limited Echo, 3D Echo, Cardiac Doppler and Limited Color  Doppler  Indications:    I50.20 Heart Failure with Reduced EF  History:        Patient has prior history of Echocardiogram examinations, most recent 08/12/2022. CAD, Arrythmias:Atrial Fibrillation, Signs/Symptoms:Chest Pain; Risk Factors:Hypertension, Dyslipidemia, Family History of Coronary Artery Disease and Former Smoker. HFrEF (Prior 30-35%), Obesity, Coronary Vasospasm.  Sonographer:    Farrel Conners RDCS Referring Phys: [facility] Maxie Debose  IMPRESSIONS   1. Left ventricular ejection fraction by 3D volume is 62 %. The left ventricle has no regional wall motion abnormalities. 2. Mild mitral valve regurgitation. 3. The inferior vena cava is normal in size with greater than 50% respiratory variability, suggesting right atrial pressure of 3 mmHg.  Comparison(s): Prior images reviewed side by side. The left ventricular function has improved.  FINDINGS Left Ventricle: Left ventricular ejection fraction by 3D volume is 62 %. The left ventricle has no regional wall motion abnormalities.  Left Atrium: Left atrial size was normal in size.  Mitral Valve: Mild mitral valve regurgitation.  Pulmonic Valve: Pulmonic valve regurgitation is mild.  Venous: The inferior vena cava is normal in size with greater than 50% respiratory variability, suggesting right atrial pressure of 3 mmHg.  LEFT VENTRICLE PLAX 2D LVIDd:         5.35 cm         Diastology LVIDs:         3.10 cm         LV e' medial:    10.70 cm/s LV PW:  0.95 cm         LV E/e' medial:  7.7 LV IVS:        1.10 cm         LV e' lateral:   10.10 cm/s LVOT diam:     3.10 cm         LV E/e' lateral: 8.2 LV SV:         190 LV SV Index:   87 LVOT Area:     7.55 cm        3D Volume EF LV 3D EF:    Left ventricul ar ejection fraction by 3D volume is 62 %.  3D Volume EF: 3D EF:        62 % LV EDV:       119 ml LV ESV:       45 ml LV SV:        74 ml  RIGHT VENTRICLE RV S prime:     15.10 cm/s  LEFT  ATRIUM             Index        RIGHT ATRIUM           Index LA diam:        4.10 cm 1.87 cm/m   RA Pressure: 3.00 mmHg LA Vol (A2C):   71.0 ml 32.44 ml/m  RA Area:     15.80 cm LA Vol (A4C):   50.2 ml 22.94 ml/m  RA Volume:   45.70 ml  20.88 ml/m LA Biplane Vol: 59.9 ml 27.37 ml/m AORTIC VALVE LVOT Vmax:   117.50 cm/s LVOT Vmean:  76.450 cm/s LVOT VTI:    0.252 m  AORTA Ao Root diam: 3.50 cm Ao Asc diam:  3.90 cm  MITRAL VALVE               TRICUSPID VALVE MV Area (PHT): cm         Estimated RAP:  3.00 mmHg MV Decel Time: 171 msec MV E velocity: 82.70 cm/s  SHUNTS MV A velocity: 62.95 cm/s  Systemic VTI:  0.25 m MV E/A ratio:  1.31        Systemic Diam: 3.10 cm  Donato Schultz MD Electronically signed by Donato Schultz MD Signature Date/Time: 01/03/2023/12:53:50 PM    Final              Recent Labs: 01/22/2023: Magnesium 1.9 02/18/2023: NT-Pro BNP 122 05/23/2023: ALT 11 07/16/2023: BUN 8; Creatinine, Ser 1.18; Hemoglobin 15.0; Platelets 238; Potassium 4.4; Sodium 141; TSH 2.637  Recent Lipid Panel    Component Value Date/Time   CHOL 164 05/23/2023 1008   TRIG 212 (H) 05/23/2023 1008   HDL 37 (L) 05/23/2023 1008   CHOLHDL 4.4 05/23/2023 1008   CHOLHDL 3.9 02/14/2015 0955   VLDL 76 (H) 02/14/2015 0955   LDLCALC 91 05/23/2023 1008   LDLDIRECT 97 02/18/2023 0836     Risk Assessment/Calculations:    CHA2DS2-VASc Score =     This indicates a  % annual risk of stroke. The patient's score is based upon:            Physical Exam:    VS:  BP (!) 102/58   Pulse (!) 55   Ht 6' (1.829 m)   Wt 205 lb 3.2 oz (93.1 kg)   SpO2 94%   BMI 27.83 kg/m     Wt Readings from Last 3 Encounters:  08/18/23 205 lb 3.2 oz (93.1 kg)  08/08/23  205 lb (93 kg)  07/29/23 205 lb 12.8 oz (93.4 kg)    GEN: No distress HEENT: Normal NECK: No JVD CARDIAC: regular bradycardia no murmurs, rubs, gallops RESPIRATORY:  Clear to auscultation without rales, wheezing or rhonchi   ABDOMEN: Soft, non-tender, non-distended MUSCULOSKELETAL:  trace bilateral edema; No deformity  SKIN: Warm and dry NEUROLOGIC:  Alert and oriented x 3 PSYCHIATRIC:  Normal affect   ASSESSMENT:    1. Atrial fibrillation, unspecified type (HCC)   2. Coronary artery disease involving native coronary artery of native heart, unspecified whether angina present   3. Hypercoagulable state due to paroxysmal atrial fibrillation (HCC)   4. Heart failure with improved ejection fraction (HFimpEF) (HCC)      PLAN:     Paroxsymal Atrial Fibrillation,  - CHADSVASC=4. - Continue anticoagulation with eliquis; Acquired Thrombophilia - worsening sx on rate control, continue current medications; has EP f/u 09/01/23; we discussed ablation (my preference) vs Tikosyn load.  If further episodes, we will start multaq or amiodarone   Moderate non obstructive CAD HLD - will restart zetia and continue statin labs in three months - he has atypical chest soreness and DOE; I will increase IMDUR to 90; if this improved CP will continue, if he has low BP with this ( he and his wife will check) we will stop norvasc or at least decrease to 5 mg - if optimized from AF and still had DOE, given his 48 pack year hx would get PFTs with me or PCP - I have a low threshold for PET MPI  Heart Failure Recovered Ejection Fraction  - NYHA class II, Stage C, euvolemic, etiology from AF or alcohol suspected - Diuretic regimen: No  - Discussed the importance of fluid restriction of < 2 L, salt restriction, and checking daily weights  - LVEF recovered, continue current medications  Spring f/u with me or Tereso Newcomer       Medication Adjustments/Labs and Tests Ordered: Current medicines are reviewed at length with the patient today.  Concerns regarding medicines are outlined above.  Orders Placed This Encounter  Procedures   Lipid panel   ALT   EKG 12-Lead   Meds ordered this encounter  Medications   isosorbide  mononitrate (IMDUR) 60 MG 24 hr tablet    Sig: Take 1.5 tablets (90 mg total) by mouth daily.    Dispense:  135 tablet    Refill:  3   ezetimibe (ZETIA) 10 MG tablet    Sig: Take 1 tablet (10 mg total) by mouth daily.    Dispense:  90 tablet    Refill:  3    Patient Instructions  Medication Instructions:  Your physician has recommended you make the following change in your medication:  INCREASE: isosorbide mononitrate (Imdur) to 90 mg by mouth once daily.    RESTART: ezetimibe (Zetia) 10 mg by mouth once daily   *If you need a refill on your cardiac medications before your next appointment, please call your pharmacy*   Lab Work: IN 3 MONTHS: fasting lipid panel and alt (nothing to eat or drink 12 hours before except water) If you have labs (blood work) drawn today and your tests are completely normal, you will receive your results only by: MyChart Message (if you have MyChart) OR A paper copy in the mail If you have any lab test that is abnormal or we need to change your treatment, we will call you to review the results.   Testing/Procedures: NONE   Follow-Up:  At [facility], you and your health needs are our priority.  As part of our continuing mission to provide you with exceptional heart care, we have created designated Provider Care Teams.  These Care Teams include your primary Cardiologist (physician) and Advanced Practice Providers (APPs -  Physician Assistants and Nurse Practitioners) who all work together to provide you with the care you need, when you need it.     Your next appointment:   5 month(s)  Provider:   Christell Constant, MD  or Tereso Newcomer, PA-C      Other Instructions Please monitor your BP as discussed with Dr. Izora Ribas.  Contact our office if you have consistent low BP readings.     Signed, Christell Constant, MD  08/18/2023 9:48 AM    [facility]

## 2023-08-19 ENCOUNTER — Other Ambulatory Visit: Payer: Self-pay

## 2023-08-19 DIAGNOSIS — G8929 Other chronic pain: Secondary | ICD-10-CM

## 2023-08-21 NOTE — Telephone Encounter (Signed)
Patient's wife returns call to nurse line regarding prescription refill.   Please advise.   Veronda Prude, RN

## 2023-08-22 ENCOUNTER — Ambulatory Visit
Admission: RE | Admit: 2023-08-22 | Discharge: 2023-08-22 | Disposition: A | Discharge status: Home / Self Care | Payer: 59 | Source: Ambulatory Visit | Attending: Family Medicine | Admitting: Family Medicine

## 2023-08-22 DIAGNOSIS — M47816 Spondylosis without myelopathy or radiculopathy, lumbar region: Secondary | ICD-10-CM | POA: Diagnosis not present

## 2023-08-22 DIAGNOSIS — M5126 Other intervertebral disc displacement, lumbar region: Secondary | ICD-10-CM | POA: Diagnosis not present

## 2023-08-22 DIAGNOSIS — M5442 Lumbago with sciatica, left side: Secondary | ICD-10-CM

## 2023-08-22 MED ORDER — HYDROCODONE-ACETAMINOPHEN 5-325 MG PO TABS: 1.0000 | ORAL_TABLET | Freq: Two times a day (BID) | ORAL | 0 refills | Status: DC | PRN

## 2023-08-22 MED ORDER — HYDROCODONE-ACETAMINOPHEN 5-325 MG PO TABS
1.0000 | ORAL_TABLET | Freq: Two times a day (BID) | ORAL | 0 refills | Status: DC | PRN
Start: 2023-08-22 — End: 2023-10-17

## 2023-08-22 MED ORDER — GADOPICLENOL 0.5 MMOL/ML IV SOLN
9.0000 mL | Freq: Once | INTRAVENOUS | Status: AC | PRN
  Administered 2023-08-22: 9 mL via INTRAVENOUS

## 2023-08-22 NOTE — Addendum Note (Signed)
Addended by: Veronda Prude on: 08/22/2023 09:34 AM   Modules accepted: Orders

## 2023-09-01 ENCOUNTER — Telehealth: Payer: Self-pay

## 2023-09-01 ENCOUNTER — Ambulatory Visit: Payer: 59 | Attending: Cardiovascular Disease | Admitting: Cardiovascular Disease

## 2023-09-01 ENCOUNTER — Encounter: Payer: Self-pay | Admitting: Cardiovascular Disease

## 2023-09-01 VITALS — BP 110/66 | HR 55 | Ht 72.0 in | Wt 206.4 lb

## 2023-09-01 DIAGNOSIS — I4819 Other persistent atrial fibrillation: Secondary | ICD-10-CM

## 2023-09-01 NOTE — Patient Instructions (Signed)
Medication Instructions:  Your physician recommends that you continue on your current medications as directed. Please refer to the Current Medication list given to you today. *If you need a refill on your cardiac medications before your next appointment, please call your pharmacy*   Lab Work: CBC and BMET on 11/16/23 If you have labs (blood work) drawn today and your tests are completely normal, you will receive your results only by: MyChart Message (if you have MyChart) OR A paper copy in the mail If you have any lab test that is abnormal or we need to change your treatment, we will call you to review the results.   Testing/Procedures: Cardiac CT - the hospital will contact you to set this up Your physician has requested that you have cardiac CT. Cardiac computed tomography (CT) is a painless test that uses an x-ray machine to take clear, detailed pictures of your heart. For further information please visit [email or web address] Please follow instruction sheet as given.  Atrial Fibrillation Ablation  Your physician has recommended that you have an ablation. Catheter ablation is a medical procedure used to treat some cardiac arrhythmias (irregular heartbeats). During catheter ablation, a long, thin, flexible tube is put into a blood vessel in your groin (upper thigh), or neck. This tube is called an ablation catheter. It is then guided to your heart through the blood vessel. Radio frequency waves destroy small areas of heart tissue where abnormal heartbeats may cause an arrhythmia to start. Please see the instruction sheet given to you today.  You are scheduled for Atrial Fibrillation Ablation on Monday, March 24 with Dr. Halford Chessman.Please arrive at the Main Entrance A at [facility]: [address] [place], [place] [address] at 8:00 AM     Follow-Up: At [facility], you and your health needs are our priority.  As part of our continuing mission to provide you with exceptional  heart care, we have created designated Provider Care Teams.  These Care Teams include your primary Cardiologist (physician) and Advanced Practice Providers (APPs -  Physician Assistants and Nurse Practitioners) who all work together to provide you with the care you need, when you need it.  We recommend signing up for the patient portal called "MyChart".  Sign up information is provided on this After Visit Summary.  MyChart is used to connect with patients for Virtual Visits (Telemedicine).  Patients are able to view lab/test results, encounter notes, upcoming appointments, etc.  Non-urgent messages can be sent to your provider as well.   To learn more about what you can do with MyChart, go to [email or web address].    Your next appointment:   We will schedule follow up after your ablation for you  Provider:   York Pellant, MD

## 2023-09-01 NOTE — Progress Notes (Signed)
Electrophysiology Office Note:    Date:  09/01/2023   ID:  Corey Mole Sr., DOB June 03, 1956, MRN [id number]  PCP:  Alicia Amel, MD    [facility] Providers Cardiologist:  Christell Constant, MD     Referring MD: Eustace Pen, PA-C   History of Present Illness:    Corey Watts. is a 67 y.o. male with a medical history significant for coronary vasospasm, coronary artery disease, persistent atrial fibrillation, referred for management of AF.     I discussed the use of AI scribe software for clinical note transcription with the patient, who gave verbal consent to proceed.  The patient, with a history of coronary disease and coronary vasospasm, was first diagnosed with atrial fibrillation (AFib) in December 2023. He underwent cardioversion shortly after diagnosis and again in November 2024 due to a recurrence of AFib. An EKG in May 2024 showed atrial flutter. The patient's ejection fraction (EF) was initially 30-35%, attributed to rapid ventricular response (RVR), but recovered by April 2024. He reports significant weight loss after discontinuing alcohol use and cessation of snoring.  The patient's AFib symptoms include palpitations, shortness of breath, fatigue, and a general lack of motivation. The most recent episode occurred in October 2024, requiring another cardioversion. The patient is currently on metoprolol and losartan, with the former helping to control the heart rate.     Today, he reports that he is doing well  EKGs/Labs/Other Studies Reviewed Today:     Echocardiogram:  November 2023 EF 30 to 35% mildly dilated left atrium, moderately dilated right atrium. The EF had recovered by the time of a follow-up echocardiogram in April 2024     EKG:   EKG Interpretation Date/Time:  Monday September 01 2023 09:50:00 EST Ventricular Rate:  55 PR Interval:  136 QRS Duration:  82 QT Interval:  402 QTC Calculation: 384 R Axis:   61  Text  Interpretation: Sinus bradycardia Nonspecific ST and T wave abnormality When compared with ECG of 18-Aug-2023 09:02, No significant change was found Confirmed by York Pellant [phone]) on 09/01/2023 10:15:37 AM     Physical Exam:    VS:  BP 110/66 (BP Location: Left Arm, Patient Position: Sitting, Cuff Size: Large)   Pulse (!) 55   Ht 6' (1.829 m)   Wt 206 lb 6.4 oz (93.6 kg)   SpO2 97%   BMI 27.99 kg/m     Wt Readings from Last 3 Encounters:  09/01/23 206 lb 6.4 oz (93.6 kg)  08/18/23 205 lb 3.2 oz (93.1 kg)  08/08/23 205 lb (93 kg)     GEN: Well nourished, well developed in no acute distress CARDIAC: RRR, no murmurs, rubs, gallops RESPIRATORY:  Normal work of breathing MUSCULOSKELETAL: no edema    ASSESSMENT & PLAN:     Persistent atrial fibrillation Symptomatic with shortness of breath, palpitations Has a history of tachycardia induced cardiomyopathy We discussed management options.  He would like to pursue rhythm control with ablation If he has recurrence prior to ablation, we will need to start amiodarone He has also had typical appearing atrial flutter on EKG in May 2024.  Will plan for CTI line at the time of ablation.  We discussed the indication, rationale, logistics, anticipated benefits, and potential risks of the ablation procedure including but not limited to -- bleed at the groin access site, chest pain, damage to nearby organs such as the diaphragm, lungs, or esophagus, need for a drainage tube, or prolonged hospitalization. I  explained that the risk for stroke, heart attack, need for open chest surgery, or even death is very low but not zero. he  expressed understanding and wishes to proceed.   Secondary hypercoagulable state Continue apixaban 5 BID    Signed, Maurice Small, MD  09/01/2023 10:26 AM    [facility]

## 2023-09-01 NOTE — Telephone Encounter (Signed)
Patient calls nurse line requesting MRI results.   Advised imaging is taking longer than normal to result back.   Advised will call him as soon as we receive report.   Will forward to PCP.

## 2023-09-05 ENCOUNTER — Other Ambulatory Visit: Payer: Self-pay | Admitting: Student

## 2023-09-05 DIAGNOSIS — G8929 Other chronic pain: Secondary | ICD-10-CM

## 2023-09-05 DIAGNOSIS — M5442 Lumbago with sciatica, left side: Secondary | ICD-10-CM

## 2023-09-19 ENCOUNTER — Ambulatory Visit: Payer: 59 | Admitting: Physical Medicine & Rehabilitation

## 2023-09-30 ENCOUNTER — Other Ambulatory Visit: Payer: Self-pay

## 2023-09-30 DIAGNOSIS — I5020 Unspecified systolic (congestive) heart failure: Secondary | ICD-10-CM

## 2023-09-30 DIAGNOSIS — I2510 Atherosclerotic heart disease of native coronary artery without angina pectoris: Secondary | ICD-10-CM

## 2023-10-02 MED ORDER — NITROGLYCERIN 0.4 MG SL SUBL: SUBLINGUAL_TABLET | SUBLINGUAL | 0 refills | Status: DC

## 2023-10-02 MED ORDER — EMPAGLIFLOZIN 10 MG PO TABS: 10.0000 mg | ORAL_TABLET | Freq: Every day | ORAL | 3 refills | Status: DC

## 2023-10-17 ENCOUNTER — Ambulatory Visit (INDEPENDENT_AMBULATORY_CARE_PROVIDER_SITE_OTHER): Payer: 59 | Admitting: Student

## 2023-10-17 ENCOUNTER — Encounter: Payer: Self-pay | Admitting: Student

## 2023-10-17 VITALS — BP 122/60 | HR 57 | Wt 207.0 lb

## 2023-10-17 DIAGNOSIS — M545 Low back pain, unspecified: Secondary | ICD-10-CM | POA: Diagnosis not present

## 2023-10-17 DIAGNOSIS — I4819 Other persistent atrial fibrillation: Secondary | ICD-10-CM

## 2023-10-17 DIAGNOSIS — G8929 Other chronic pain: Secondary | ICD-10-CM

## 2023-10-17 DIAGNOSIS — I480 Paroxysmal atrial fibrillation: Secondary | ICD-10-CM

## 2023-10-17 DIAGNOSIS — I25118 Atherosclerotic heart disease of native coronary artery with other forms of angina pectoris: Secondary | ICD-10-CM

## 2023-10-17 DIAGNOSIS — R0602 Shortness of breath: Secondary | ICD-10-CM | POA: Diagnosis not present

## 2023-10-17 MED ORDER — TETANUS-DIPHTH-ACELL PERTUSSIS 5-2.5-18.5 LF-MCG/0.5 IM SUSP: 0.5000 mL | Freq: Once | INTRAMUSCULAR | 0 refills | Status: AC

## 2023-10-17 MED ORDER — HYDROCODONE-ACETAMINOPHEN 5-325 MG PO TABS: 1.0000 | ORAL_TABLET | Freq: Two times a day (BID) | ORAL | 0 refills | Status: DC | PRN

## 2023-10-17 MED ORDER — NITROGLYCERIN 0.4 MG SL SUBL: SUBLINGUAL_TABLET | SUBLINGUAL | 0 refills | Status: AC

## 2023-10-17 NOTE — Progress Notes (Unsigned)
     SUBJECTIVE:   CHIEF COMPLAINT / HPI:   A Fib  Shortness of Breath Scheduled for an ablation with Dr. Nelly Laurence on 3/24.  Still has occasional runs of A-fib where he is symptomatic.  His most notable symptom is shortness of breath.  He doesn't seem to have issues with SOB when he is not in A Fib. No symptoms today.   Chronic Low Back Pain MR in Dec 2024 with diffuse degenerative changes in addition to post-surgical changes from prior operations.   Angina Pectoris Tells me he needs refill of his Nitroglycerin. Does not need this frequently. Maybe once a month or less. Last dose was 1/13. Pain is exertional and does improve with rest and nitroglycerin use. He is plugged in with cardiology and it appears they have ordered for a Lexiscan. He never needs more than one dose of nitroglycerin to relieve his CP. No CP or SOB today.   PERTINENT  PMH / PSH: CAD, A Fib on Eliquis, HF with improved EF  OBJECTIVE:   BP 122/60   Pulse (!) 57   Wt 207 lb (93.9 kg)   SpO2 92%   BMI 28.07 kg/m   Gen: Well-appearing and NAD.  HENT: MMM Neck: Without JVD Cardio: RRR, no murmur Pulm: Normal WOB on RA, speaking in full sentences, lungs are clear throughout Ext: without edema or deformity   ASSESSMENT/PLAN:   Assessment & Plan Paroxysmal atrial fibrillation (HCC) Regular rate and rhythm today.  Still having symptomatic runs of symptomatic A-fib in the community.  I suspect that this is the primary driver of his shortness of breath.  Given benign pulmonary exam today and symptoms seem to coincide with his runs of A-fib, I will defer any further workup of his dyspnea until after his ablation when we have his A-fib under better control. Atherosclerosis of native coronary artery of native heart with stable angina pectoris (HCC) Seems that his angina remains stable. Associated with activity. Resolves with rest and nitroglycerin. Not needing NG too frequently.  He is plugged in with cardiology and has a  Lexi scan planned. -Refilled his nitroglycerin per her request, plans to follow-up with cardiology -ER precautions reviewed for increasing frequency of CP or CP that does not resolve with rest or requires multiple doses of NG Chronic midline low back pain without sciatica I referred him to Dr. Yevette Edwards at her last visit.  He tells me he has not heard anything back from them. -Will ask referral coordinator to follow-up on this     J Dorothyann Gibbs, MD [facility]

## 2023-10-17 NOTE — Assessment & Plan Note (Signed)
Regular rate and rhythm today.  Still having symptomatic runs of symptomatic A-fib in the community.  I suspect that this is the primary driver of his shortness of breath.  Given benign pulmonary exam today and symptoms seem to coincide with his runs of A-fib, I will defer any further workup of his dyspnea until after his ablation when we have his A-fib under better control.

## 2023-10-17 NOTE — Patient Instructions (Signed)
I will follow up on the referral to Dr. Yevette Edwards. If you havent heard anything in 2 weeks, let me know.  Let's see how your shortness of breath does after your ablation. If it is persistent, we can do a bit more workup but I think it's best to hold off for now.  Come back to see me in May.  Eliezer Mccoy, MD

## 2023-10-18 NOTE — Assessment & Plan Note (Signed)
I referred him to Dr. Yevette Edwards at her last visit.  He tells me he has not heard anything back from them. -Will ask referral coordinator to follow-up on this

## 2023-10-18 NOTE — Assessment & Plan Note (Addendum)
Seems that his angina remains stable. Associated with activity. Resolves with rest and nitroglycerin. Not needing NG too frequently.  He is plugged in with cardiology and has a Lexi scan planned. -Refilled his nitroglycerin per her request, plans to follow-up with cardiology -ER precautions reviewed for increasing frequency of CP or CP that does not resolve with rest or requires multiple doses of NG

## 2023-11-08 ENCOUNTER — Other Ambulatory Visit: Payer: Self-pay | Admitting: Student

## 2023-11-08 DIAGNOSIS — M10061 Idiopathic gout, right knee: Secondary | ICD-10-CM

## 2023-11-10 ENCOUNTER — Other Ambulatory Visit: Payer: Self-pay

## 2023-11-10 ENCOUNTER — Other Ambulatory Visit: Payer: Self-pay | Admitting: Physical Medicine & Rehabilitation

## 2023-11-10 NOTE — Telephone Encounter (Signed)
 Patient's wife calls nurse line regarding prescription refill on pregabalin. Advised that pain management provider, Dr. Natale Lay last prescribed this medication.   Wife reports that they have not went back to this provider as they were informed that there was not much that could be done for his pain other than an expensive procedure.   She is asking if Dr. Marisue Humble can take over prescribing this medication.   Forwarding request to PCP.   Veronda Prude, RN

## 2023-11-11 ENCOUNTER — Telehealth (HOSPITAL_COMMUNITY): Payer: Self-pay

## 2023-11-11 NOTE — Telephone Encounter (Signed)
 Spoke with patient to complete one month pre-procedure call.     New medical conditions? No Recent hospitalizations or surgeries? No Started any new medications? No Patient made aware to contact office to inform of any new medications started. Any changes in activities of daily living? No  Pre-procedure testing scheduled: CT on 11/14/23 and lab work ordered.  Confirmed patient is taking Eliquis and will continue taking medication before procedure or it may need to be rescheduled.  Confirmed patient is scheduled for Atrial Fibrillation Ablation on Monday, March 24 with Dr. York Pellant. Instructed patient to arrive at the Main Entrance A at [facility]: [address] [place], [place] [address] and check in at Admitting at 8:00 AM  Advised of plan to go home the same day and will only stay overnight if medically necessary. You MUST have a responsible adult to drive you home and MUST be with you the first 24 hours after you arrive home or your procedure could be cancelled.  Patient verbalized understanding to information provided and is agreeable to proceed with procedure.

## 2023-11-12 ENCOUNTER — Other Ambulatory Visit (HOSPITAL_COMMUNITY): Payer: 59

## 2023-11-12 DIAGNOSIS — I2510 Atherosclerotic heart disease of native coronary artery without angina pectoris: Secondary | ICD-10-CM | POA: Diagnosis not present

## 2023-11-12 DIAGNOSIS — I4819 Other persistent atrial fibrillation: Secondary | ICD-10-CM | POA: Diagnosis not present

## 2023-11-12 LAB — LIPID PANEL
Chol/HDL Ratio: 3 ratio (ref 0.0–5.0)
Cholesterol, Total: 114 mg/dL (ref 100–199)
HDL: 38 mg/dL — ABNORMAL LOW (ref >39)
LDL Chol Calc (NIH): 50 mg/dL (ref 0–99)
Triglycerides: 151 mg/dL — ABNORMAL HIGH (ref 0–149)
VLDL Cholesterol Cal: 26 mg/dL (ref 5–40)

## 2023-11-12 LAB — BASIC METABOLIC PANEL
BUN/Creatinine Ratio: 13 (ref 10–24)
BUN: 15 mg/dL (ref 8–27)
CO2: 21 mmol/L (ref 20–29)
Calcium: 9.7 mg/dL (ref 8.6–10.2)
Chloride: 106 mmol/L (ref 96–106)
Creatinine, Ser: 1.19 mg/dL (ref 0.76–1.27)
Glucose: 121 mg/dL — ABNORMAL HIGH (ref 70–99)
Potassium: 4.4 mmol/L (ref 3.5–5.2)
Sodium: 144 mmol/L (ref 134–144)
eGFR: 67 mL/min/{1.73_m2} (ref >59)

## 2023-11-12 LAB — ALT: ALT: 13 IU/L (ref 0–44)

## 2023-11-13 ENCOUNTER — Encounter: Payer: Self-pay | Admitting: Cardiovascular Disease

## 2023-11-13 ENCOUNTER — Encounter: Payer: Self-pay | Admitting: Internal Medicine

## 2023-11-13 LAB — CBC
Hematocrit: 46.5 % (ref 37.5–51.0)
Hemoglobin: 15.4 g/dL (ref 13.0–17.7)
MCH: 30.7 pg (ref 26.6–33.0)
MCHC: 33.1 g/dL (ref 31.5–35.7)
MCV: 93 fL (ref 79–97)
Platelets: 195 10*3/uL (ref 150–450)
RBC: 5.01 x10E6/uL (ref 4.14–5.80)
RDW: 14.5 % (ref 11.6–15.4)
WBC: 6.9 10*3/uL (ref 3.4–10.8)

## 2023-11-14 ENCOUNTER — Ambulatory Visit (HOSPITAL_COMMUNITY)
Admission: RE | Admit: 2023-11-14 | Discharge: 2023-11-14 | Disposition: A | Discharge status: Home / Self Care | Payer: 59 | Source: Ambulatory Visit | Attending: Cardiovascular Disease | Admitting: Cardiovascular Disease

## 2023-11-14 DIAGNOSIS — I4819 Other persistent atrial fibrillation: Secondary | ICD-10-CM | POA: Diagnosis not present

## 2023-11-14 MED ORDER — IOHEXOL 350 MG/ML SOLN
95.0000 mL | Freq: Once | INTRAVENOUS | Status: AC | PRN
  Administered 2023-11-14: 95 mL via INTRAVENOUS

## 2023-11-16 ENCOUNTER — Encounter: Payer: Self-pay | Admitting: Cardiovascular Disease

## 2023-12-01 ENCOUNTER — Telehealth (HOSPITAL_COMMUNITY): Payer: Self-pay

## 2023-12-01 NOTE — Telephone Encounter (Signed)
 Call placed to patient to discuss upcoming procedure.   CT: completed.  Labs: completed.   Any recent signs of acute illness or been started on antibiotics? No Any new medications started? No Any medications to hold? Jardiance 3 days Any missed doses of blood thinner?  No Advised patient to continue taking ANTICOAGULANT: Eliquis (Apixaban) without missing any doses.  Medication instructions:  On the morning of your procedure DO NOT take any medication., including Eliquis or the procedure may be rescheduled. Nothing to eat or drink after midnight prior to your procedure.  Confirmed patient is scheduled for Atrial Fibrillation Ablation on Monday, March 24 with Dr. York Pellant. Instructed patient to arrive at the Main Entrance A at [facility]: [address] [place], [place] [address] and check in at Admitting at 8:00 AM.  Advised of plan to go home the same day and will only stay overnight if medically necessary. You MUST have a responsible adult to drive you home and MUST be with you the first 24 hours after you arrive home or your procedure could be cancelled.  Patient verbalized understanding to all instructions provided and agreed to proceed with procedure.

## 2023-12-05 ENCOUNTER — Telehealth: Payer: Self-pay | Admitting: Cardiovascular Disease

## 2023-12-05 NOTE — Pre-Procedure Instructions (Signed)
Instructed patient on the following items: Arrival time 0800 Nothing to eat or drink after midnight No meds AM of procedure Responsible person to drive you home and stay with you for 24 hrs  Have you missed any doses of anti-coagulant Eliquis- takes twice a day, hasn't missed any doses.  Don't take dose on Monday morning.

## 2023-12-05 NOTE — Telephone Encounter (Signed)
 Edward with [facility] is calling to report STAT CT results.

## 2023-12-05 NOTE — Telephone Encounter (Signed)
 Spoke with [facility] who is calling re over read of CT cardiac.  Patient is scheduled for ablation on 12/08/2023.  Ramon Dredge is calling to report finding of1.42mm pleural based, upper right lobe pulmonary nodule versus focal scar.  Recommended non-contrast chest CT in 6-12 months.  Over read posted as of 12/04/23. CT reviewed by Dr Clifton James who recommends to make Dr Nelly Laurence aware.  Will forward to Dr Nelly Laurence and EP navigator N. Breedlove,RN to make them aware.

## 2023-12-08 ENCOUNTER — Ambulatory Visit (HOSPITAL_BASED_OUTPATIENT_CLINIC_OR_DEPARTMENT_OTHER): Admitting: Anesthesiology

## 2023-12-08 ENCOUNTER — Encounter (HOSPITAL_COMMUNITY): Payer: Self-pay | Admitting: Cardiovascular Disease

## 2023-12-08 ENCOUNTER — Other Ambulatory Visit: Payer: Self-pay

## 2023-12-08 ENCOUNTER — Ambulatory Visit (HOSPITAL_COMMUNITY)
Admission: RE | Admit: 2023-12-08 | Discharge: 2023-12-08 | Disposition: A | Discharge status: Home / Self Care | Payer: 59 | Source: Ambulatory Visit | Attending: Cardiovascular Disease | Admitting: Cardiovascular Disease

## 2023-12-08 ENCOUNTER — Ambulatory Visit (HOSPITAL_COMMUNITY): Admitting: Anesthesiology

## 2023-12-08 ENCOUNTER — Ambulatory Visit (HOSPITAL_COMMUNITY)
Admission: RE | Disposition: A | Discharge status: Home / Self Care | Payer: Self-pay | Source: Ambulatory Visit | Attending: Cardiovascular Disease

## 2023-12-08 DIAGNOSIS — I4819 Other persistent atrial fibrillation: Secondary | ICD-10-CM | POA: Diagnosis not present

## 2023-12-08 DIAGNOSIS — Z7901 Long term (current) use of anticoagulants: Secondary | ICD-10-CM | POA: Diagnosis not present

## 2023-12-08 DIAGNOSIS — I10 Essential (primary) hypertension: Secondary | ICD-10-CM | POA: Insufficient documentation

## 2023-12-08 DIAGNOSIS — I2510 Atherosclerotic heart disease of native coronary artery without angina pectoris: Secondary | ICD-10-CM | POA: Diagnosis not present

## 2023-12-08 DIAGNOSIS — I483 Typical atrial flutter: Secondary | ICD-10-CM | POA: Diagnosis not present

## 2023-12-08 DIAGNOSIS — Z79899 Other long term (current) drug therapy: Secondary | ICD-10-CM | POA: Diagnosis not present

## 2023-12-08 DIAGNOSIS — K219 Gastro-esophageal reflux disease without esophagitis: Secondary | ICD-10-CM | POA: Diagnosis not present

## 2023-12-08 DIAGNOSIS — D6869 Other thrombophilia: Secondary | ICD-10-CM | POA: Insufficient documentation

## 2023-12-08 DIAGNOSIS — R911 Solitary pulmonary nodule: Secondary | ICD-10-CM | POA: Diagnosis not present

## 2023-12-08 DIAGNOSIS — I428 Other cardiomyopathies: Secondary | ICD-10-CM | POA: Insufficient documentation

## 2023-12-08 DIAGNOSIS — Z87891 Personal history of nicotine dependence: Secondary | ICD-10-CM

## 2023-12-08 HISTORY — PX: ATRIAL FIBRILLATION ABLATION: EP1191

## 2023-12-08 LAB — GLUCOSE, CAPILLARY: Glucose-Capillary: 104 mg/dL — ABNORMAL HIGH (ref 70–99)

## 2023-12-08 SURGERY — ATRIAL FIBRILLATION ABLATION
Anesthesia: General

## 2023-12-08 MED ORDER — SODIUM CHLORIDE 0.9% FLUSH: 3.0000 mL | INTRAVENOUS | Status: DC | PRN

## 2023-12-08 MED ORDER — SODIUM CHLORIDE 0.9 % IV SOLN: 250.0000 mL | INTRAVENOUS | Status: DC | PRN

## 2023-12-08 MED ORDER — ACETAMINOPHEN 325 MG PO TABS: 650.0000 mg | ORAL_TABLET | ORAL | Status: DC | PRN

## 2023-12-08 MED ORDER — SODIUM CHLORIDE 0.9% FLUSH: 3.0000 mL | Freq: Two times a day (BID) | INTRAVENOUS | Status: DC

## 2023-12-08 MED ORDER — PHENYLEPHRINE HCL-NACL 20-0.9 MG/250ML-% IV SOLN
INTRAVENOUS | Status: DC | PRN
  Administered 2023-12-08: 50 ug/min via INTRAVENOUS

## 2023-12-08 MED ORDER — FENTANYL CITRATE (PF) 100 MCG/2ML IJ SOLN
INTRAMUSCULAR | Status: DC | PRN
  Administered 2023-12-08 (×2): 50 ug via INTRAVENOUS

## 2023-12-08 MED ORDER — PHENYLEPHRINE 80 MCG/ML (10ML) SYRINGE FOR IV PUSH (FOR BLOOD PRESSURE SUPPORT)
PREFILLED_SYRINGE | INTRAVENOUS | Status: DC | PRN
  Administered 2023-12-08 (×6): 160 ug via INTRAVENOUS

## 2023-12-08 MED ORDER — ATROPINE SULFATE 1 MG/ML IV SOLN
INTRAVENOUS | Status: DC | PRN
  Administered 2023-12-08: 1 mg via INTRAVENOUS

## 2023-12-08 MED ORDER — ONDANSETRON HCL 4 MG/2ML IJ SOLN: 4.0000 mg | Freq: Four times a day (QID) | INTRAMUSCULAR | Status: DC | PRN

## 2023-12-08 MED ORDER — PROTAMINE SULFATE 10 MG/ML IV SOLN
INTRAVENOUS | Status: DC | PRN
Start: 2023-12-08 — End: 2023-12-08
  Administered 2023-12-08 (×2): 25 mg via INTRAVENOUS

## 2023-12-08 MED ORDER — FENTANYL CITRATE (PF) 100 MCG/2ML IJ SOLN
INTRAMUSCULAR | Status: AC
  Filled 2023-12-08: qty 2

## 2023-12-08 MED ORDER — SUGAMMADEX SODIUM 200 MG/2ML IV SOLN
INTRAVENOUS | Status: DC | PRN
  Administered 2023-12-08: 400 mg via INTRAVENOUS

## 2023-12-08 MED ORDER — NITROGLYCERIN 1 MG/10 ML FOR IR/CATH LAB
INTRA_ARTERIAL | Status: DC | PRN
  Administered 2023-12-08: 20 mL

## 2023-12-08 MED ORDER — PHENYLEPHRINE HCL (PRESSORS) 10 MG/ML IV SOLN
INTRAVENOUS | Status: DC | PRN
  Administered 2023-12-08: 200 ug via INTRAVENOUS

## 2023-12-08 MED ORDER — ATROPINE SULFATE 1 MG/10ML IJ SOSY
PREFILLED_SYRINGE | INTRAMUSCULAR | Status: AC
  Filled 2023-12-08: qty 10

## 2023-12-08 MED ORDER — EPHEDRINE SULFATE-NACL 50-0.9 MG/10ML-% IV SOSY
PREFILLED_SYRINGE | INTRAVENOUS | Status: DC | PRN
  Administered 2023-12-08: 10 mg via INTRAVENOUS

## 2023-12-08 MED ORDER — NITROGLYCERIN 1 MG/10 ML FOR IR/CATH LAB
INTRA_ARTERIAL | Status: AC
  Filled 2023-12-08: qty 20

## 2023-12-08 MED ORDER — FENTANYL CITRATE (PF) 250 MCG/5ML IJ SOLN: INTRAMUSCULAR | Status: DC | PRN

## 2023-12-08 MED ORDER — ROCURONIUM BROMIDE 10 MG/ML (PF) SYRINGE
PREFILLED_SYRINGE | INTRAVENOUS | Status: DC | PRN
  Administered 2023-12-08 (×2): 20 mg via INTRAVENOUS
  Administered 2023-12-08: 50 mg via INTRAVENOUS

## 2023-12-08 MED ORDER — HEPARIN SODIUM (PORCINE) 1000 UNIT/ML IJ SOLN
INTRAMUSCULAR | Status: AC
  Filled 2023-12-08: qty 10

## 2023-12-08 MED ORDER — HEPARIN SODIUM (PORCINE) 1000 UNIT/ML IJ SOLN
INTRAMUSCULAR | Status: DC | PRN
  Administered 2023-12-08: 18000 [IU] via INTRAVENOUS

## 2023-12-08 MED ORDER — LIDOCAINE 2% (20 MG/ML) 5 ML SYRINGE
INTRAMUSCULAR | Status: DC | PRN
  Administered 2023-12-08: 100 mg via INTRAVENOUS

## 2023-12-08 MED ORDER — PROPOFOL 10 MG/ML IV BOLUS
INTRAVENOUS | Status: DC | PRN
  Administered 2023-12-08: 130 mg via INTRAVENOUS

## 2023-12-08 MED ORDER — SODIUM CHLORIDE 0.9 % IV SOLN: INTRAVENOUS | Status: DC

## 2023-12-08 MED ORDER — HEPARIN (PORCINE) IN NACL 1000-0.9 UT/500ML-% IV SOLN
INTRAVENOUS | Status: DC | PRN
  Administered 2023-12-08 (×3): 500 mL

## 2023-12-08 MED ORDER — DEXAMETHASONE SODIUM PHOSPHATE 10 MG/ML IJ SOLN
INTRAMUSCULAR | Status: DC | PRN
  Administered 2023-12-08: 10 mg via INTRAVENOUS

## 2023-12-08 MED ORDER — ONDANSETRON HCL 4 MG/2ML IJ SOLN
INTRAMUSCULAR | Status: DC | PRN
  Administered 2023-12-08: 4 mg via INTRAVENOUS

## 2023-12-08 SURGICAL SUPPLY — 22 items
BAG SNAP BAND KOVER 36X36 (MISCELLANEOUS) IMPLANT
CABLE PFA RX CATH CONN (CABLE) IMPLANT
CATH ABLAT QDOT MICRO BI TC DF (CATHETERS) IMPLANT
CATH FARAWAVE ABLATION 31 (CATHETERS) IMPLANT
CATH OCTARAY 2.0 F 3-3-3-3-3 (CATHETERS) IMPLANT
CATH SOUNDSTAR ECO 8FR (CATHETERS) IMPLANT
CATH WEBSTER BI DIR CS D-F CRV (CATHETERS) IMPLANT
CLOSURE PERCLOSE PROSTYLE (VASCULAR PRODUCTS) IMPLANT
COVER SWIFTLINK CONNECTOR (BAG) ×1 IMPLANT
DEVICE CLOSURE MYNXGRIP 6/7F (Vascular Products) IMPLANT
DILATOR VESSEL 38 20CM 16FR (INTRODUCER) IMPLANT
GUIDEWIRE INQWIRE 1.5J.035X260 (WIRE) IMPLANT
INQWIRE 1.5J .035X260CM (WIRE) ×1 IMPLANT
KIT VERSACROSS CNCT FARADRIVE (KITS) IMPLANT
PACK EP LF (CUSTOM PROCEDURE TRAY) ×1 IMPLANT
PAD DEFIB RADIO PHYSIO CONN (PAD) ×1 IMPLANT
PATCH CARTO3 (PAD) IMPLANT
SHEATH FARADRIVE STEERABLE (SHEATH) IMPLANT
SHEATH PINNACLE 8F 10CM (SHEATH) IMPLANT
SHEATH PINNACLE 9F 10CM (SHEATH) IMPLANT
SHEATH PROBE COVER 6X72 (BAG) IMPLANT
TUBING SMART ABLATE COOLFLOW (TUBING) IMPLANT

## 2023-12-08 NOTE — Anesthesia Preprocedure Evaluation (Addendum)
 Anesthesia Evaluation  Patient identified by MRN, date of birth, ID band Patient awake    Reviewed: Allergy & Precautions, NPO status , Patient's Chart, lab work & pertinent test results, reviewed documented beta blocker date and time   History of Anesthesia Complications Negative for: history of anesthetic complications  Airway Mallampati: II  TM Distance: >3 FB Neck ROM: Full    Dental  (+) Edentulous Lower, Edentulous Upper   Pulmonary former smoker   Pulmonary exam normal        Cardiovascular hypertension, Pt. on medications and Pt. on home beta blockers + CAD  Normal cardiovascular exam+ dysrhythmias (on Eliquis) Atrial Fibrillation      Neuro/Psych negative neurological ROS     GI/Hepatic Neg liver ROS,GERD  Medicated,,  Endo/Other  negative endocrine ROS    Renal/GU negative Renal ROS     Musculoskeletal negative musculoskeletal ROS (+)    Abdominal   Peds  Hematology negative hematology ROS (+)   Anesthesia Other Findings Day of surgery medications reviewed with patient.  Reproductive/Obstetrics                             Anesthesia Physical Anesthesia Plan  ASA: 3  Anesthesia Plan: General   Post-op Pain Management: Minimal or no pain anticipated   Induction: Intravenous  PONV Risk Score and Plan: 2 and Treatment may vary due to age or medical condition, Ondansetron, Dexamethasone and Midazolam  Airway Management Planned: Oral ETT  Additional Equipment: None  Intra-op Plan:   Post-operative Plan: Extubation in OR  Informed Consent: I have reviewed the patients History and Physical, chart, labs and discussed the procedure including the risks, benefits and alternatives for the proposed anesthesia with the patient or authorized representative who has indicated his/her understanding and acceptance.     Dental advisory given  Plan Discussed with:  CRNA  Anesthesia Plan Comments: (Accepts blood products in emergency situation. Stephannie Peters, MD)       Anesthesia Quick Evaluation

## 2023-12-08 NOTE — Anesthesia Procedure Notes (Signed)
 Procedure Name: Intubation Date/Time: 12/08/2023 10:05 AM  Performed by: Orlin Hilding, CRNAPre-anesthesia Checklist: Patient identified, Emergency Drugs available, Suction available, Patient being monitored and Timeout performed Patient Re-evaluated:Patient Re-evaluated prior to induction Oxygen Delivery Method: Circle system utilized Preoxygenation: Pre-oxygenation with 100% oxygen Induction Type: IV induction Ventilation: Oral airway inserted - appropriate to patient size, Two handed mask ventilation required and Mask ventilation without difficulty Laryngoscope Size: Mac and 4 Grade View: Grade I Tube type: Oral Tube size: 7.5 mm Number of attempts: 1 Placement Confirmation: ETT inserted through vocal cords under direct vision, positive ETCO2 and breath sounds checked- equal and bilateral Secured at: 23 cm Tube secured with: Tape Dental Injury: Teeth and Oropharynx as per pre-operative assessment

## 2023-12-08 NOTE — H&P (Signed)
 Electrophysiology Office Note:    Date:  12/08/2023   ID:  Corey Mole Sr., DOB Jul 19, 1956, MRN [id number]  PCP:  Alicia Amel, MD   [facility] Providers Cardiologist:  Christell Constant, MD Electrophysiologist:  Maurice Small, MD     Referring MD: No ref. provider found   History of Present Illness:    Corey Waldridge. is a 68 y.o. male with a medical history significant for coronary vasospasm, coronary artery disease, persistent atrial fibrillation, referred for management of AF.     I discussed the use of AI scribe software for clinical note transcription with the patient, who gave verbal consent to proceed.  The patient, with a history of coronary disease and coronary vasospasm, was first diagnosed with atrial fibrillation (AFib) in December 2023. He underwent cardioversion shortly after diagnosis and again in November 2024 due to a recurrence of AFib. An EKG in May 2024 showed atrial flutter. The patient's ejection fraction (EF) was initially 30-35%, attributed to rapid ventricular response (RVR), but recovered by April 2024. He reports significant weight loss after discontinuing alcohol use and cessation of snoring.  The patient's AFib symptoms include palpitations, shortness of breath, fatigue, and a general lack of motivation. The most recent episode occurred in October 2024, requiring another cardioversion. The patient is currently on metoprolol and losartan, with the former helping to control the heart rate.     Today, he reports that he is doing well. He has been having a lot of AF in the past 3 weeks.  I reviewed the patient's CT and labs. There was no LAA thrombus. he  has not missed any doses of anticoagulation, and he took his dose last night. There have been no changes in the patient's diagnoses, medications, or condition since our recent clinic visit.   EKGs/Labs/Other Studies Reviewed Today:     Echocardiogram:  November 2023 EF 30  to 35% mildly dilated left atrium, moderately dilated right atrium. The EF had recovered by the time of a follow-up echocardiogram in April 2024     EKG:         Physical Exam:    VS:  BP (!) 123/93   Pulse 93   Temp 98.4 F (36.9 C) (Oral)   Resp 18   Ht 5\' 8"  (1.727 m)   Wt 98.4 kg   SpO2 95%   BMI 32.99 kg/m     Wt Readings from Last 3 Encounters:  12/08/23 98.4 kg  10/17/23 93.9 kg  09/01/23 93.6 kg     GEN: Well nourished, well developed in no acute distress CARDIAC: RRR, no murmurs, rubs, gallops RESPIRATORY:  Normal work of breathing MUSCULOSKELETAL: no edema    ASSESSMENT & PLAN:     Persistent atrial fibrillation Symptomatic with shortness of breath, palpitations Has a history of tachycardia induced cardiomyopathy We discussed management options.  He would like to pursue rhythm control with ablation If he has recurrence prior to ablation, we will need to start amiodarone He has also had typical appearing atrial flutter on EKG in May 2024.  Will plan for CTI line at the time of ablation.  We discussed the indication, rationale, logistics, anticipated benefits, and potential risks of the ablation procedure including but not limited to -- bleed at the groin access site, chest pain, damage to nearby organs such as the diaphragm, lungs, or esophagus, need for a drainage tube, or prolonged hospitalization. I explained that the risk for stroke, heart attack, need  for open chest surgery, or even death is very low but not zero. he  expressed understanding and wishes to proceed.   Secondary hypercoagulable state Continue apixaban 5 BID  Pulmonary nodule Patient informed of finding on preprocedure CT and advised of the need for follow-up CT in 6 months.  Signed, Maurice Small, MD  12/08/2023 9:15 AM    [facility]

## 2023-12-08 NOTE — Progress Notes (Signed)
 Patient and wife was given discharge instructions. Both verbalized understanding.

## 2023-12-08 NOTE — Transfer of Care (Signed)
 Immediate Anesthesia Transfer of Care Note  Patient: Corey Watts.  Procedure(s) Performed: ATRIAL FIBRILLATION ABLATION  Patient Location: PACU and Cath Lab  Anesthesia Type:General  Level of Consciousness: drowsy  Airway & Oxygen Therapy: Patient Spontanous Breathing and Patient connected to face mask oxygen  Post-op Assessment: Report given to RN, Post -op Vital signs reviewed and stable, and    Post vital signs: Reviewed and stable  Last Vitals:  Vitals Value Taken Time  BP    Temp    Pulse 87 12/08/23 1219  Resp 24 12/08/23 1219  SpO2 98 % 12/08/23 1219  Vitals shown include unfiled device data.  Last Pain:  Vitals:   12/08/23 0823  TempSrc:   PainSc: 0-No pain         Complications:  Patient was complaining of not being able to breathe and not being able to move.  He stated he was having chest pain.  Dr Stephannie Peters called to bedside and Dr Margarita Grizzle was walking past PACU bay and asked to come in and help assess patient.  EKG done at bed side and normal.  Dr Nelly Laurence also called to bedside.  It was determined that patient was still recovering from the anesthesia.  Patient's VSS remained stable  He was able to move all extremities with encouragement

## 2023-12-08 NOTE — Discharge Instructions (Signed)

## 2023-12-08 NOTE — Anesthesia Postprocedure Evaluation (Signed)
 Anesthesia Post Note  Patient: Corey ISADORE Sr.  Procedure(s) Performed: ATRIAL FIBRILLATION ABLATION     Patient location during evaluation: PACU Anesthesia Type: General Level of consciousness: awake and alert Pain management: pain level controlled Vital Signs Assessment: post-procedure vital signs reviewed and stable Respiratory status: spontaneous breathing, nonlabored ventilation and respiratory function stable Cardiovascular status: blood pressure returned to baseline Postop Assessment: no apparent nausea or vomiting Anesthetic complications: no  There were no known notable events for this encounter.  Last Vitals:  Vitals:   12/08/23 1245 12/08/23 1252  BP: 118/79 109/71  Pulse: 79 74  Resp: (!) 22 12  Temp: 36.9 C   SpO2: 93% 97%    Last Pain:  Vitals:   12/08/23 1252  TempSrc:   PainSc: 0-No pain                 Shanda Howells

## 2023-12-09 ENCOUNTER — Telehealth (HOSPITAL_COMMUNITY): Payer: Self-pay

## 2023-12-09 MED FILL — Atropine Sulfate Soln Prefill Syr 1 MG/10ML (0.1 MG/ML): INTRAMUSCULAR | Qty: 10 | Status: AC

## 2023-12-09 NOTE — Telephone Encounter (Signed)
 Spoke with patient to complete post procedure follow up call.  Patient reports no complications with groin sites.   Instructions reviewed with patient:  Remove large bandage at puncture site after 24 hours. It is normal to have bruising, tenderness and a pea or marble sized lump/knot at the groin site which can take up to three months to resolve.  Get help right away if you notice sudden swelling at the puncture site.  Check your puncture site every day for signs of infection: fever, redness, swelling, pus drainage, warmth, foul odor or excessive pain. If this occurs, please call the office at [phone], to speak with the nurse. Get help right away if your puncture site is bleeding and the bleeding does not stop after applying firm pressure to the area.  You may continue to have skipped beats/ atrial fibrillation during the first several months after your procedure.  It is very important not to miss any doses of your blood thinner Eliquis. Patient restarted taking this medication on yesterday, 12/08/23.   You will follow up with the Afib clinic on 01/05/24 and follow up with the APP on 03/09/24.   Patient verbalized understanding to all instructions provided.

## 2024-01-05 ENCOUNTER — Ambulatory Visit (HOSPITAL_COMMUNITY)
Admit: 2024-01-05 | Discharge: 2024-01-05 | Disposition: A | Discharge status: Home / Self Care | Source: Ambulatory Visit | Attending: Physician Assistant | Admitting: Physician Assistant

## 2024-01-05 ENCOUNTER — Encounter (HOSPITAL_COMMUNITY): Payer: Self-pay | Admitting: Physician Assistant

## 2024-01-05 ENCOUNTER — Other Ambulatory Visit (HOSPITAL_COMMUNITY): Payer: Self-pay | Admitting: Physician Assistant

## 2024-01-05 VITALS — BP 104/82 | HR 85 | Ht 68.0 in | Wt 217.4 lb

## 2024-01-05 DIAGNOSIS — I25111 Atherosclerotic heart disease of native coronary artery with angina pectoris with documented spasm: Secondary | ICD-10-CM | POA: Insufficient documentation

## 2024-01-05 DIAGNOSIS — F1091 Alcohol use, unspecified, in remission: Secondary | ICD-10-CM | POA: Diagnosis not present

## 2024-01-05 DIAGNOSIS — D6869 Other thrombophilia: Secondary | ICD-10-CM | POA: Insufficient documentation

## 2024-01-05 DIAGNOSIS — E669 Obesity, unspecified: Secondary | ICD-10-CM | POA: Diagnosis not present

## 2024-01-05 DIAGNOSIS — I5022 Chronic systolic (congestive) heart failure: Secondary | ICD-10-CM | POA: Diagnosis not present

## 2024-01-05 DIAGNOSIS — I484 Atypical atrial flutter: Secondary | ICD-10-CM

## 2024-01-05 DIAGNOSIS — Z6833 Body mass index (BMI) 33.0-33.9, adult: Secondary | ICD-10-CM | POA: Diagnosis not present

## 2024-01-05 DIAGNOSIS — I4819 Other persistent atrial fibrillation: Secondary | ICD-10-CM | POA: Diagnosis not present

## 2024-01-05 DIAGNOSIS — I4891 Unspecified atrial fibrillation: Secondary | ICD-10-CM | POA: Diagnosis present

## 2024-01-05 DIAGNOSIS — Z7901 Long term (current) use of anticoagulants: Secondary | ICD-10-CM | POA: Insufficient documentation

## 2024-01-05 LAB — CBC
HCT: 44.9 % (ref 39.0–52.0)
Hemoglobin: 14.4 g/dL (ref 13.0–17.0)
MCH: 30.9 pg (ref 26.0–34.0)
MCHC: 32.1 g/dL (ref 30.0–36.0)
MCV: 96.4 fL (ref 80.0–100.0)
Platelets: 197 10*3/uL (ref 150–400)
RBC: 4.66 MIL/uL (ref 4.22–5.81)
RDW: 15.2 % (ref 11.5–15.5)
WBC: 6.1 10*3/uL (ref 4.0–10.5)
nRBC: 0 % (ref 0.0–0.2)

## 2024-01-05 LAB — BASIC METABOLIC PANEL WITH GFR
Anion gap: 9 (ref 5–15)
BUN: 11 mg/dL (ref 8–23)
CO2: 23 mmol/L (ref 22–32)
Calcium: 9.1 mg/dL (ref 8.9–10.3)
Chloride: 109 mmol/L (ref 98–111)
Creatinine, Ser: 1.08 mg/dL (ref 0.61–1.24)
GFR, Estimated: >60 mL/min (ref >60)
Glucose, Bld: 160 mg/dL — ABNORMAL HIGH (ref 70–99)
Potassium: 3.7 mmol/L (ref 3.5–5.1)
Sodium: 141 mmol/L (ref 135–145)

## 2024-01-05 NOTE — Progress Notes (Signed)
 Primary Care Physician: Limmie Ren, MD Referring Physician: Dr. Cecillia Cogan Sr. is a 68 y.o. male with a h/o moderate non obstructive CAD in 2006 complicated by coronary vasospasm, alcohol use, new onset  atrial fibrillation, and obesity, as well as new heart failure with reduced EF.  He was seen by Dr. Domingo Friend 08/16/22 with c/o of exertional dyspnea and was in afib with RVR. He had a cardioversion 12/15 which was successful. He is now in the afib clinic for f/u . EKG today shows SR. He states that he feels improved but still at times feels short of breath. EF by echo performed while  in afib with RVR was 30-35%. He has given up alcohol and lost 17 lbs over the last 2 weeks. Since giving up alcohol and weight loss, his wife states that he is no longer snoring or having apnea spells. He is complaint with anticoagulation. Minimal caffeine and no tobacco.   On follow up 01/22/23, he is in what appears to be atrial flutter with RVR 171 bpm. He admits to being in this abnormal rhythm since Monday. He is presyncopal and almost passed out earlier today. He can feel his heart beating rapidly and is short of breath with walking. Denies chest pain. He has not missed any doses of Eliquis  5 mg BID. The last time he has eaten today was around noon.   He notes he has not had any alcohol since December.   On follow up 01/30/23, he is in NSR. He is s/p ED evaluation after our office visit on 01/22/23. He underwent emergent cardioversion in the ED and was successfully converted to NSR. He notes he was very short of breath when out of rhythm and this has improved. He is still short of breath but it is better. He has not missed any doses of anticoagulation.  Follow up in the AF clinic 07/16/23. Patient reports that he went into afib on Monday and has been feeling much more fatigued and SOB. There were no specific triggers that he could identify. He had to cancel his consultation with EP to  discuss ablation due to an insurance change.  Follow up in Afib clinic, 07/29/23. He is currently in NSR. S/p successful DCCV on 07/21/23. He feels better in normal rhythm. No missed doses of Eliquis . He would still like to discuss ablation with EP.   Follow up 01/05/24. Patient returns for follow up for atrial fibrillation. He is s/p afib and flutter ablation with Dr Arlester Ladd on 12/08/23. He reports that he had done very well post ablation until this past Thursday he went out of rhythm with symptoms of fatigue and SOB on exertion. There were no specific triggers that he could identify. He denies any groin issues post ablation.    Today, he  denies symptoms of palpitations, chest pain, orthopnea, PND, lower extremity edema, dizziness, presyncope, syncope, snoring, daytime somnolence, bleeding, or neurologic sequela. The patient is tolerating medications without difficulties and is otherwise without complaint today.    Past Medical History:  Diagnosis Date   Acute hearing loss of left ear 03/12/2021   Allergy    Allergic rhinitis   Angina pectoris (HCC) 09/16/2004   Known coronary vasospasm -- treated with combination of long-acting nitrates and calcium  channel blockers; NTG when necessary   CAD (coronary artery disease) 04/16/2005   Mild obstructive ramus intermedius disease (30-50%) and 50-60% proximal   Chronic back pain    Stemming from prior lumbar  diskectomy   GERD (gastroesophageal reflux disease)    Hyperlipidemia    Hypertension    Obesity    Substance abuse (HCC)    Known chronic alcoholic - not interested in quitting (current as of 2014)    Current Outpatient Medications  Medication Sig Dispense Refill   acetaminophen  (TYLENOL ) 500 MG tablet Take 500 mg by mouth daily.     acetaminophen  (TYLENOL ) 650 MG CR tablet Take 650 mg by mouth at bedtime.     allopurinol  (ZYLOPRIM ) 100 MG tablet TAKE 1 TABLET(100 MG) BY MOUTH DAILY 90 tablet 2   amLODipine  (NORVASC ) 10 MG tablet TAKE 1  TABLET(10 MG) BY MOUTH DAILY 90 tablet 3   apixaban  (ELIQUIS ) 5 MG TABS tablet TAKE 1 TABLET(5 MG) BY MOUTH TWICE DAILY 180 tablet 3   atorvastatin  (LIPITOR) 80 MG tablet Take 1 tablet (80 mg total) by mouth daily. 90 tablet 3   busPIRone  (BUSPAR ) 30 MG tablet TAKE 1 TABLET(30 MG) BY MOUTH TWICE DAILY 180 tablet 3   cyclobenzaprine  (FLEXERIL ) 5 MG tablet Take 1 tablet (5 mg total) by mouth 3 (three) times daily as needed for muscle spasms. 45 tablet 1   empagliflozin  (JARDIANCE ) 10 MG TABS tablet Take 1 tablet (10 mg total) by mouth daily. 90 tablet 3   ezetimibe  (ZETIA ) 10 MG tablet Take 1 tablet (10 mg total) by mouth daily. 90 tablet 3   HYDROcodone -acetaminophen  (NORCO/VICODIN) 5-325 MG tablet Take 1 tablet by mouth 2 (two) times daily as needed. 60 tablet 0   isosorbide  mononitrate (IMDUR ) 60 MG 24 hr tablet Take 1.5 tablets (90 mg total) by mouth daily. 135 tablet 3   loratadine  (CLARITIN ) 10 MG tablet Take 10 mg by mouth daily.     losartan  (COZAAR ) 25 MG tablet TAKE 1 TABLET(25 MG) BY MOUTH DAILY 90 tablet 3   Multiple Vitamin (MULTIVITAMIN WITH MINERALS) TABS tablet Take 1 tablet by mouth daily.     naloxone  (NARCAN ) nasal spray 4 mg/0.1 mL To be used only in case of suspected opioid overdose 1 each 0   nitroGLYCERIN  (NITROSTAT ) 0.4 MG SL tablet PLACE 1 TABLET UNDER THE TONGUE IF NEEDED FOR CHEST PAIN EVERY 5 MINUTES FOR 3 DOSES. IF NO RELIEF AFTER 3RD DOSE CALL MD OR 911 25 tablet 0   omeprazole  (PRILOSEC) 20 MG capsule TAKE 1 CAPSULE(20 MG) BY MOUTH DAILY 90 capsule 2   OVER THE COUNTER MEDICATION Apply 1 Application topically at bedtime as needed (pain). CBD lotion     pregabalin  (LYRICA ) 150 MG capsule TAKE 1 CAPSULE(150 MG) BY MOUTH TWICE DAILY 60 capsule 2   metoprolol  succinate (TOPROL -XL) 50 MG 24 hr tablet Take 1 tablet (50 mg total) by mouth daily at 6 (six) AM. 30 tablet 2   No current facility-administered medications for this encounter.    ROS- All systems are reviewed and  negative except as per the HPI above  Physical Exam: Vitals:   01/05/24 0830  BP: 104/82  Pulse: 85  Weight: 98.6 kg  Height: 5\' 8"  (1.727 m)    Wt Readings from Last 3 Encounters:  01/05/24 98.6 kg  12/08/23 98.4 kg  10/17/23 93.9 kg   GEN: Well nourished, well developed in no acute distress CARDIAC: Irregularly irregular rate and rhythm, no murmurs, rubs, gallops RESPIRATORY:  Clear to auscultation without rales, wheezing or rhonchi  ABDOMEN: Soft, non-tender, non-distended EXTREMITIES:  No edema; No deformity    EKG today demonstrates Atypical atrial flutter with 4:1 block Vent. rate 85  BPM PR interval * ms QRS duration 92 ms QT/QTcB 370/440 ms   ECHO 01/03/23: IMPRESSIONS   1. Left ventricular ejection fraction by 3D volume is 62 %. The left  ventricle has no regional wall motion abnormalities.   2. Mild mitral valve regurgitation.   3. The inferior vena cava is normal in size with greater than 50%  respiratory variability, suggesting right atrial pressure of 3 mmHg.   Comparison(s): Prior images reviewed side by side. The left ventricular  function has improved.    CHA2DS2-VASc Score = 4  The patient's score is based upon: CHF History: 1 HTN History: 1 Diabetes History: 0 Stroke History: 0 Vascular Disease History: 1 Age Score: 1 Gender Score: 0       ASSESSMENT AND PLAN: Persistent Atrial Fibrillation/atrial flutter The patient's CHA2DS2-VASc score is 4, indicating a 4.8% annual risk of stroke.   S/p afib and flutter ablation 12/08/23 He is back in atrial flutter. We discussed rhythm control options, will plan for DCCV.  Continue Eliquis  5 mg BID with no missed doses for 3 months post ablation.  Continue Toprol  50 mg daily Check bmet/cbc  Secondary Hypercoagulable State (ICD10:  D68.69) The patient is at significant risk for stroke/thromboembolism based upon his CHA2DS2-VASc Score of 4.  Continue Apixaban  (Eliquis ). No bleeding issues.    HTN Stable on current regimen  CAD CAC score 439 No anginal symptoms Followed by Dr Paulita Boss   HFrecEF EF improved with SR and discontinuation of alcohol GDMT per primary cardiology team Fluid status appears stable today    Follow up in the AF clinic post DCCV.    Informed Consent   Shared Decision Making/Informed Consent The risks (stroke, cardiac arrhythmias rarely resulting in the need for a temporary or permanent pacemaker, skin irritation or burns and complications associated with conscious sedation including aspiration, arrhythmia, respiratory failure and death), benefits (restoration of normal sinus rhythm) and alternatives of a direct current cardioversion were explained in detail to Mr. Haggart and he agrees to proceed.       Myrtha Ates PA-C [facility] [address] [place], [place] [address] [phone]

## 2024-01-05 NOTE — Patient Instructions (Signed)
 Cardioversion scheduled for: Friday April 25th   - Arrive at the [employer] [facility]A" of [facility] ([address])  and check in with ADMITTING at 9:30am   - Do not eat or drink anything after midnight the night prior to your procedure.   - Take all your morning medication (except diabetic medications) with a sip of water prior to arrival.  - You will not be able to drive home after your procedure. Please ensure you have a responsible adult to drive you home. You will need someone with you for 24 hours post procedure.    - Do NOT miss any doses of your blood thinner - if you should miss a dose or take a dose more than 4 hours late -- please notify our office immediately.    - Expect to be in the procedural area approximately 2 hours.   - If you feel as if you go back into normal rhythm prior to scheduled cardioversion, please notify our office immediately.   If your procedure is canceled in the cardioversion suite you will be charged a cancellation fee.     Hold below medications 72 hours prior to scheduled procedure/anesthesia. Restart medication on the following day after scheduled procedure/anesthesia Empagliflozin  (Jardiance )       For those patients who have a scheduled procedure/anesthesia on the same day of the week as their dose, hold the medication on the day of surgery.  They can take their scheduled dose the week before.  **Patients on the above medications scheduled for elective procedures that have not held the medication for the appropriate amount of time are at risk of cancellation or change in the anesthetic plan.

## 2024-01-08 NOTE — Progress Notes (Signed)
 Spoke to patient and instructed them to come at 09:00  and to be NPO after 0000. Medications reviewed.   Confirmed that patient will have a ride home and someone to stay with them for 24 hours after the procedure.

## 2024-01-09 ENCOUNTER — Encounter (HOSPITAL_COMMUNITY): Payer: Self-pay

## 2024-01-09 ENCOUNTER — Ambulatory Visit (HOSPITAL_COMMUNITY)
Admission: RE | Admit: 2024-01-09 | Discharge: 2024-01-09 | Disposition: A | Discharge status: Home / Self Care | Attending: Cardiology | Admitting: Cardiology

## 2024-01-09 ENCOUNTER — Encounter (HOSPITAL_COMMUNITY): Admission: RE | Payer: Self-pay | Source: Home / Self Care

## 2024-01-09 DIAGNOSIS — I5022 Chronic systolic (congestive) heart failure: Secondary | ICD-10-CM | POA: Insufficient documentation

## 2024-01-09 DIAGNOSIS — Z539 Procedure and treatment not carried out, unspecified reason: Secondary | ICD-10-CM | POA: Diagnosis not present

## 2024-01-09 DIAGNOSIS — Z7901 Long term (current) use of anticoagulants: Secondary | ICD-10-CM | POA: Diagnosis not present

## 2024-01-09 DIAGNOSIS — D6869 Other thrombophilia: Secondary | ICD-10-CM | POA: Diagnosis not present

## 2024-01-09 DIAGNOSIS — I110 Hypertensive heart disease with heart failure: Secondary | ICD-10-CM | POA: Insufficient documentation

## 2024-01-09 DIAGNOSIS — I484 Atypical atrial flutter: Secondary | ICD-10-CM | POA: Diagnosis not present

## 2024-01-09 DIAGNOSIS — I4819 Other persistent atrial fibrillation: Secondary | ICD-10-CM | POA: Diagnosis not present

## 2024-01-09 DIAGNOSIS — I2510 Atherosclerotic heart disease of native coronary artery without angina pectoris: Secondary | ICD-10-CM | POA: Diagnosis not present

## 2024-01-09 SURGERY — CARDIOVERSION (CATH LAB)
Anesthesia: General

## 2024-01-09 NOTE — H&P (Signed)
 ATRIAL FIB OFFICE VISIT 01/05/2024 [facility]    [place], [place], [place] [facility] Atypical atrial flutter [facility]) +1 more Dx Referred by Limmie Ren, MD Reason for Visit   Additional Documentation  Vitals: BP 104/82   Pulse 85   Ht 5\' 8"  (1.727 m)   Wt 98.6 kg   BMI 33.06 kg/m   BSA 2.17 m  Flowsheets: NEWS,   MEWS Score,   ...(4 more)  Encounter Info: Billing Info,   History,   Allergies,   Detailed Report   All Notes   Progress Notes by Twana Gal, PA at 01/05/2024 9:00 AM  Author: Twana Gal, PA Author Type: Physician Assistant Filed: 01/05/2024  9:32 AM  Note Status: Signed Cosign: Cosign Not Required Date of Service: 01/05/2024  9:00 AM  Editor: Twana Gal, PA (Physician Assistant)             Expand All Collapse All    Primary Care Physician: Limmie Ren, MD Referring Physician: Dr. Cecillia Cogan Sr. is a 68 y.o. male with a h/o moderate non obstructive CAD in 2006 complicated by coronary vasospasm, alcohol use, new onset  atrial fibrillation, and obesity, as well as new heart failure with reduced EF.  He was seen by Dr. Domingo Friend 08/16/22 with c/o of exertional dyspnea and was in afib with RVR. He had a cardioversion 12/15 which was successful. He is now in the afib clinic for f/u . EKG today shows SR. He states that he feels improved but still at times feels short of breath. EF by echo performed while  in afib with RVR was 30-35%. He has given up alcohol and lost 17 lbs over the last 2 weeks. Since giving up alcohol and weight loss, his wife states that he is no longer snoring or having apnea spells. He is complaint with anticoagulation. Minimal caffeine and no tobacco.    On follow up 01/22/23, he is in what appears to be atrial flutter with RVR 171 bpm. He admits to being in this abnormal rhythm since Monday. He is presyncopal and almost passed out earlier today. He  can feel his heart beating rapidly and is short of breath with walking. Denies chest pain. He has not missed any doses of Eliquis  5 mg BID. The last time he has eaten today was around noon.    He notes he has not had any alcohol since December.    On follow up 01/30/23, he is in NSR. He is s/p ED evaluation after our office visit on 01/22/23. He underwent emergent cardioversion in the ED and was successfully converted to NSR. He notes he was very short of breath when out of rhythm and this has improved. He is still short of breath but it is better. He has not missed any doses of anticoagulation.   Follow up in the AF clinic 07/16/23. Patient reports that he went into afib on Monday and has been feeling much more fatigued and SOB. There were no specific triggers that he could identify. He had to cancel his consultation with EP to discuss ablation due to an insurance change.   Follow up in Afib clinic, 07/29/23. He is currently in NSR. S/p successful DCCV on 07/21/23. He feels better in normal rhythm. No missed doses of Eliquis . He would still like to discuss ablation with EP.    Follow up 01/05/24. Patient returns for follow up  for atrial fibrillation. He is s/p afib and flutter ablation with Dr Arlester Ladd on 12/08/23. He reports that he had done very well post ablation until this past Thursday he went out of rhythm with symptoms of fatigue and SOB on exertion. There were no specific triggers that he could identify. He denies any groin issues post ablation.     Today, he  denies symptoms of palpitations, chest pain, orthopnea, PND, lower extremity edema, dizziness, presyncope, syncope, snoring, daytime somnolence, bleeding, or neurologic sequela. The patient is tolerating medications without difficulties and is otherwise without complaint today.          Past Medical History:  Diagnosis Date   Acute hearing loss of left ear 03/12/2021   Allergy      Allergic rhinitis   Angina pectoris (HCC) 09/16/2004     Known coronary vasospasm -- treated with combination of long-acting nitrates and calcium  channel blockers; NTG when necessary   CAD (coronary artery disease) 04/16/2005    Mild obstructive ramus intermedius disease (30-50%) and 50-60% proximal   Chronic back pain      Stemming from prior lumbar diskectomy   GERD (gastroesophageal reflux disease)     Hyperlipidemia     Hypertension     Obesity     Substance abuse (HCC)      Known chronic alcoholic - not interested in quitting (current as of 2014)                Current Outpatient Medications  Medication Sig Dispense Refill   acetaminophen  (TYLENOL ) 500 MG tablet Take 500 mg by mouth daily.       acetaminophen  (TYLENOL ) 650 MG CR tablet Take 650 mg by mouth at bedtime.       allopurinol  (ZYLOPRIM ) 100 MG tablet TAKE 1 TABLET(100 MG) BY MOUTH DAILY 90 tablet 2   amLODipine  (NORVASC ) 10 MG tablet TAKE 1 TABLET(10 MG) BY MOUTH DAILY 90 tablet 3   apixaban  (ELIQUIS ) 5 MG TABS tablet TAKE 1 TABLET(5 MG) BY MOUTH TWICE DAILY 180 tablet 3   atorvastatin  (LIPITOR) 80 MG tablet Take 1 tablet (80 mg total) by mouth daily. 90 tablet 3   busPIRone  (BUSPAR ) 30 MG tablet TAKE 1 TABLET(30 MG) BY MOUTH TWICE DAILY 180 tablet 3   cyclobenzaprine  (FLEXERIL ) 5 MG tablet Take 1 tablet (5 mg total) by mouth 3 (three) times daily as needed for muscle spasms. 45 tablet 1   empagliflozin  (JARDIANCE ) 10 MG TABS tablet Take 1 tablet (10 mg total) by mouth daily. 90 tablet 3   ezetimibe  (ZETIA ) 10 MG tablet Take 1 tablet (10 mg total) by mouth daily. 90 tablet 3   HYDROcodone -acetaminophen  (NORCO/VICODIN) 5-325 MG tablet Take 1 tablet by mouth 2 (two) times daily as needed. 60 tablet 0   isosorbide  mononitrate (IMDUR ) 60 MG 24 hr tablet Take 1.5 tablets (90 mg total) by mouth daily. 135 tablet 3   loratadine  (CLARITIN ) 10 MG tablet Take 10 mg by mouth daily.       losartan  (COZAAR ) 25 MG tablet TAKE 1 TABLET(25 MG) BY MOUTH DAILY 90 tablet 3   Multiple Vitamin  (MULTIVITAMIN WITH MINERALS) TABS tablet Take 1 tablet by mouth daily.       naloxone  (NARCAN ) nasal spray 4 mg/0.1 mL To be used only in case of suspected opioid overdose 1 each 0   nitroGLYCERIN  (NITROSTAT ) 0.4 MG SL tablet PLACE 1 TABLET UNDER THE TONGUE IF NEEDED FOR CHEST PAIN EVERY 5 MINUTES FOR 3 DOSES. IF NO  RELIEF AFTER 3RD DOSE CALL MD OR 911 25 tablet 0   omeprazole  (PRILOSEC) 20 MG capsule TAKE 1 CAPSULE(20 MG) BY MOUTH DAILY 90 capsule 2   OVER THE COUNTER MEDICATION Apply 1 Application topically at bedtime as needed (pain). CBD lotion       pregabalin  (LYRICA ) 150 MG capsule TAKE 1 CAPSULE(150 MG) BY MOUTH TWICE DAILY 60 capsule 2   metoprolol  succinate (TOPROL -XL) 50 MG 24 hr tablet Take 1 tablet (50 mg total) by mouth daily at 6 (six) AM. 30 tablet 2      No current facility-administered medications for this encounter.        ROS- All systems are reviewed and negative except as per the HPI above   Physical Exam:    Vitals:    01/05/24 0830  BP: 104/82  Pulse: 85  Weight: 98.6 kg  Height: 5\' 8"  (1.727 m)         Wt Readings from Last 3 Encounters:  01/05/24 98.6 kg  12/08/23 98.4 kg  10/17/23 93.9 kg    GEN: Well nourished, well developed in no acute distress CARDIAC: Irregularly irregular rate and rhythm, no murmurs, rubs, gallops RESPIRATORY:  Clear to auscultation without rales, wheezing or rhonchi  ABDOMEN: Soft, non-tender, non-distended EXTREMITIES:  No edema; No deformity      EKG today demonstrates Atypical atrial flutter with 4:1 block Vent. rate 85 BPM PR interval * ms QRS duration 92 ms QT/QTcB 370/440 ms     ECHO 01/03/23: IMPRESSIONS   1. Left ventricular ejection fraction by 3D volume is 62 %. The left  ventricle has no regional wall motion abnormalities.   2. Mild mitral valve regurgitation.   3. The inferior vena cava is normal in size with greater than 50%  respiratory variability, suggesting right atrial pressure of 3 mmHg.    Comparison(s): Prior images reviewed side by side. The left ventricular  function has improved.      CHA2DS2-VASc Score = 4  The patient's score is based upon: CHF History: 1 HTN History: 1 Diabetes History: 0 Stroke History: 0 Vascular Disease History: 1 Age Score: 1 Gender Score: 0         ASSESSMENT AND PLAN: Persistent Atrial Fibrillation/atrial flutter The patient's CHA2DS2-VASc score is 4, indicating a 4.8% annual risk of stroke.   S/p afib and flutter ablation 12/08/23 He is back in atrial flutter. We discussed rhythm control options, will plan for DCCV.  Continue Eliquis  5 mg BID with no missed doses for 3 months post ablation.  Continue Toprol  50 mg daily Check bmet/cbc   Secondary Hypercoagulable State (ICD10:  D68.69) The patient is at significant risk for stroke/thromboembolism based upon his CHA2DS2-VASc Score of 4.  Continue Apixaban  (Eliquis ). No bleeding issues.    HTN Stable on current regimen   CAD CAC score 439 No anginal symptoms Followed by Dr Paulita Boss    HFrecEF EF improved with SR and discontinuation of alcohol GDMT per primary cardiology team Fluid status appears stable today       Follow up in the AF clinic post DCCV.      Informed Consent Shared Decision Making/Informed Consent The risks (stroke, cardiac arrhythmias rarely resulting in the need for a temporary or permanent pacemaker, skin irritation or burns and complications associated with conscious sedation including aspiration, arrhythmia, respiratory failure and death), benefits (restoration of normal sinus rhythm) and alternatives of a direct current cardioversion were explained in detail to Mr. Mandich and he agrees to proceed.  Myrtha Ates PA-C [facility] [address] [place], [place] [address] [phone]       Pt in sinus at time of arrival; procedure canceled; no change in meds [place] Angel

## 2024-01-16 ENCOUNTER — Ambulatory Visit (HOSPITAL_COMMUNITY): Admitting: Physician Assistant

## 2024-01-19 ENCOUNTER — Telehealth: Payer: Self-pay

## 2024-01-19 DIAGNOSIS — G8929 Other chronic pain: Secondary | ICD-10-CM

## 2024-01-19 MED ORDER — HYDROCODONE-ACETAMINOPHEN 5-325 MG PO TABS: 1.0000 | ORAL_TABLET | Freq: Two times a day (BID) | ORAL | 0 refills | Status: DC | PRN

## 2024-01-19 NOTE — Telephone Encounter (Signed)
 Corey Watts calls nurse line requesting a refill on pain medication.   She reports his back has been bothering him over the last several days.  Last fill 1/31.  Advised will forward to PCP.

## 2024-02-03 ENCOUNTER — Other Ambulatory Visit: Payer: Self-pay | Admitting: Student

## 2024-02-10 ENCOUNTER — Other Ambulatory Visit: Payer: Self-pay

## 2024-02-10 ENCOUNTER — Other Ambulatory Visit: Payer: Self-pay | Admitting: Student

## 2024-02-10 ENCOUNTER — Telehealth: Payer: Self-pay

## 2024-02-10 MED ORDER — PREGABALIN 150 MG PO CAPS: 150.0000 mg | ORAL_CAPSULE | Freq: Two times a day (BID) | ORAL | 2 refills | Status: DC

## 2024-02-10 NOTE — Telephone Encounter (Signed)
 Wife called and requested a refill on pregablin 150 mg, looks like he was last seen 08/08/2023 and was suppose to return in 4wks which he did not do. He has been getting refills with out being seen, please advise.

## 2024-02-10 NOTE — Telephone Encounter (Signed)
 Patient's wife calls nurse line requesting refill on Pregabalin .   Medication has been being prescribed by  [facility].   Wife is unsure if they are going to continue to fill due to him not being seen since last year.   Advised that I would forward message to PCP, however, we are usually unable to prescribe medications prescribed by other providers.   Patient also has follow up visit scheduled on 5/30.  Elsie Halo, RN

## 2024-02-13 ENCOUNTER — Ambulatory Visit (INDEPENDENT_AMBULATORY_CARE_PROVIDER_SITE_OTHER): Admitting: Student

## 2024-02-13 VITALS — BP 118/64 | HR 96 | Ht 67.0 in | Wt 216.2 lb

## 2024-02-13 DIAGNOSIS — M7741 Metatarsalgia, right foot: Secondary | ICD-10-CM | POA: Diagnosis not present

## 2024-02-13 DIAGNOSIS — G622 Polyneuropathy due to other toxic agents: Secondary | ICD-10-CM

## 2024-02-13 DIAGNOSIS — M7742 Metatarsalgia, left foot: Secondary | ICD-10-CM

## 2024-02-13 DIAGNOSIS — L299 Pruritus, unspecified: Secondary | ICD-10-CM

## 2024-02-13 MED ORDER — PREGABALIN 150 MG PO CAPS: 150.0000 mg | ORAL_CAPSULE | Freq: Two times a day (BID) | ORAL | 2 refills | Status: DC

## 2024-02-13 NOTE — Patient Instructions (Signed)
 Always great to see you! Let's refill your pregabalin . 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   Alexa Andrews, MD

## 2024-02-15 NOTE — Assessment & Plan Note (Signed)
 PDMP reviewed, it does appear that his PM&R physician already refilled his pregabalin .  Discussed that I would be happy to take this over in the future but no refill is needed at this time as it should be ready for him at the pharmacy.  I will leave it to him whether he elects to continue to follow with PM&R versus transferring management to [facility]. I do manage his narcotics for his chronic back pain, so it may make the most sense to consolidate management of controlled meds to our clinic.

## 2024-02-15 NOTE — Progress Notes (Signed)
    SUBJECTIVE:   CHIEF COMPLAINT / HPI:   Metatarsalgia  polyneuropathy Mr. Luu is requesting that I take over prescribing of his Lyrica  which he takes for bilateral metatarsalgia.  This was previously being prescribed by physical medicine and rehab, he called them yesterday but is unsure if they are going to continue prescribing.   Hand Irritation Tells me that he has had itchy puffy "knots" across the metatarsal pads of his hands on and off for many years now.  Aside from the itch they do not cause much trouble.  He has not noticed a pattern as to when they come and go or are worse/better.  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  He does not wear gloves at work.  He is set to retire in the coming months.    OBJECTIVE:   BP 118/64   Pulse 96   Ht 5\' 7"  (1.702 m)   Wt 216 lb 3.2 oz (98.1 kg)   SpO2 99%   BMI 33.86 kg/m   General: alert & oriented, no apparent distress, well groomed HEENT: normocephalic, atraumatic, EOM grossly intact, oral mucosa moist, neck supple Respiratory: normal respiratory effort GI: non-distended Psych: appropriate mood and affect Skin: Bilateral hands without rash.  There is mild prominence in, for lack of better work, "puffiness" of the metatarsal pads.  There is no underlying fluid collection or fluctuance.  His hands are neurovascularly intact with full range of motion.    ASSESSMENT/PLAN:   Assessment & Plan Metatarsalgia of both feet PDMP reviewed, it does appear that his PM&R physician already refilled his pregabalin .  Discussed that I would be happy to take this over in the future but no refill is needed at this time as it should be ready for him at the pharmacy.  I will leave it to him whether he elects to continue to follow with PM&R versus transferring management to [facility]. I do manage his narcotics for his chronic back pain, so it may make the most sense to consolidate management of controlled  meds to our clinic.  Itching of both hands I suspect that this is a contact dermatitis related to the cleaning chemicals that he uses at work.  Advised that he wear gloves at work.  Anticipate self resolving course with discontinuation of irritating factors.     Alexa Andrews, MD [facility]

## 2024-02-23 ENCOUNTER — Other Ambulatory Visit: Payer: Self-pay

## 2024-02-24 ENCOUNTER — Other Ambulatory Visit: Payer: Self-pay | Admitting: Student

## 2024-02-24 ENCOUNTER — Encounter: Payer: Self-pay | Admitting: *Deleted

## 2024-02-25 NOTE — Telephone Encounter (Signed)
 Wife returns call to nurse line regarding rx refill.   There are two pending orders for metoprolol  with different dosing directions.   Please advise.   Elsie Halo, RN

## 2024-03-08 NOTE — Progress Notes (Unsigned)
  Electrophysiology Office Note:   Date:  03/09/2024  ID:  Corey DELENA Sharps Sr., DOB 11/06/1955, MRN [id number]  Primary Cardiologist: Corey DELENA Leavens, MD Electrophysiologist: Corey FORBES Furbish, MD      History of Present Illness:   Corey Corey. is a 68 y.o. male with h/o moderate non obstructive CAD in 2006 complicated by coronary vasospasm, alcohol use, new onset  atrial fibrillation, and obesity seen today for routine electrophysiology follow-up s/p Ablation 12/08/2023.  Was seen in AF clinic 4/21 and noted be be in AFL. Scheduled for [facility] but was in sinus on arrival.   Since last being seen in our clinic the patient reports doing well. Push mowed the yard yesterday without undue SOB. Otherwise,  he denies chest pain, palpitations, dyspnea, PND, orthopnea, nausea, vomiting, dizziness, syncope, edema, weight gain, or early satiety.    Review of systems complete and found to be negative unless listed in HPI.   EP Information / Studies Reviewed:    EKG is ordered today. Personal review as below.  EKG Interpretation Date/Time:  Tuesday March 09 2024 08:20:33 EDT Ventricular Rate:  48 PR Interval:  124 QRS Duration:  96 QT Interval:  432 QTC Calculation: 385 R Axis:   35  Text Interpretation: Sinus bradycardia Nonspecific T wave abnormality When compared with ECG of 09-Jan-2024 08:40, ST no longer depressed in Lateral leads Inverted T waves have replaced nonspecific T wave abnormality in Anterior leads QT has shortened Confirmed by Corey Corey [phone]) on 03/09/2024 8:33:04 AM    Arrhythmia/Device History S/p PVI, posterior wall, and CTI with PFA 11/2023   Physical Exam:   VS:  BP 112/74   Pulse (!) 48   Ht 5' 7 (1.702 m)   Wt 222 lb (100.7 kg)   SpO2 92%   BMI 34.77 kg/m    Wt Readings from Last 3 Encounters:  03/09/24 222 lb (100.7 kg)  02/13/24 216 lb 3.2 oz (98.1 kg)  01/09/24 217 lb (98.4 kg)     GEN: No acute distress NECK: No JVD; No carotid  bruits CARDIAC: Regular rate and rhythm, no murmurs, rubs, gallops RESPIRATORY:  Clear to auscultation without rales, wheezing or rhonchi  ABDOMEN: Soft, non-tender, non-distended EXTREMITIES:  No edema; No deformity   ASSESSMENT AND PLAN:    Persistent AF Atrial flutter S/p ablation as above EKG today shows sinus bradycardia Continue Eliquis  5 mg BID for CHA2DS2VASc of at least 4  Secondary hypercoagulable state Pt on Eliquis  as above   HTN Stable on current regimen  CAD No s/s of ischemia.      HFrecEF EF improved with SR and discontinuation of alcohol    Follow up with EP Team in 6 months  Signed, Corey Prentice Lesia, PA-C

## 2024-03-09 ENCOUNTER — Ambulatory Visit: Attending: Student | Admitting: Student

## 2024-03-09 ENCOUNTER — Encounter: Payer: Self-pay | Admitting: Student

## 2024-03-09 VITALS — BP 112/74 | HR 48 | Ht 67.0 in | Wt 222.0 lb

## 2024-03-09 DIAGNOSIS — R911 Solitary pulmonary nodule: Secondary | ICD-10-CM

## 2024-03-09 DIAGNOSIS — I4819 Other persistent atrial fibrillation: Secondary | ICD-10-CM

## 2024-03-09 DIAGNOSIS — I2510 Atherosclerotic heart disease of native coronary artery without angina pectoris: Secondary | ICD-10-CM

## 2024-03-09 DIAGNOSIS — I484 Atypical atrial flutter: Secondary | ICD-10-CM

## 2024-03-09 DIAGNOSIS — D6869 Other thrombophilia: Secondary | ICD-10-CM

## 2024-03-09 NOTE — Patient Instructions (Signed)
 Medication Instructions:  Your physician recommends that you continue on your current medications as directed. Please refer to the Current Medication list given to you today.  *If you need a refill on your cardiac medications before your next appointment, please call your pharmacy*  Lab Work: None ordered If you have labs (blood work) drawn today and your tests are completely normal, you will receive your results only by: MyChart Message (if you have MyChart) OR A paper copy in the mail If you have any lab test that is abnormal or we need to change your treatment, we will call you to review the results.  Testing/Procedures: Your provider has recommended that you have a non contrast chest CT.  Follow-Up: At [facility], you and your health needs are our priority.  As part of our continuing mission to provide you with exceptional heart care, our providers are all part of one team.  This team includes your primary Cardiologist (physician) and Advanced Practice Providers or APPs (Physician Assistants and Nurse Practitioners) who all work together to provide you with the care you need, when you need it.  Your next appointment:   6 month(s)  Provider:   You may see Eulas FORBES Furbish, MD or one of the following Advanced Practice Providers on your designated Care Team:   Charlies Arthur, [place] Jodie Passey, PA-C Suzann Riddle, NP Daphne Barrack, NP

## 2024-03-10 ENCOUNTER — Other Ambulatory Visit: Payer: Self-pay

## 2024-03-10 DIAGNOSIS — M545 Low back pain, unspecified: Secondary | ICD-10-CM

## 2024-03-11 MED ORDER — HYDROCODONE-ACETAMINOPHEN 5-325 MG PO TABS
1.0000 | ORAL_TABLET | Freq: Two times a day (BID) | ORAL | 0 refills | Status: DC | PRN
Start: 2024-03-11 — End: 2024-05-27

## 2024-04-29 ENCOUNTER — Ambulatory Visit (INDEPENDENT_AMBULATORY_CARE_PROVIDER_SITE_OTHER)

## 2024-04-29 ENCOUNTER — Telehealth: Payer: Self-pay

## 2024-04-29 VITALS — Ht 72.0 in | Wt 222.0 lb

## 2024-04-29 DIAGNOSIS — Z0000 Encounter for general adult medical examination without abnormal findings: Secondary | ICD-10-CM

## 2024-04-29 NOTE — Telephone Encounter (Addendum)
 Patient would like to speak with office manager to see if its possible to switch to a Faculty Provider since he has been a patient for years now.  Patient would also like to same for his wife, Lyan Moyano, dob 08/23/1956.  Please advised.   Inge Waldroup N. Tomie, LPN [employer]

## 2024-04-29 NOTE — Patient Instructions (Signed)
 Corey Watts , Thank you for taking time out of your busy schedule to complete your Annual Wellness Visit with me. I enjoyed our conversation and look forward to speaking with you again next year. I, as well as your care team,  appreciate your ongoing commitment to your health goals. Please review the following plan we discussed and let me know if I can assist you in the future. Your Game plan/ To Do List    Referrals: If you haven't heard from the office you've been referred to, please reach out to them at the phone provided.   Follow up Visits: We will see or speak with you next year for your Next Medicare AWV with our clinical staff Have you seen your provider in the last 6 months (3 months if uncontrolled diabetes)? Yes  Clinician Recommendations:  Aim for 30 minutes of exercise or brisk walking, 6-8 glasses of water, and 5 servings of fruits and vegetables each day.       This is a list of the screenings recommended for you:  Health Maintenance  Topic Date Due   Zoster (Shingles) Vaccine (1 of 2) Never done   DTaP/Tdap/Td vaccine (3 - Td or Tdap) 06/30/2023   COVID-19 Vaccine (4 - Mixed Product risk 2024-25 season) 02/23/2024   Flu Shot  04/16/2024   Colon Cancer Screening  12/18/2024   Medicare Annual Wellness Visit  04/29/2025   Pneumococcal Vaccine for age over 58  Completed   Hepatitis C Screening  Completed   HPV Vaccine  Aged Out   Meningitis B Vaccine  Aged Out    Advanced directives: (Declined) Advance directive discussed with you today. Even though you declined this today, please call our office should you change your mind, and we can give you the proper paperwork for you to fill out. Advance Care Planning is important because it:  [x]  Makes sure you receive the medical care that is consistent with your values, goals, and preferences  [x]  It provides guidance to your family and loved ones and reduces their decisional burden about whether or not they are making the right  decisions based on your wishes.  Follow the link provided in your after visit summary or read over the paperwork we have mailed to you to help you started getting your Advance Directives in place. If you need assistance in completing these, please reach out to us  so that we can help you!  See attachments for Preventive Care and Fall Prevention Tips.

## 2024-04-29 NOTE — Progress Notes (Signed)
 Because this visit was a virtual/telehealth visit,  certain criteria was not obtained, such a blood pressure, CBG if applicable, and timed get up and go. Any medications not marked as taking were not mentioned during the medication reconciliation part of the visit. Any vitals not documented were not able to be obtained due to this being a telehealth visit or patient was unable to self-report a recent blood pressure reading due to a lack of equipment at home via telehealth. Vitals that have been documented are verbally provided by the patient.   Subjective:   Corey WALROND Sr. is a 68 y.o. who presents for a Medicare Wellness preventive visit.  As a reminder, Annual Wellness Visits don't include a physical exam, and some assessments may be limited, especially if this visit is performed virtually. We may recommend an in-person follow-up visit with your provider if needed.  Visit Complete: Virtual I connected with  Corey DELENA Sharps Sr. on 04/29/24 by a audio enabled telemedicine application and verified that I am speaking with the correct person using two identifiers.  Patient Location: Home  Provider Location: Home Office  I discussed the limitations of evaluation and management by telemedicine. The patient expressed understanding and agreed to proceed.  Vital Signs: Because this visit was a virtual/telehealth visit, some criteria may be missing or patient reported. Any vitals not documented were not able to be obtained and vitals that have been documented are patient reported.  VideoDeclined- This patient declined Librarian, academic. Therefore the visit was completed with audio only.  Persons Participating in Visit: Patient.  AWV Questionnaire: No: Patient Medicare AWV questionnaire was not completed prior to this visit.  Cardiac Risk Factors include: advanced age (>84men, >34 women);hypertension;dyslipidemia;male gender;obesity (BMI >30kg/m2)      Objective:    Today's Vitals   04/29/24 1532  Weight: 222 lb (100.7 kg)  Height: 6' (1.829 m)  PainSc: 0-No pain   Body mass index is 30.11 kg/m.     04/29/2024    3:34 PM 02/13/2024    8:34 AM 12/08/2023    8:23 AM 10/17/2023    8:23 AM 05/30/2023   10:32 AM 02/21/2023    9:03 AM 01/22/2023    4:11 PM  Advanced Directives  Does Patient Have a Medical Advance Directive? No No No No No No No  Would patient like information on creating a medical advance directive? No - Patient declined No - Patient declined No - Patient declined No - Patient declined No - Patient declined Yes (MAU/Ambulatory/Procedural Areas - Information given) No - Patient declined    Current Medications (verified) Outpatient Encounter Medications as of 04/29/2024  Medication Sig   acetaminophen  (TYLENOL ) 500 MG tablet Take 500 mg by mouth daily.   acetaminophen  (TYLENOL ) 650 MG CR tablet Take 650 mg by mouth at bedtime.   allopurinol  (ZYLOPRIM ) 100 MG tablet TAKE 1 TABLET(100 MG) BY MOUTH DAILY   amLODipine  (NORVASC ) 10 MG tablet TAKE 1 TABLET BY MOUTH EVERY DAY   apixaban  (ELIQUIS ) 5 MG TABS tablet TAKE 1 TABLET(5 MG) BY MOUTH TWICE DAILY   atorvastatin  (LIPITOR) 80 MG tablet Take 1 tablet (80 mg total) by mouth daily.   busPIRone  (BUSPAR ) 30 MG tablet TAKE 1 TABLET(30 MG) BY MOUTH TWICE DAILY   cyclobenzaprine  (FLEXERIL ) 5 MG tablet Take 1 tablet (5 mg total) by mouth 3 (three) times daily as needed for muscle spasms.   empagliflozin  (JARDIANCE ) 10 MG TABS tablet Take 1 tablet (10 mg total) by  mouth daily.   ezetimibe  (ZETIA ) 10 MG tablet Take 1 tablet (10 mg total) by mouth daily.   HYDROcodone -acetaminophen  (NORCO/VICODIN) 5-325 MG tablet Take 1 tablet by mouth 2 (two) times daily as needed.   isosorbide  mononitrate (IMDUR ) 60 MG 24 hr tablet Take 1.5 tablets (90 mg total) by mouth daily.   loratadine  (CLARITIN ) 10 MG tablet Take 10 mg by mouth daily.   losartan  (COZAAR ) 25 MG tablet TAKE 1 TABLET(25 MG) BY  MOUTH DAILY   metoprolol  succinate (TOPROL -XL) 50 MG 24 hr tablet TAKE 3 TABLETS(150 MG) BY MOUTH DAILY WITH OR IMMEDIATELY FOLLOWING A MEAL   Multiple Vitamin (MULTIVITAMIN WITH MINERALS) TABS tablet Take 1 tablet by mouth daily.   naloxone  (NARCAN ) nasal spray 4 mg/0.1 mL To be used only in case of suspected opioid overdose   nitroGLYCERIN  (NITROSTAT ) 0.4 MG SL tablet PLACE 1 TABLET UNDER THE TONGUE IF NEEDED FOR CHEST PAIN EVERY 5 MINUTES FOR 3 DOSES. IF NO RELIEF AFTER 3RD DOSE CALL MD OR 911   omeprazole  (PRILOSEC) 20 MG capsule TAKE 1 CAPSULE(20 MG) BY MOUTH DAILY   No facility-administered encounter medications on file as of 04/29/2024.    Allergies (verified) Diphenhydramine hcl, Other, and Zantac [ranitidine hcl]   History: Past Medical History:  Diagnosis Date   Acute hearing loss of left ear 03/12/2021   Allergy    Allergic rhinitis   Angina pectoris (HCC) 09/16/2004   Known coronary vasospasm -- treated with combination of long-acting nitrates and calcium  channel blockers; NTG when necessary   CAD (coronary artery disease) 04/16/2005   Mild obstructive ramus intermedius disease (30-50%) and 50-60% proximal   Chronic back pain    Stemming from prior lumbar diskectomy   GERD (gastroesophageal reflux disease)    Hyperlipidemia    Hypertension    Obesity    Substance abuse (HCC)    Known chronic alcoholic - not interested in quitting (current as of 2014)   Past Surgical History:  Procedure Laterality Date   ATRIAL FIBRILLATION ABLATION N/A 12/08/2023   Procedure: ATRIAL FIBRILLATION ABLATION;  Surgeon: Nancey Eulas BRAVO, MD;  Location: [facility] INVASIVE CV LAB;  Service: Cardiovascular;  Laterality: N/A;   BACK SURGERY  1989, 1997   Chronic back pain stemming from this   CARDIOVASCULAR STRESS TEST  2005   Dr. Victory Sharps -- Cardiolite stress test showed no evidence of ischemia   CARDIOVERSION N/A 08/30/2022   Procedure: CARDIOVERSION;  Surgeon: Pietro Redell RAMAN, MD;   Location: [facility] ENDOSCOPY;  Service: Cardiovascular;  Laterality: N/A;   CARDIOVERSION N/A 07/21/2023   Procedure: CARDIOVERSION (CATH LAB);  Surgeon: Kate Lonni CROME, MD;  Location: [facility] INVASIVE CV LAB;  Service: Cardiovascular;  Laterality: N/A;   COLONOSCOPY W/ BIOPSIES  2011   Multiple small polyps and s/p polypectomy -- recommended repeat in 2 years due to multiple small polyps   ESOPHAGEAL DILATION  1997   Family History  Problem Relation Age of Onset   Heart failure Mother    Heart failure Father    Heart disease Father    Social History   Socioeconomic History   Marital status: Married    Spouse name: Elveria   Number of children: 2   Years of education: 8   Highest education level: 8th grade  Occupational History   Occupation: Disabled   Tobacco Use   Smoking status: Former    Current packs/day: 2.00    Types: Cigarettes    Passive exposure: Past   Smokeless tobacco: Former  Quit date: 1991   Tobacco comments:    2PPD smoker until 1991  Vaping Use   Vaping status: Never Used  Substance and Sexual Activity   Alcohol use: Not Currently    Alcohol/week: 3.0 standard drinks of alcohol    Types: 3 Cans of beer per week    Comment: Quit etoh 08/16/2022 per patient   Drug use: Not Currently   Sexual activity: Yes  Other Topics Concern   Not on file  Social History Narrative   Patient lives with his wife Elveria.    Patient has 2 adult children, with 6 grandchildren, 1 great grandchild.    Patients family lives all on the same street.    Patient enjoys being outside, fixing up his home, and spending time with his family.    Social Drivers of Corporate investment banker Strain: Low Risk  (04/29/2024)   Overall Financial Resource Strain (CARDIA)    Difficulty of Paying Living Expenses: Not hard at all  Food Insecurity: No Food Insecurity (04/29/2024)   Hunger Vital Sign    Worried About Running Out of Food in the Last Year: Never true    Ran Out of Food in  the Last Year: Never true  Transportation Needs: No Transportation Needs (04/29/2024)   PRAPARE - Administrator, Civil Service (Medical): No    Lack of Transportation (Non-Medical): No  Physical Activity: Sufficiently Active (04/29/2024)   Exercise Vital Sign    Days of Exercise per Week: 5 days    Minutes of Exercise per Session: 30 min  Stress: No Stress Concern Present (04/29/2024)   [employer] of Occupational Health - Occupational Stress Questionnaire    Feeling of Stress: Not at all  Social Connections: Moderately Isolated (04/29/2024)   Social Connection and Isolation Panel    Frequency of Communication with Friends and Family: More than three times a week    Frequency of Social Gatherings with Friends and Family: Three times a week    Attends Religious Services: Never    Active Member of Clubs or Organizations: No    Attends Banker Meetings: Never    Marital Status: Married    Tobacco Counseling Counseling given: Not Answered Tobacco comments: 2PPD smoker until 1991    Clinical Intake:  Pre-visit preparation completed: Yes  Pain : No/denies pain Pain Score: 0-No pain     BMI - recorded: 30.11 Nutritional Status: BMI > 30  Obese Nutritional Risks: None Diabetes: No  Lab Results  Component Value Date   HGBA1C 6.2 (H) 05/30/2023   HGBA1C 5.9 07/19/2022   HGBA1C 6.2 (A) 12/04/2021     How often do you need to have someone help you when you read instructions, pamphlets, or other written materials from your doctor or pharmacy?: 1 - Never What is the last grade level you completed in school?: 8TH GRADE  Interpreter Needed?: No  Information entered by :: Sole Lengacher N. Chesni Vos, LPN.   Activities of Daily Living     04/29/2024    3:37 PM 07/21/2023    6:44 AM  In your present state of health, do you have any difficulty performing the following activities:  Hearing? 0 0  Vision? 0 0  Difficulty concentrating or making decisions? 0  0  Walking or climbing stairs? 0   Dressing or bathing? 0   Doing errands, shopping? 0   Preparing Food and eating ? N   Using the Toilet? N   In the past  six months, have you accidently leaked urine? N   Do you have problems with loss of bowel control? N   Managing your Medications? N   Managing your Finances? N   Housekeeping or managing your Housekeeping? N     Patient Care Team: Larraine Palma, MD as PCP - General (Family Medicine) Santo Stanly LABOR, MD as PCP - Cardiology (Cardiology) Mealor, Eulas BRAVO, MD as PCP - Electrophysiology (Cardiology)  I have updated your Care Teams any recent Medical Services you may have received from other providers in the past year.     Assessment:   This is a routine wellness examination for [place].  Hearing/Vision screen Hearing Screening - Comments:: Hearing loss of left ear.   Vision Screening - Comments:: No rx glasses - not up to date with routine eye exams.    Goals Addressed             This Visit's Progress    04/29/24: To maintain the best that I can and stay active around the house.         Depression Screen     04/29/2024    3:36 PM 02/13/2024    8:32 AM 10/17/2023    8:24 AM 08/08/2023   12:54 PM 05/30/2023   10:32 AM 02/21/2023    8:57 AM 12/10/2022    1:37 PM  PHQ 2/9 Scores  PHQ - 2 Score 0  0 0  0 0  PHQ- 9 Score 4  0 1   0  Exception Documentation  Patient refusal   Patient refusal      Fall Risk     04/29/2024    3:34 PM 02/13/2024    8:34 AM 08/08/2023   12:51 PM 05/30/2023   10:32 AM 02/21/2023    8:54 AM  Fall Risk   Falls in the past year? 0 0 0 0 0  Number falls in past yr: 0 0 0 0 0  Injury with Fall? 0  0 0 0  Risk for fall due to : No Fall Risks    No Fall Risks  Follow up Falls evaluation completed    Falls prevention discussed;Education provided;Falls evaluation completed    MEDICARE RISK AT HOME:  Medicare Risk at Home Any stairs in or around the home?: Yes (FRONT ENTRY/PORCH) If  so, are there any without handrails?: No Home free of loose throw rugs in walkways, pet beds, electrical cords, etc?: Yes Adequate lighting in your home to reduce risk of falls?: Yes Life alert?: No Use of a cane, walker or w/c?: No Grab bars in the bathroom?: Yes Shower chair or bench in shower?: Yes Elevated toilet seat or a handicapped toilet?: Yes  TIMED UP AND GO:  Was the test performed?  No  Cognitive Function: Declined/Normal: No cognitive concerns noted by patient or family. Patient alert, oriented, able to answer questions appropriately and recall recent events. No signs of memory loss or confusion.    04/29/2024    3:38 PM  MMSE - Mini Mental State Exam  Not completed: Unable to complete        04/29/2024    3:38 PM 02/21/2023    9:03 AM 11/16/2020    2:12 PM  6CIT Screen  What Year? 0 points 0 points 0 points  What month? 0 points 0 points 0 points  What time? 0 points 0 points 0 points  Count back from 20 0 points 0 points 0 points  Months in reverse 0 points 0  points 0 points  Repeat phrase 0 points 0 points   Total Score 0 points 0 points     Immunizations Immunization History  Administered Date(s) Administered   Fluad Trivalent(High Dose 65+) 05/30/2023   Influenza-Unspecified 06/03/2022   PFIZER(Purple Top)SARS-COV-2 Vaccination 04/02/2020, 04/23/2020   PNEUMOCOCCAL CONJUGATE-20 01/24/2022   Td 10/17/2002   Tdap 06/29/2013   Unspecified SARS-COV-2 Vaccination 08/25/2023    Screening Tests Health Maintenance  Topic Date Due   Zoster Vaccines- Shingrix (1 of 2) Never done   DTaP/Tdap/Td (3 - Td or Tdap) 06/30/2023   COVID-19 Vaccine (4 - Mixed Product risk 2024-25 season) 02/23/2024   INFLUENZA VACCINE  04/16/2024   Colonoscopy  12/18/2024   Medicare Annual Wellness (AWV)  04/29/2025   Pneumococcal Vaccine: 50+ Years  Completed   Hepatitis C Screening  Completed   HPV VACCINES  Aged Out   Meningococcal B Vaccine  Aged Out    Health  Maintenance  Health Maintenance Due  Topic Date Due   Zoster Vaccines- Shingrix (1 of 2) Never done   DTaP/Tdap/Td (3 - Td or Tdap) 06/30/2023   COVID-19 Vaccine (4 - Mixed Product risk 2024-25 season) 02/23/2024   INFLUENZA VACCINE  04/16/2024   Health Maintenance Items Addressed: Yes Patient aware of current heatj care gaps.  Patient is due for Covid-19, DTap, Flu and Shingrix vaccines.  Additional Screening:  Vision Screening: Recommended annual ophthalmology exams for early detection of glaucoma and other disorders of the eye. Would you like a referral to an eye doctor? No    Dental Screening: Recommended annual dental exams for proper oral hygiene  Community Resource Referral / Chronic Care Management: CRR required this visit?  No   CCM required this visit?  No   Plan:    I have personally reviewed and noted the following in the patient's chart:   Medical and social history Use of alcohol, tobacco or illicit drugs  Current medications and supplements including opioid prescriptions. Patient is currently taking opioid prescriptions. Information provided to patient regarding non-opioid alternatives. Patient advised to discuss non-opioid treatment plan with their provider. Functional ability and status Nutritional status Physical activity Advanced directives List of other physicians Hospitalizations, surgeries, and ER visits in previous 12 months Vitals Screenings to include cognitive, depression, and falls Referrals and appointments  In addition, I have reviewed and discussed with patient certain preventive protocols, quality metrics, and best practice recommendations. A written personalized care plan for preventive services as well as general preventive health recommendations were provided to patient.   Roz LOISE Fuller, LPN   1/85/7974   After Visit Summary: (MyChart) Due to this being a telephonic visit, the after visit summary with patients personalized plan  was offered to patient via MyChart   Notes: Patient aware of current heatj care gaps.  Patient is due for Covid-19, DTap, Flu and Shingrix vaccines.

## 2024-04-30 ENCOUNTER — Other Ambulatory Visit: Payer: Self-pay

## 2024-04-30 DIAGNOSIS — E782 Mixed hyperlipidemia: Secondary | ICD-10-CM

## 2024-04-30 MED ORDER — ATORVASTATIN CALCIUM 80 MG PO TABS: 80.0000 mg | ORAL_TABLET | Freq: Every day | ORAL | 3 refills | Status: AC

## 2024-05-03 ENCOUNTER — Other Ambulatory Visit: Payer: Self-pay | Admitting: Physician Assistant

## 2024-05-10 ENCOUNTER — Other Ambulatory Visit: Payer: Self-pay | Admitting: Physical Medicine & Rehabilitation

## 2024-05-11 ENCOUNTER — Other Ambulatory Visit: Payer: Self-pay | Admitting: Internal Medicine

## 2024-05-11 ENCOUNTER — Other Ambulatory Visit: Payer: Self-pay | Admitting: *Deleted

## 2024-05-11 DIAGNOSIS — I4891 Unspecified atrial fibrillation: Secondary | ICD-10-CM

## 2024-05-11 MED ORDER — APIXABAN 5 MG PO TABS: 5.0000 mg | ORAL_TABLET | Freq: Two times a day (BID) | ORAL | 3 refills | Status: AC

## 2024-05-13 ENCOUNTER — Telehealth: Payer: Self-pay

## 2024-05-13 DIAGNOSIS — M7741 Metatarsalgia, right foot: Secondary | ICD-10-CM

## 2024-05-13 MED ORDER — PREGABALIN 150 MG PO CAPS: 150.0000 mg | ORAL_CAPSULE | Freq: Two times a day (BID) | ORAL | 0 refills | Status: DC

## 2024-05-13 NOTE — Telephone Encounter (Signed)
 Patient's wife calls nurse line regarding pregabalin  150 mg prescription. This was previously being prescribed by Physical medicine and rehab. Wife reports that they will not refill until patient has an appointment, however, wife reports that they want him to do a treatment for neuropathy that will cost $2,000 and is not covered by insurance.  She is asking if our office can refill this as Dr. Marlee was prescribing in the past.   Will forward request to PCP. PCP follow up scheduled on 05/27/24.   [name], RN

## 2024-05-13 NOTE — Telephone Encounter (Signed)
 Sent in 2 weeks' worth of pregabalin  150 mg BID to cover him until his OV in September. PDMP reviewed.

## 2024-05-14 NOTE — Telephone Encounter (Signed)
 Called patient to inform of update.   Patient did not answer, VM left with update.   [name], RN

## 2024-05-25 NOTE — Progress Notes (Signed)
    SUBJECTIVE:   CHIEF COMPLAINT / HPI:   Corey Watts. is a 68 y.o. male presenting for medication management.  Bilateral Metatarsalgia of feet Taking Lyrica  for same. Also had been seeing PMNR. ***Hoping to switch prescribers to [facility] from [facility], as is not regularly seeing PMNR. Will need Lyrica  refill.  Healthcare Maintenance - Flu shot - Covid booster - Tdap booster - Shingles vaccine  PERTINENT  PMH / PSH: CAD, A fib, HTN  OBJECTIVE:   There were no vitals taken for this visit.  ***  ASSESSMENT/PLAN:   Assessment & Plan      Corey Flies, MD [facility]

## 2024-05-27 ENCOUNTER — Ambulatory Visit (INDEPENDENT_AMBULATORY_CARE_PROVIDER_SITE_OTHER)

## 2024-05-27 VITALS — BP 119/62 | HR 47 | Ht 69.0 in | Wt 225.8 lb

## 2024-05-27 DIAGNOSIS — M7742 Metatarsalgia, left foot: Secondary | ICD-10-CM | POA: Diagnosis not present

## 2024-05-27 DIAGNOSIS — M7741 Metatarsalgia, right foot: Secondary | ICD-10-CM | POA: Diagnosis not present

## 2024-05-27 DIAGNOSIS — G8929 Other chronic pain: Secondary | ICD-10-CM

## 2024-05-27 DIAGNOSIS — M545 Low back pain, unspecified: Secondary | ICD-10-CM

## 2024-05-27 DIAGNOSIS — Z23 Encounter for immunization: Secondary | ICD-10-CM

## 2024-05-27 MED ORDER — PREGABALIN 200 MG PO CAPS: 200.0000 mg | ORAL_CAPSULE | Freq: Two times a day (BID) | ORAL | 1 refills | Status: DC

## 2024-05-27 MED ORDER — HYDROCODONE-ACETAMINOPHEN 5-325 MG PO TABS: 1.0000 | ORAL_TABLET | Freq: Two times a day (BID) | ORAL | 0 refills | Status: DC | PRN

## 2024-05-27 NOTE — Assessment & Plan Note (Addendum)
 Patient with continued pain in bilateral feet occurring intermittently despite Lyrica . Has previously seen podiatry, PMNR.  - Increase Lyrica  to 200 mg twice daily, PDMP reviewed - Fu in 1 month; if still pain, start duloxetine 30 mg

## 2024-05-27 NOTE — Patient Instructions (Addendum)
 Dear Corey DELENA Sharps Sr.,   It was great seeing you in clinic today! You came in for medication refills and to meet your new PCP. I am refilling your hydrocodone . I am also increasing the dose of your Lyrica  to 200 mg twice a day to see if this helps with your foot pain.  We also gave you your flu shot today.  There are a few things for you to do outside of clinic: - Make a follow-up appointment in about a month to follow-up your foot pain and your medications  Thank you for allowing me to be a part of your care team! [name], MD [facility] [address] [place], [place], [place] [address] [phone]

## 2024-05-27 NOTE — Assessment & Plan Note (Signed)
-   Refilled hydrocodone ; PDMP reviewed

## 2024-05-28 ENCOUNTER — Telehealth: Payer: Self-pay | Admitting: Internal Medicine

## 2024-05-28 NOTE — Telephone Encounter (Signed)
 Left message to call back.

## 2024-05-28 NOTE — Telephone Encounter (Signed)
 Wife Marletta) wants a call back regarding patient's medications prior to test on Monday (9/15).

## 2024-05-31 ENCOUNTER — Encounter (HOSPITAL_COMMUNITY): Payer: Self-pay

## 2024-05-31 ENCOUNTER — Ambulatory Visit (HOSPITAL_COMMUNITY)
Admission: RE | Admit: 2024-05-31 | Discharge: 2024-05-31 | Disposition: A | Discharge status: Home / Self Care | Source: Ambulatory Visit | Attending: Student | Admitting: Student

## 2024-05-31 DIAGNOSIS — R911 Solitary pulmonary nodule: Secondary | ICD-10-CM | POA: Diagnosis not present

## 2024-05-31 DIAGNOSIS — R0602 Shortness of breath: Secondary | ICD-10-CM | POA: Diagnosis not present

## 2024-05-31 DIAGNOSIS — J439 Emphysema, unspecified: Secondary | ICD-10-CM | POA: Diagnosis not present

## 2024-05-31 NOTE — Telephone Encounter (Signed)
 Testing completed 05/31/24 at 8 am.

## 2024-06-07 ENCOUNTER — Ambulatory Visit: Payer: Self-pay | Admitting: Student

## 2024-06-15 NOTE — Assessment & Plan Note (Signed)
-   Duloxetine 30 mg daily

## 2024-06-15 NOTE — Progress Notes (Unsigned)
    SUBJECTIVE:   CHIEF COMPLAINT / HPI:   Corey Vi. is a 68 y.o. male presenting for follow-up of bilateral metatarsalgia.  Bilateral metatarsalgia Last seen for this in 05/2024.  Lyrica  increased to 200 mg twice daily at that time. Today, he reports bilateral foot pain which is 10/10. Increased Lyrica  didn't help. Has pain, also burning and pins and needs sensation. Did get shoe inserts, didn't help much.  Other Currently taking 150 mg metoprolol  daily (50 mg TID). Per chart review, was initially prescribed 50 mg daily by cardiology following ablation in the spring of this year.  Per patient, dose had been increased by Dr. Jimmy for elevated heart rate.  Heart rate low to low normal today.  Does report fatigue; no presyncope, dizziness, lightheadedness. Hx A fib with RVR; s/p ablation 12/08/2023.  Asking about Ativan for sleep.  Reports he sometimes has difficulty falling asleep, and will wake up in 2 to 3 hours and again have difficulty falling asleep.  Sleeps in a cool, dark room.  Wife does watch TV into the evenings in the same room.  Has not previously tried anything for sleep assistance.  Healthcare maintenance: - Tdap booster [per last visit, planning to get at outside pharmacy] - still needing booster, planning to get at pharmacy - COVID booster - planning to get at pharmacy  PERTINENT  PMH / PSH: HTN, A fib, HFimpEF, GERD, gout, prediabetes, HLD  OBJECTIVE:   BP 123/61   Pulse 67   Ht 5' 9 (1.753 m)   Wt 227 lb 6 oz (103.1 kg)   SpO2 95%   BMI 33.58 kg/m    Lower extremities: 2+ DP pulses bilaterally.  Pain to palpation diffusely over bilateral feet.  No gross or bony abnormality ladies.  Skin warm, dry.  Sensation decreased (pins-and-needles) bilaterally.  Grossly normal strength bilaterally.  ASSESSMENT/PLAN:   Assessment & Plan Metatarsalgia of both feet Not improved on increased Lyrica  dose.  Given patient's description of symptoms and physical  exam findings, believe additional medicine therapy could help. - Continue Lyrica  200 mg twice daily, Norco/Vicodin 5-325 mg twice daily as needed - Start duloxetine 30 mg daily - Patient counseled that this medication can take 1 to 2 months to fully take effect - If pain NOT well managed: consider increasing dose - If pain IS well managed: consider decreasing other medications - Will follow-up in 6 to 8 weeks to assess pain Paroxysmal atrial fibrillation (HCC) Status post ablation 11/2023.  Normal EF per echo done 12/2022. Fatigue could be related to poor sleep versus high metoprolol  dose. - Reduce metoprolol  from 150 mg daily to 100 mg daily - Patient counseled to return to 150 mg dose if he experience palpitations or tachycardia -Encouraged patient to try melatonin to help with sleep; also discussed good sleep hygiene    Corey Flies, MD [facility]

## 2024-06-17 ENCOUNTER — Ambulatory Visit

## 2024-06-17 VITALS — BP 123/61 | HR 67 | Ht 69.0 in | Wt 227.4 lb

## 2024-06-17 DIAGNOSIS — I480 Paroxysmal atrial fibrillation: Secondary | ICD-10-CM

## 2024-06-17 DIAGNOSIS — M7741 Metatarsalgia, right foot: Secondary | ICD-10-CM | POA: Diagnosis not present

## 2024-06-17 DIAGNOSIS — M7742 Metatarsalgia, left foot: Secondary | ICD-10-CM | POA: Diagnosis not present

## 2024-06-17 MED ORDER — METOPROLOL SUCCINATE ER 50 MG PO TB24: 100.0000 mg | ORAL_TABLET | Freq: Every day | ORAL | 0 refills | Status: AC

## 2024-06-17 MED ORDER — DULOXETINE HCL 30 MG PO CPEP: 30.0000 mg | ORAL_CAPSULE | Freq: Every day | ORAL | 0 refills | Status: DC

## 2024-06-17 NOTE — Assessment & Plan Note (Addendum)
 Status post ablation 11/2023.  Normal EF per echo done 12/2022. Fatigue could be related to poor sleep versus high metoprolol  dose. - Reduce metoprolol  from 150 mg daily to 100 mg daily - Patient counseled to return to 150 mg dose if he experience palpitations or tachycardia -Encouraged patient to try melatonin to help with sleep; also discussed good sleep hygiene

## 2024-06-17 NOTE — Patient Instructions (Addendum)
 Corey DELENA Sharps Sr.,   It was great seeing you in clinic today! You came in to follow up the pain in your feet. Since the increased Lyrica  dose didn't help, I am going to start you on an additional medication called Duloxetine; I am starting this at 30 mg once a day. Please note that it can take up to a month or two for this medication to have an effect.  There are a few things for you to do outside of clinic: - Follow-up in 6-8 weeks to see how the Duloxetine is working - You can take melatonin at night to help with sleep - I am decreasing your metoprolol  to 100 mg in a day; you can take this all at once or split the dose for 50 mg in the morning and 50 mg at night [you do not have to pick up the new dose from your pharmacy] If your heart starts beating fast or you get palpitations, return to your 150 mg in a day dose  Please bring all your medications to your next office visit.  Thank you for allowing me to be a part of your care team! [name], MD [facility] [address] [place], [place], [place] [address] [phone]

## 2024-06-22 ENCOUNTER — Telehealth: Payer: Self-pay

## 2024-06-22 NOTE — Telephone Encounter (Signed)
 Patient's wife calls nurse line regarding dosage of Pregabalin .   Wife reports that since patient started 200 mg BID, he has been very drowsy throughout the day. Drowsiness has been his only symptom.   They are asking if he can cut back to 1 capsule at bedtime.   They were unsure if this needed to be decreased gradually or if he could just stop BID dosing.   Will forward to PCP for further advisement.   [name], RN

## 2024-06-24 NOTE — Telephone Encounter (Signed)
 Called and provided update to patient's wife.   She reports that she will call next week to schedule follow up appointment.   [name], RN

## 2024-07-06 ENCOUNTER — Other Ambulatory Visit: Payer: Self-pay

## 2024-07-06 MED ORDER — ISOSORBIDE MONONITRATE ER 60 MG PO TB24: 90.0000 mg | ORAL_TABLET | Freq: Every day | ORAL | 3 refills | Status: AC

## 2024-07-16 ENCOUNTER — Other Ambulatory Visit: Payer: Self-pay | Admitting: *Deleted

## 2024-07-16 MED ORDER — BUSPIRONE HCL 30 MG PO TABS: 30.0000 mg | ORAL_TABLET | Freq: Two times a day (BID) | ORAL | 3 refills | Status: AC

## 2024-07-28 ENCOUNTER — Other Ambulatory Visit: Payer: Self-pay | Admitting: *Deleted

## 2024-07-28 DIAGNOSIS — M10061 Idiopathic gout, right knee: Secondary | ICD-10-CM

## 2024-07-28 MED ORDER — OMEPRAZOLE 20 MG PO CPDR: DELAYED_RELEASE_CAPSULE | ORAL | 2 refills | Status: AC

## 2024-07-28 MED ORDER — ALLOPURINOL 100 MG PO TABS
ORAL_TABLET | ORAL | 2 refills | Status: AC
Start: 2024-07-28 — End: ?

## 2024-08-02 ENCOUNTER — Encounter: Payer: Self-pay | Admitting: Family Medicine

## 2024-08-02 ENCOUNTER — Ambulatory Visit (INDEPENDENT_AMBULATORY_CARE_PROVIDER_SITE_OTHER): Admitting: Family Medicine

## 2024-08-02 VITALS — BP 111/64 | HR 53 | Ht 69.0 in | Wt 220.8 lb

## 2024-08-02 DIAGNOSIS — I480 Paroxysmal atrial fibrillation: Secondary | ICD-10-CM | POA: Diagnosis not present

## 2024-08-02 DIAGNOSIS — M545 Low back pain, unspecified: Secondary | ICD-10-CM | POA: Diagnosis not present

## 2024-08-02 DIAGNOSIS — M79672 Pain in left foot: Secondary | ICD-10-CM

## 2024-08-02 DIAGNOSIS — G8929 Other chronic pain: Secondary | ICD-10-CM

## 2024-08-02 DIAGNOSIS — M79671 Pain in right foot: Secondary | ICD-10-CM | POA: Diagnosis not present

## 2024-08-02 MED ORDER — HYDROCODONE-ACETAMINOPHEN 5-325 MG PO TABS: 1.0000 | ORAL_TABLET | Freq: Two times a day (BID) | ORAL | 0 refills | Status: AC | PRN

## 2024-08-02 MED ORDER — LOSARTAN POTASSIUM 25 MG PO TABS: 25.0000 mg | ORAL_TABLET | Freq: Every day | ORAL | 3 refills | Status: AC

## 2024-08-02 NOTE — Progress Notes (Signed)
    SUBJECTIVE:   CHIEF COMPLAINT / HPI:    Discussed the use of AI scribe software for clinical note transcription with the patient, who gave verbal consent to proceed.  History of Present Illness Corey Watts. is a 68 year old male with atrial fibrillation and chronic foot pain who presents with medication side effects and recurrent atrial fibrillation episodes.  Paroxysmal atrial fibrillation - Recurrent episodes , most recently last Thursday and Friday - During episodes, experiences weakness, fatigue, and shortness of breath, sometimes unable to walk short distances - Heart rate during episodes reaches up to 157 bpm - Chest pain and shortness of breath present during episodes - No significant dyspnea outside of episodes - These are stable since his ablation. He has appointment with Cardiology and they are aware of his symptoms.   Chronic foot pain - Persistent burning pain affecting both the top and bottom of feet - Pain managed with regular physical activity, exercise, and stretching  Medication side effects and management - Pregabalin  dose reduced to 200 mg nightly due to excessive drowsiness with twice-daily dosing - Duloxetine discontinued due to ineffectiveness and sexual side effects, including decreased arousal, difficulty maintaining erection, and inability to orgasm - He has tried paroxetine in the past and had similar sexual side effects. - Toprolol dose reduced to twice daily due to low pulse rates  - Daily home blood pressure monitoring   PERTINENT  PMH / PSH: CHF, HTN, Afib, GERD, chronic foot pain  OBJECTIVE:   BP 111/64   Pulse (!) 53   Ht 5' 9 (1.753 m)   Wt 220 lb 12.8 oz (100.2 kg)   SpO2 92%   BMI 32.61 kg/m   General: Alert, pleasant man. NAD. HEENT: NCAT. MMM. CV: RRR, no murmurs. Cap refill <2. Resp: CTAB, no wheezing or crackles. Normal WOB on RA.  Abm: Soft, nontender, nondistended. BS present. Ext: Moves all ext spontaneously Skin:  Warm, well perfused   ASSESSMENT/PLAN:   Assessment & Plan Foot pain, bilateral Chronic. Will stop duloxetine due to sexual side effects and limited benefit. Is on vicodin, flexeril , tylenol , lyrica . Discussed other lifestyle modifications including regular exercise, stretching, and foot insoles.  - Lyrica  200mg  nightly to limit drowsiness during day - Stop duloxetine. - Counseled aerobic exercise daily - Counseled foot stretching for plantar fasciitis component - Counseled on foot insoles - f/u in 1 month Paroxysmal atrial fibrillation (HCC) Stable, VSS today. Advised to f/u with Cardiology. Discussed return precautions. - Cont metoprolol       Twyla Nearing, MD [facility]

## 2024-08-02 NOTE — Assessment & Plan Note (Signed)
 Stable, VSS today. Advised to f/u with Cardiology. Discussed return precautions. - Cont metoprolol 

## 2024-08-02 NOTE — Patient Instructions (Addendum)
 1) Let's stop your duloxetine. You should notice your side effects improving over the next week. Some people may feel withdrawals, but you were on a relatively low dose so it is less likely.   2) Lets try to optimize your foot pain control with other methods.  - Make sure you are getting at least 30 minutes of exercise a day. This can be walking, jogging, biking. Anything that gets blood flow to your feet. - I also recommend stretching your feet for about 10 minutes every morning and night. - You can wear a shoe insole that provides a little more support to your feet.   3) Come back in the next month to see how your pain is doing.

## 2024-08-09 ENCOUNTER — Other Ambulatory Visit: Payer: Self-pay | Admitting: *Deleted

## 2024-08-09 DIAGNOSIS — I5020 Unspecified systolic (congestive) heart failure: Secondary | ICD-10-CM

## 2024-08-09 MED ORDER — EMPAGLIFLOZIN 10 MG PO TABS: 10.0000 mg | ORAL_TABLET | Freq: Every day | ORAL | 3 refills | Status: AC

## 2024-08-18 ENCOUNTER — Other Ambulatory Visit: Payer: Self-pay

## 2024-08-18 DIAGNOSIS — M7742 Metatarsalgia, left foot: Secondary | ICD-10-CM

## 2024-08-18 MED ORDER — PREGABALIN 200 MG PO CAPS: 200.0000 mg | ORAL_CAPSULE | Freq: Every evening | ORAL | 3 refills | Status: DC

## 2024-08-19 ENCOUNTER — Telehealth: Payer: Self-pay

## 2024-08-19 DIAGNOSIS — M7741 Metatarsalgia, right foot: Secondary | ICD-10-CM

## 2024-08-19 NOTE — Telephone Encounter (Signed)
 [facility] LVM on nurse line in regards to Pregabalin  prescription.   They report [place] DEA is not going through.   I attempted to call [facility] on [address] to discuss DEA issue, however the automated message stated, this store is currently closed.   Will attempt to call back at a later time.

## 2024-08-20 NOTE — Telephone Encounter (Signed)
 Called pharmacy.   They were able to resolve issue. Medication will be ready in the next 20 minutes.   Called wife. She said that they will pick up on Monday as they have another appointment this day.   [name], RN

## 2024-08-20 NOTE — Telephone Encounter (Signed)
 Received returned call from patient's wife stating that pharmacist called and there is still an issue.   Called back to pharmacy. Pharmacist states that insurance rejected override that they use for residents and they are needing attending to resend.   Cancelled existing rx and pended for medication to be resent by PM preceptor.   Forwarding to Dr. Rosalynn.   [name], RN

## 2024-08-20 NOTE — Addendum Note (Signed)
 Addended by: Cedric Mcclaine C on: 08/20/2024 04:56 PM   Modules accepted: Orders

## 2024-08-23 ENCOUNTER — Other Ambulatory Visit: Payer: Self-pay

## 2024-08-23 DIAGNOSIS — M7741 Metatarsalgia, right foot: Secondary | ICD-10-CM

## 2024-08-23 MED ORDER — PREGABALIN 200 MG PO CAPS: 200.0000 mg | ORAL_CAPSULE | Freq: Every evening | ORAL | 3 refills | Status: AC

## 2024-08-23 NOTE — Telephone Encounter (Signed)
 Duloxetine  discontinued on 08/02/24 by Dr. Twyla Nearing given side effects and lack of effectiveness; will not refill.

## 2024-08-23 NOTE — Addendum Note (Signed)
 Addended byBETHA ROSALYNN CREDIT L on: 08/23/2024 01:24 PM   Modules accepted: Orders

## 2024-08-26 NOTE — Progress Notes (Unsigned)
 Cardiology Office Note:  .   Date:  08/26/2024  ID:  Corey DELENA Sharps Sr., DOB [birth year], MRN [id number] PCP: Larraine Palma, MD  [facility] Providers Cardiologist:  Stanly DELENA Leavens, MD Electrophysiologist:  Eulas FORBES Furbish, MD {  History of Present Illness: Corey   Corey CHISMAR Sr. is a 68 y.o. male w/PMHx of  HTN, HLD, ETOH hx CAD (non-obstructive cath 2006, + vasospasm) AFib NICM, suspect tachy-mediated/ETOH> recovered  AFib ablation 12/08/23  He saw AFib clinic 01/05/24, did well, though a few days prior to the visit, went into afib, symptomstic with SOB, fatigue. Planned for DCCV  Arrived to CV in SR  Saw Jodie 03/09/24, doing well, push mowing his yard, no changes made, maintaining SR  Following with his PMD team, struggling with pain, sleep. ? Mention of recent AFib as well.   Today's visit is scheduled as a 6 mo visit ROS:   He comes today accompanied by his wife Since his ablation he has had 2 episodes of AFib, last 1-2 days each.  This is a marked improvement His AFib is fast and very symptomatic with chest discomfort, weakness, dizziness, SOB He is quite happy with the result of his ablation When not in AFib he feels quite well Has been told of emphysema, does reports longstanding DOE that is unchanged and not limiting.   He works a physically demanding job, cleans offices/homes, and reports very good exertional capacity No CP when not in AFib No rest SOB No near syncope or syncope No bleeding or signs of bleeding    Arrhythmia/AAD hx AFib, flutter 2023 AFib/CTI ablation 12/08/23  Studies Reviewed: Corey    EKG not done today   12/08/23: EPS/ablation CONCLUSIONS: 1. Atrial fibrillation upon presentation.   2. Successful ablation of all four pulmonary veins with pulsed field energy. 3. Successful ablation of the posterior wall of the left atrium with pulsed field energy 4.  DC cardioversion 5.  Ablation of the cavotricuspid isthmus for  typical atrial flutter 6. No inducible arrhythmias following ablation  7. No early apparent complications.   01/03/23: TTE 1. Left ventricular ejection fraction by 3D volume is 62 %. The left  ventricle has no regional wall motion abnormalities.   2. Mild mitral valve regurgitation.   3. The inferior vena cava is normal in size with greater than 50%  respiratory variability, suggesting right atrial pressure of 3 mmHg.    Risk Assessment/Calculations:    Physical Exam:   VS:  There were no vitals taken for this visit.   Wt Readings from Last 3 Encounters:  08/02/24 220 lb 12.8 oz (100.2 kg)  06/17/24 227 lb 6 oz (103.1 kg)  05/27/24 225 lb 12.8 oz (102.4 kg)    GEN: Well nourished, well developed in no acute distress NECK: No JVD; No carotid bruits CARDIAC: RRR, no murmurs, rubs, gallops RESPIRATORY:  CTA b/l without rales, wheezing or rhonchi  ABDOMEN: Soft, non-tender, non-distended EXTREMITIES: No edema; No deformity   ASSESSMENT AND PLAN: .    persistent AFib AFlutter  CHA2DS2Vasc is 3, on Eliquis , appropriately dosed low burden by symptoms  Sees his PMD Q45mo has labs regularly  Secondary hypercoagulable state 2/2 AFib  4.  Emphysema O2 sats today 90-93%, moving air well, no wheezing He reports same at home, reports no formal pulmonary evaluation Recommended that he discuss this diagnosis further with his PMD and any formal/further evaluation, management needed   Dispo: back with EP again in a year, sooner  if needed, he is due to see Dr. Cinthia.  Signed, Charlies Macario Arthur, PA-C

## 2024-08-27 ENCOUNTER — Ambulatory Visit: Attending: Student | Admitting: Physician Assistant

## 2024-08-27 VITALS — BP 124/68 | HR 62 | Ht 69.0 in | Wt 224.0 lb

## 2024-08-27 DIAGNOSIS — I4819 Other persistent atrial fibrillation: Secondary | ICD-10-CM

## 2024-08-27 DIAGNOSIS — I484 Atypical atrial flutter: Secondary | ICD-10-CM

## 2024-08-27 DIAGNOSIS — D6869 Other thrombophilia: Secondary | ICD-10-CM

## 2024-08-27 NOTE — Patient Instructions (Signed)
 Medication Instructions:   Your physician recommends that you continue on your current medications as directed. Please refer to the Current Medication list given to you today.  *If you need a refill on your cardiac medications before your next appointment, please call your pharmacy*   Lab Work: NONE ORDERED  TODAY    If you have labs (blood work) drawn today and your tests are completely normal, you will receive your results only by: MyChart Message (if you have MyChart) OR A paper copy in the mail If you have any lab test that is abnormal or we need to change your treatment, we will call you to review the results.  Testing/Procedures:  NONE ORDERED  TODAY    Follow-Up: At [facility], you and your health needs are our priority.  As part of our continuing mission to provide you with exceptional heart care, our providers are all part of one team.  This team includes your primary Cardiologist (physician) and Advanced Practice Providers or APPs (Physician Assistants and Nurse Practitioners) who all work together to provide you with the care you need, when you need it.  Your next appointment:   NEXT AVAILABLE  DR LUCYANN   1 year(s)Provider:   Eulas Furbish, MD, Daphne Barrack, NP, Ozell Heck [place], PA-C, or Charlies Arthur, PA-C     We recommend signing up for the patient portal called MyChart.  Sign up information is provided on this After Visit Summary.  MyChart is used to connect with patients for Virtual Visits (Telemedicine).  Patients are able to view lab/test results, encounter notes, upcoming appointments, etc.  Non-urgent messages can be sent to your provider as well.   To learn more about what you can do with MyChart, go to [email or web address].   Other Instructions

## 2024-09-24 ENCOUNTER — Other Ambulatory Visit (HOSPITAL_COMMUNITY): Payer: Self-pay

## 2024-09-24 ENCOUNTER — Telehealth (HOSPITAL_COMMUNITY): Payer: Self-pay

## 2024-09-24 ENCOUNTER — Ambulatory Visit (HOSPITAL_COMMUNITY)
Admission: RE | Admit: 2024-09-24 | Discharge: 2024-09-24 | Disposition: A | Source: Ambulatory Visit | Attending: Physician Assistant | Admitting: Physician Assistant

## 2024-09-24 VITALS — BP 98/62 | HR 104 | Ht 69.0 in | Wt 227.5 lb

## 2024-09-24 DIAGNOSIS — I484 Atypical atrial flutter: Secondary | ICD-10-CM

## 2024-09-24 DIAGNOSIS — D6869 Other thrombophilia: Secondary | ICD-10-CM | POA: Diagnosis not present

## 2024-09-24 DIAGNOSIS — I4819 Other persistent atrial fibrillation: Secondary | ICD-10-CM

## 2024-09-24 DIAGNOSIS — I4891 Unspecified atrial fibrillation: Secondary | ICD-10-CM | POA: Diagnosis not present

## 2024-09-24 MED ORDER — DRONEDARONE HCL 400 MG PO TABS: 400.0000 mg | ORAL_TABLET | Freq: Two times a day (BID) | ORAL | 3 refills | Status: DC

## 2024-09-24 NOTE — Progress Notes (Signed)
 "  Primary Care Physician: Larraine Palma, MD Referring Physician: Dr. Emilee Ozell Corey [name] is a 69 y.o. male with a h/o moderate non obstructive CAD in 2006 complicated by coronary vasospasm, alcohol use, new onset  atrial fibrillation, and obesity, as well as new heart failure with reduced EF.  He was seen by Dr. Emilee 08/16/22 with c/o of exertional dyspnea and was in afib with RVR. He had a cardioversion 12/15 which was successful. He is now in the afib clinic for f/u . EKG today shows SR. He states that he feels improved but still at times feels short of breath. EF by echo performed while  in afib with RVR was 30-35%. He has given up alcohol and lost 17 lbs over the last 2 weeks. Since giving up alcohol and weight loss, his wife states that he is no longer snoring or having apnea spells. He is complaint with anticoagulation. Minimal caffeine and no tobacco.   On follow up 01/22/23, he is in what appears to be atrial flutter with RVR 171 bpm. He admits to being in this abnormal rhythm since Monday. He is presyncopal and almost passed out earlier today. He can feel his heart beating rapidly and is short of breath with walking. Denies chest pain. He has not missed any doses of Eliquis  5 mg BID. The last time he has eaten today was around noon.   He notes he has not had any alcohol since December.   On follow up 01/30/23, he is in NSR. He is s/p ED evaluation after our office visit on 01/22/23. He underwent emergent cardioversion in the ED and was successfully converted to NSR. He notes he was very short of breath when out of rhythm and this has improved. He is still short of breath but it is better. He has not missed any doses of anticoagulation.  Follow up in the AF clinic 07/16/23. Patient reports that he went into afib on Monday and has been feeling much more fatigued and SOB. There were no specific triggers that he could identify. He had to cancel his consultation with EP to  discuss ablation due to an insurance change.  Follow up in Afib clinic, 07/29/23. He is currently in NSR. S/p successful DCCV on 07/21/23. He feels better in normal rhythm. No missed doses of Eliquis . He would still like to discuss ablation with EP.   Follow up 01/05/24. Patient returns for follow up for atrial fibrillation. He is s/p afib and flutter ablation with Dr Nancey on 12/08/23. He reports that he had done very well post ablation until this past Thursday he went out of rhythm with symptoms of fatigue and SOB on exertion. There were no specific triggers that he could identify. He denies any groin issues post ablation.    Follow up 09/24/24. Patient returns for follow up for atrial fibrillation and atrial flutter. He has symptoms of fatigue and palpitations. He reports that he went back into atrial flutter about one week ago. He did have an URI at the time but there were no other specific triggers. Prior to this episode, he has had several shorter episodes of afib/flutter lasting 1-2 days. No bleeding issues on anticoagulation.   Today, he  denies symptoms of chest pain, shortness of breath, orthopnea, PND, lower extremity edema, dizziness, presyncope, syncope, snoring, daytime somnolence, bleeding, or neurologic sequela. The patient is tolerating medications without difficulties and is otherwise without complaint today.    Past Medical History:  Diagnosis  Date   Acute hearing loss of left ear 03/12/2021   Allergy    Allergic rhinitis   Angina pectoris 09/16/2004   Known coronary vasospasm -- treated with combination of long-acting nitrates and calcium  channel blockers; NTG when necessary   CAD (coronary artery disease) 04/16/2005   Mild obstructive ramus intermedius disease (30-50%) and 50-60% proximal   Chronic back pain    Stemming from prior lumbar diskectomy   GERD (gastroesophageal reflux disease)    Hyperlipidemia    Hypertension    Obesity    Substance abuse (HCC)    Known  chronic alcoholic - not interested in quitting (current as of 2014)    Current Outpatient Medications  Medication Sig Dispense Refill   acetaminophen  (TYLENOL ) 500 MG tablet Take 500 mg by mouth daily.     allopurinol  (ZYLOPRIM ) 100 MG tablet TAKE 1 TABLET(100 MG) BY MOUTH DAILY 90 tablet 2   amLODipine  (NORVASC ) 10 MG tablet TAKE 1 TABLET BY MOUTH EVERY DAY 90 tablet 3   apixaban  (ELIQUIS ) 5 MG TABS tablet Take 1 tablet (5 mg total) by mouth 2 (two) times daily. 180 tablet 3   atorvastatin  (LIPITOR) 80 MG tablet Take 1 tablet (80 mg total) by mouth daily. 90 tablet 3   busPIRone  (BUSPAR ) 30 MG tablet Take 1 tablet (30 mg total) by mouth 2 (two) times daily. 180 tablet 3   cetirizine  (ZYRTEC ) 10 MG tablet Take 10 mg by mouth daily.     cyclobenzaprine  (FLEXERIL ) 5 MG tablet Take 1 tablet (5 mg total) by mouth 3 (three) times daily as needed for muscle spasms. 45 tablet 1   dronedarone  (MULTAQ ) 400 MG tablet Take 1 tablet (400 mg total) by mouth 2 (two) times daily with a meal. 60 tablet 3   empagliflozin  (JARDIANCE ) 10 MG TABS tablet Take 1 tablet (10 mg total) by mouth daily. 90 tablet 3   ezetimibe  (ZETIA ) 10 MG tablet TAKE 1 TABLET(10 MG) BY MOUTH DAILY 90 tablet 1   HYDROcodone -acetaminophen  (NORCO/VICODIN) 5-325 MG tablet Take 1 tablet by mouth 2 (two) times daily as needed. (Patient taking differently: Take 1 tablet by mouth as needed.) 60 tablet 0   isosorbide  mononitrate (IMDUR ) 60 MG 24 hr tablet Take 1.5 tablets (90 mg total) by mouth daily. 135 tablet 3   losartan  (COZAAR ) 25 MG tablet Take 1 tablet (25 mg total) by mouth daily. 90 tablet 3   metoprolol  succinate (TOPROL -XL) 50 MG 24 hr tablet Take 2 tablets (100 mg total) by mouth daily. Take with or immediately following a meal. 180 tablet 0   Multiple Vitamin (MULTIVITAMIN WITH MINERALS) TABS tablet Take 1 tablet by mouth daily.     naloxone  (NARCAN ) nasal spray 4 mg/0.1 mL To be used only in case of suspected opioid overdose 1  each 0   nitroGLYCERIN  (NITROSTAT ) 0.4 MG SL tablet PLACE 1 TABLET UNDER THE TONGUE IF NEEDED FOR CHEST PAIN EVERY 5 MINUTES FOR 3 DOSES. IF NO RELIEF AFTER 3RD DOSE CALL MD OR 911 25 tablet 0   omeprazole  (PRILOSEC) 20 MG capsule TAKE 1 CAPSULE(20 MG) BY MOUTH DAILY 90 capsule 2   pregabalin  (LYRICA ) 200 MG capsule Take 1 capsule (200 mg total) by mouth at bedtime. 30 capsule 3   No current facility-administered medications for this encounter.    ROS- All systems are reviewed and negative except as per the HPI above  Physical Exam: Vitals:   09/24/24 0918  BP: 98/62  Pulse: (!) 104  Weight: 103.2  kg  Height: 5' 9 (1.753 m)    Wt Readings from Last 3 Encounters:  09/24/24 103.2 kg  08/27/24 101.6 kg  08/02/24 100.2 kg    GEN: Well nourished, well developed in no acute distress CARDIAC: Irregularly irregular rate and rhythm, no murmurs, rubs, gallops RESPIRATORY:  Clear to auscultation without rales, wheezing or rhonchi  ABDOMEN: Soft, non-tender, non-distended EXTREMITIES:  No edema; No deformity    EKG Interpretation Date/Time:  Friday September 24 2024 09:29:37 EST Ventricular Rate:  104 PR Interval:    QRS Duration:  104 QT Interval:  354 QTC Calculation: 465 R Axis:   61  Text Interpretation: Atrial flutter with variable A-V block Nonspecific ST and T wave abnormality Abnormal ECG When compared with ECG of 09-Mar-2024 08:20, Atrial flutter has replaced Sinus rhythm Confirmed by Bassem Bernasconi ([id number]) on 09/24/2024 10:49:37 AM    ECHO 01/03/23: IMPRESSIONS   1. Left ventricular ejection fraction by 3D volume is 62 %. The left  ventricle has no regional wall motion abnormalities.   2. Mild mitral valve regurgitation.   3. The inferior vena cava is normal in size with greater than 50%  respiratory variability, suggesting right atrial pressure of 3 mmHg.   Comparison(s): Prior images reviewed side by side. The left ventricular  function has improved.    CHA2DS2-VASc  Score = 4  The patient's score is based upon: CHF History: 1 HTN History: 1 Diabetes History: 0 Stroke History: 0 Vascular Disease History: 1 Age Score: 1 Gender Score: 0       ASSESSMENT AND PLAN: Persistent Atrial Fibrillation/atrial flutter (ICD10:  I48.19) The patient's CHA2DS2-VASc score is 4, indicating a 4.8% annual risk of stroke.   S/p afib and flutter ablation 12/08/23 He is back in persistent atrial flutter. We discussed rhythm control options today. Will plan to start Multaq  400 mg BID and arrange for DCCV.  Check cmet/cbc Will discuss with Dr Mealor if patient would be a candidate for repeat ablation.  Continue Eliquis  5 mg BID Continue Toprol  100 mg daily  Secondary Hypercoagulable State (ICD10:  D68.69) The patient is at significant risk for stroke/thromboembolism based upon his CHA2DS2-VASc Score of 4.  Continue Apixaban  (Eliquis ). No bleeding issues.   HTN Stable on current regimen  CAD No anginal symptoms Followed by Dr Santo   HFrecEF EF improved with SR and discontinuation of alcohol. GDMT per primary cardiology team Fluid status appears stable today   Follow up in the AF clinic post DCCV and then with Dr Santo as scheduled.    Informed Consent   Shared Decision Making/Informed Consent The risks (stroke, cardiac arrhythmias rarely resulting in the need for a temporary or permanent pacemaker, skin irritation or burns and complications associated with conscious sedation including aspiration, arrhythmia, respiratory failure and death), benefits (restoration of normal sinus rhythm) and alternatives of a direct current cardioversion were explained in detail to Corey Watts and he agrees to proceed.       Daril Kicks PA-C Afib Clinic [facility] [address] [place], [place] [address] [phone]  "

## 2024-09-24 NOTE — Patient Instructions (Signed)
 Start Multaq  400mg  twice a day WITH FOOD     Hold below medications 72 hours prior to scheduled procedure/anesthesia. Restart medication on the following day after scheduled procedure/anesthesia  Empagliflozin  (Jardiance )-- Hold as of January 1/11 until after procedure   Cardioversion scheduled for: Thursday January 15th   - Arrive at the [facility] ([address]  and check in with ADMITTING at 8:30 AM   - Do not eat or drink anything after midnight the night prior to your procedure.   - Take all your morning medication (except diabetic medications) with a sip of water prior to arrival.  - Do NOT miss any doses of your blood thinner - if you should miss a dose or take a dose more than 4 hours late -- please notify our office immediately.  - You will not be able to drive home after your procedure. Please ensure you have a responsible adult to drive you home. You will need someone with you for 24 hours post procedure.     - Expect to be in the procedural area approximately 2 hours.   - If you feel as if you go back into normal rhythm prior to scheduled cardioversion, please notify our office immediately.   If your procedure is canceled in the cardioversion suite you will be charged a cancellation fee.

## 2024-09-24 NOTE — Telephone Encounter (Signed)
 Pharmacy Patient Advocate Encounter  Insurance verification completed.    The patient is insured through [facility]. Patient has Medicare and is not eligible for a copay card, but may be able to apply for patient assistance or Medicare RX Payment Plan (Patient Must reach out to their plan, if eligible for payment plan), if available.    Ran test claim for Multaq  400mg  tablet and the current 30 day co-pay is $12.65.   This test claim was processed through [facility]- copay amounts may vary at other pharmacies due to pharmacy/plan contracts, or as the patient moves through the different stages of their insurance plan.

## 2024-09-24 NOTE — H&P (View-Only) (Signed)
 "  Primary Care Physician: Corey Palma, MD Referring Physician: Dr. Emilee Watts Corey [name] is a 69 y.o. male with a h/o moderate non obstructive CAD in 2006 complicated by coronary vasospasm, alcohol use, new onset  atrial fibrillation, and obesity, as well as new heart failure with reduced EF.  He was seen by Dr. Emilee 08/16/22 with c/o of exertional dyspnea and was in afib with RVR. He had a cardioversion 12/15 which was successful. He is now in the afib clinic for f/u . EKG today shows SR. He states that he feels improved but still at times feels short of breath. EF by echo performed while  in afib with RVR was 30-35%. He has given up alcohol and lost 17 lbs over the last 2 weeks. Since giving up alcohol and weight loss, his wife states that he is no longer snoring or having apnea spells. He is complaint with anticoagulation. Minimal caffeine and no tobacco.   On follow up 01/22/23, he is in what appears to be atrial flutter with RVR 171 bpm. He admits to being in this abnormal rhythm since Monday. He is presyncopal and almost passed out earlier today. He can feel his heart beating rapidly and is short of breath with walking. Denies chest pain. He has not missed any doses of Eliquis  5 mg BID. The last time he has eaten today was around noon.   He notes he has not had any alcohol since December.   On follow up 01/30/23, he is in NSR. He is s/p ED evaluation after our office visit on 01/22/23. He underwent emergent cardioversion in the ED and was successfully converted to NSR. He notes he was very short of breath when out of rhythm and this has improved. He is still short of breath but it is better. He has not missed any doses of anticoagulation.  Follow up in the AF clinic 07/16/23. Patient reports that he went into afib on Monday and has been feeling much more fatigued and SOB. There were no specific triggers that he could identify. He had to cancel his consultation with EP to  discuss ablation due to an insurance change.  Follow up in Afib clinic, 07/29/23. He is currently in NSR. S/p successful DCCV on 07/21/23. He feels better in normal rhythm. No missed doses of Eliquis . He would still like to discuss ablation with EP.   Follow up 01/05/24. Patient returns for follow up for atrial fibrillation. He is s/p afib and flutter ablation with Dr Corey Watts on 12/08/23. He reports that he had done very well post ablation until this past Thursday he went out of rhythm with symptoms of fatigue and SOB on exertion. There were no specific triggers that he could identify. He denies any groin issues post ablation.    Follow up 09/24/24. Patient returns for follow up for atrial fibrillation and atrial flutter. He has symptoms of fatigue and palpitations. He reports that he went back into atrial flutter about one week ago. He did have an URI at the time but there were no other specific triggers. Prior to this episode, he has had several shorter episodes of afib/flutter lasting 1-2 days. No bleeding issues on anticoagulation.   Today, he  denies symptoms of chest pain, shortness of breath, orthopnea, PND, lower extremity edema, dizziness, presyncope, syncope, snoring, daytime somnolence, bleeding, or neurologic sequela. The patient is tolerating medications without difficulties and is otherwise without complaint today.    Past Medical History:  Diagnosis  Date   Acute hearing loss of left ear 03/12/2021   Allergy    Allergic rhinitis   Angina pectoris 09/16/2004   Known coronary vasospasm -- treated with combination of long-acting nitrates and calcium  channel blockers; NTG when necessary   CAD (coronary artery disease) 04/16/2005   Mild obstructive ramus intermedius disease (30-50%) and 50-60% proximal   Chronic back pain    Stemming from prior lumbar diskectomy   GERD (gastroesophageal reflux disease)    Hyperlipidemia    Hypertension    Obesity    Substance abuse (HCC)    Known  chronic alcoholic - not interested in quitting (current as of 2014)    Current Outpatient Medications  Medication Sig Dispense Refill   acetaminophen  (TYLENOL ) 500 MG tablet Take 500 mg by mouth daily.     allopurinol  (ZYLOPRIM ) 100 MG tablet TAKE 1 TABLET(100 MG) BY MOUTH DAILY 90 tablet 2   amLODipine  (NORVASC ) 10 MG tablet TAKE 1 TABLET BY MOUTH EVERY DAY 90 tablet 3   apixaban  (ELIQUIS ) 5 MG TABS tablet Take 1 tablet (5 mg total) by mouth 2 (two) times daily. 180 tablet 3   atorvastatin  (LIPITOR) 80 MG tablet Take 1 tablet (80 mg total) by mouth daily. 90 tablet 3   busPIRone  (BUSPAR ) 30 MG tablet Take 1 tablet (30 mg total) by mouth 2 (two) times daily. 180 tablet 3   cetirizine  (ZYRTEC ) 10 MG tablet Take 10 mg by mouth daily.     cyclobenzaprine  (FLEXERIL ) 5 MG tablet Take 1 tablet (5 mg total) by mouth 3 (three) times daily as needed for muscle spasms. 45 tablet 1   dronedarone  (MULTAQ ) 400 MG tablet Take 1 tablet (400 mg total) by mouth 2 (two) times daily with a meal. 60 tablet 3   empagliflozin  (JARDIANCE ) 10 MG TABS tablet Take 1 tablet (10 mg total) by mouth daily. 90 tablet 3   ezetimibe  (ZETIA ) 10 MG tablet TAKE 1 TABLET(10 MG) BY MOUTH DAILY 90 tablet 1   HYDROcodone -acetaminophen  (NORCO/VICODIN) 5-325 MG tablet Take 1 tablet by mouth 2 (two) times daily as needed. (Patient taking differently: Take 1 tablet by mouth as needed.) 60 tablet 0   isosorbide  mononitrate (IMDUR ) 60 MG 24 hr tablet Take 1.5 tablets (90 mg total) by mouth daily. 135 tablet 3   losartan  (COZAAR ) 25 MG tablet Take 1 tablet (25 mg total) by mouth daily. 90 tablet 3   metoprolol  succinate (TOPROL -XL) 50 MG 24 hr tablet Take 2 tablets (100 mg total) by mouth daily. Take with or immediately following a meal. 180 tablet 0   Multiple Vitamin (MULTIVITAMIN WITH MINERALS) TABS tablet Take 1 tablet by mouth daily.     naloxone  (NARCAN ) nasal spray 4 mg/0.1 mL To be used only in case of suspected opioid overdose 1  each 0   nitroGLYCERIN  (NITROSTAT ) 0.4 MG SL tablet PLACE 1 TABLET UNDER THE TONGUE IF NEEDED FOR CHEST PAIN EVERY 5 MINUTES FOR 3 DOSES. IF NO RELIEF AFTER 3RD DOSE CALL MD OR 911 25 tablet 0   omeprazole  (PRILOSEC) 20 MG capsule TAKE 1 CAPSULE(20 MG) BY MOUTH DAILY 90 capsule 2   pregabalin  (LYRICA ) 200 MG capsule Take 1 capsule (200 mg total) by mouth at bedtime. 30 capsule 3   No current facility-administered medications for this encounter.    ROS- All systems are reviewed and negative except as per the HPI above  Physical Exam: Vitals:   09/24/24 0918  BP: 98/62  Pulse: (!) 104  Weight: 103.2  kg  Height: 5' 9 (1.753 m)    Wt Readings from Last 3 Encounters:  09/24/24 103.2 kg  08/27/24 101.6 kg  08/02/24 100.2 kg    GEN: Well nourished, well developed in no acute distress CARDIAC: Irregularly irregular rate and rhythm, no murmurs, rubs, gallops RESPIRATORY:  Clear to auscultation without rales, wheezing or rhonchi  ABDOMEN: Soft, non-tender, non-distended EXTREMITIES:  No edema; No deformity    EKG Interpretation Date/Time:  Friday September 24 2024 09:29:37 EST Ventricular Rate:  104 PR Interval:    QRS Duration:  104 QT Interval:  354 QTC Calculation: 465 R Axis:   61  Text Interpretation: Atrial flutter with variable A-V block Nonspecific ST and T wave abnormality Abnormal ECG When compared with ECG of 09-Mar-2024 08:20, Atrial flutter has replaced Sinus rhythm Confirmed by Bassem Bernasconi ([id number]) on 09/24/2024 10:49:37 AM    ECHO 01/03/23: IMPRESSIONS   1. Left ventricular ejection fraction by 3D volume is 62 %. The left  ventricle has no regional wall motion abnormalities.   2. Mild mitral valve regurgitation.   3. The inferior vena cava is normal in size with greater than 50%  respiratory variability, suggesting right atrial pressure of 3 mmHg.   Comparison(s): Prior images reviewed side by side. The left ventricular  function has improved.    CHA2DS2-VASc  Score = 4  The patient's score is based upon: CHF History: 1 HTN History: 1 Diabetes History: 0 Stroke History: 0 Vascular Disease History: 1 Age Score: 1 Gender Score: 0       ASSESSMENT AND PLAN: Persistent Atrial Fibrillation/atrial flutter (ICD10:  I48.19) The patient's CHA2DS2-VASc score is 4, indicating a 4.8% annual risk of stroke.   S/p afib and flutter ablation 12/08/23 He is back in persistent atrial flutter. We discussed rhythm control options today. Will plan to start Multaq  400 mg BID and arrange for DCCV.  Check cmet/cbc Will discuss with Dr Mealor if patient would be a candidate for repeat ablation.  Continue Eliquis  5 mg BID Continue Toprol  100 mg daily  Secondary Hypercoagulable State (ICD10:  D68.69) The patient is at significant risk for stroke/thromboembolism based upon his CHA2DS2-VASc Score of 4.  Continue Apixaban  (Eliquis ). No bleeding issues.   HTN Stable on current regimen  CAD No anginal symptoms Followed by Dr Santo   HFrecEF EF improved with SR and discontinuation of alcohol. GDMT per primary cardiology team Fluid status appears stable today   Follow up in the AF clinic post DCCV and then with Dr Santo as scheduled.    Informed Consent   Shared Decision Making/Informed Consent The risks (stroke, cardiac arrhythmias rarely resulting in the need for a temporary or permanent pacemaker, skin irritation or burns and complications associated with conscious sedation including aspiration, arrhythmia, respiratory failure and death), benefits (restoration of normal sinus rhythm) and alternatives of a direct current cardioversion were explained in detail to Mr. Majerus and he agrees to proceed.       Corey Kicks PA-C Af[facility] [address] [place], [place] [address] [phone]  "

## 2024-09-25 LAB — COMPREHENSIVE METABOLIC PANEL WITH GFR
ALT: 16 IU/L (ref 0–44)
AST: 18 IU/L (ref 0–40)
Albumin: 4.3 g/dL (ref 3.9–4.9)
Alkaline Phosphatase: 78 IU/L (ref 47–123)
BUN/Creatinine Ratio: 8 — ABNORMAL LOW (ref 10–24)
BUN: 10 mg/dL (ref 8–27)
Bilirubin Total: 0.6 mg/dL (ref 0.0–1.2)
CO2: 23 mmol/L (ref 20–29)
Calcium: 9.2 mg/dL (ref 8.6–10.2)
Chloride: 103 mmol/L (ref 96–106)
Creatinine, Ser: 1.22 mg/dL (ref 0.76–1.27)
Globulin, Total: 2.5 g/dL (ref 1.5–4.5)
Glucose: 162 mg/dL — ABNORMAL HIGH (ref 70–99)
Potassium: 4.5 mmol/L (ref 3.5–5.2)
Sodium: 141 mmol/L (ref 134–144)
Total Protein: 6.8 g/dL (ref 6.0–8.5)
eGFR: 65 mL/min/1.73

## 2024-09-25 LAB — CBC
Hematocrit: 44.6 % (ref 37.5–51.0)
Hemoglobin: 14.4 g/dL (ref 13.0–17.7)
MCH: 30.7 pg (ref 26.6–33.0)
MCHC: 32.3 g/dL (ref 31.5–35.7)
MCV: 95 fL (ref 79–97)
Platelets: 224 x10E3/uL (ref 150–450)
RBC: 4.69 x10E6/uL (ref 4.14–5.80)
RDW: 15.7 % — ABNORMAL HIGH (ref 11.6–15.4)
WBC: 6.3 x10E3/uL (ref 3.4–10.8)

## 2024-09-27 ENCOUNTER — Ambulatory Visit (HOSPITAL_COMMUNITY): Payer: Self-pay | Admitting: Physician Assistant

## 2024-09-30 ENCOUNTER — Encounter (HOSPITAL_COMMUNITY): Admission: RE | Disposition: A | Payer: Self-pay | Attending: Cardiology

## 2024-09-30 ENCOUNTER — Encounter (HOSPITAL_COMMUNITY): Payer: Self-pay | Admitting: Cardiology

## 2024-09-30 ENCOUNTER — Other Ambulatory Visit: Payer: Self-pay

## 2024-09-30 ENCOUNTER — Ambulatory Visit (HOSPITAL_COMMUNITY): Admitting: Certified Registered Nurse Anesthetist

## 2024-09-30 ENCOUNTER — Ambulatory Visit (HOSPITAL_COMMUNITY)
Admission: RE | Admit: 2024-09-30 | Discharge: 2024-09-30 | Disposition: A | Discharge status: Home / Self Care | Attending: Cardiology | Admitting: Cardiology

## 2024-09-30 DIAGNOSIS — D6869 Other thrombophilia: Secondary | ICD-10-CM | POA: Diagnosis not present

## 2024-09-30 DIAGNOSIS — I110 Hypertensive heart disease with heart failure: Secondary | ICD-10-CM | POA: Diagnosis not present

## 2024-09-30 DIAGNOSIS — I10 Essential (primary) hypertension: Secondary | ICD-10-CM

## 2024-09-30 DIAGNOSIS — I5032 Chronic diastolic (congestive) heart failure: Secondary | ICD-10-CM | POA: Insufficient documentation

## 2024-09-30 DIAGNOSIS — I4891 Unspecified atrial fibrillation: Secondary | ICD-10-CM

## 2024-09-30 DIAGNOSIS — I4819 Other persistent atrial fibrillation: Secondary | ICD-10-CM | POA: Diagnosis present

## 2024-09-30 DIAGNOSIS — I4892 Unspecified atrial flutter: Secondary | ICD-10-CM | POA: Diagnosis not present

## 2024-09-30 DIAGNOSIS — Z79899 Other long term (current) drug therapy: Secondary | ICD-10-CM | POA: Insufficient documentation

## 2024-09-30 DIAGNOSIS — Z87891 Personal history of nicotine dependence: Secondary | ICD-10-CM | POA: Diagnosis not present

## 2024-09-30 DIAGNOSIS — I2510 Atherosclerotic heart disease of native coronary artery without angina pectoris: Secondary | ICD-10-CM | POA: Insufficient documentation

## 2024-09-30 DIAGNOSIS — Z7901 Long term (current) use of anticoagulants: Secondary | ICD-10-CM | POA: Insufficient documentation

## 2024-09-30 HISTORY — PX: CARDIOVERSION: EP1203

## 2024-09-30 MED ORDER — SODIUM CHLORIDE 0.9% FLUSH
INTRAVENOUS | Status: DC | PRN
  Administered 2024-09-30: 3 mL via INTRAVENOUS

## 2024-09-30 MED ORDER — SODIUM CHLORIDE 0.9 % IV SOLN: INTRAVENOUS | Status: DC

## 2024-09-30 MED ORDER — PROPOFOL 10 MG/ML IV BOLUS
INTRAVENOUS | Status: DC | PRN
  Administered 2024-09-30: 60 mg via INTRAVENOUS

## 2024-09-30 MED ORDER — LIDOCAINE 2% (20 MG/ML) 5 ML SYRINGE
INTRAMUSCULAR | Status: DC | PRN
  Administered 2024-09-30: 100 mg via INTRAVENOUS

## 2024-09-30 NOTE — Anesthesia Postprocedure Evaluation (Signed)
"   Anesthesia Post Note  Patient: Corey BRANDOW Sr.  Procedure(s) Performed: CARDIOVERSION     Patient location during evaluation: Cath Lab Anesthesia Type: General Level of consciousness: awake and alert Pain management: pain level controlled Vital Signs Assessment: post-procedure vital signs reviewed and stable Respiratory status: spontaneous breathing, nonlabored ventilation, respiratory function stable and patient connected to nasal cannula oxygen Cardiovascular status: blood pressure returned to baseline and stable Postop Assessment: no apparent nausea or vomiting Anesthetic complications: no   No notable events documented.  Last Vitals:  Vitals:   09/30/24 0915 09/30/24 0936  BP: 95/70 106/61  Pulse: 60 (!) 56  Resp: 18 19  Temp:    SpO2: 95% 91%    Last Pain:  Vitals:   09/30/24 0936  TempSrc:   PainSc: (P) 0-No pain                 Rome Ade      "

## 2024-09-30 NOTE — Anesthesia Preprocedure Evaluation (Signed)
 "                                  Anesthesia Evaluation  Patient identified by MRN, date of birth, ID band Patient awake    Reviewed: Allergy & Precautions, NPO status , Patient's Chart, lab work & pertinent test results, reviewed documented beta blocker date and time   History of Anesthesia Complications Negative for: history of anesthetic complications  Airway Mallampati: III  TM Distance: >3 FB Neck ROM: Full    Dental  (+) Upper Dentures, Lower Dentures Firmly bonded in place, patient wishes for them to remain in:   Pulmonary neg pulmonary ROS, neg sleep apnea, neg COPD, Patient abstained from smoking.Not current smoker, former smoker   Pulmonary exam normal breath sounds clear to auscultation       Cardiovascular Exercise Tolerance: Good METShypertension, Pt. on medications and Pt. on home beta blockers + CAD  (-) Past MI Normal cardiovascular exam+ dysrhythmias (on Eliquis ) Atrial Fibrillation  Rhythm:Irregular Rate:Normal - Systolic murmurs    Neuro/Psych negative neurological ROS  negative psych ROS   GI/Hepatic Neg liver ROS,GERD  Medicated,,  Endo/Other  negative endocrine ROSneg diabetes    Renal/GU negative Renal ROS     Musculoskeletal negative musculoskeletal ROS (+)    Abdominal  (+) + obese  Peds  Hematology negative hematology ROS (+)   Anesthesia Other Findings Past Medical History: 03/12/2021: Acute hearing loss of left ear No date: Allergy     Comment:  Allergic rhinitis 09/16/2004: Angina pectoris     Comment:  Known coronary vasospasm -- treated with combination of               long-acting nitrates and calcium  channel blockers; NTG               when necessary 04/16/2005: CAD (coronary artery disease)     Comment:  Mild obstructive ramus intermedius disease (30-50%) and               50-60% proximal No date: Chronic back pain     Comment:  Stemming from prior lumbar diskectomy No date: GERD (gastroesophageal reflux  disease) No date: Hyperlipidemia No date: Hypertension No date: Obesity No date: Substance abuse (HCC)     Comment:  Known chronic alcoholic - not interested in quitting               (current as of 2014)  Reproductive/Obstetrics                              Anesthesia Physical Anesthesia Plan  ASA: 3  Anesthesia Plan: General   Post-op Pain Management: Minimal or no pain anticipated   Induction: Intravenous  PONV Risk Score and Plan: 2 and Treatment may vary due to age or medical condition, Ondansetron , Propofol  infusion and TIVA  Airway Management Planned: Natural Airway  Additional Equipment: None  Intra-op Plan:   Post-operative Plan:   Informed Consent: I have reviewed the patients History and Physical, chart, labs and discussed the procedure including the risks, benefits and alternatives for the proposed anesthesia with the patient or authorized representative who has indicated his/her understanding and acceptance.     Dental advisory given  Plan Discussed with: CRNA  Anesthesia Plan Comments: (Discussed risks of anesthesia with patient, including possibility of difficulty with spontaneous ventilation under anesthesia necessitating airway intervention, PONV, and  rare risks such as cardiac or respiratory or neurological events, and allergic reactions. Discussed the role of CRNA in patient's perioperative care. Patient understands.)        Anesthesia Quick Evaluation  "

## 2024-09-30 NOTE — Transfer of Care (Signed)
 Immediate Anesthesia Transfer of Care Note  Patient: Corey AKHTAR Sr.  Procedure(s) Performed: CARDIOVERSION  Patient Location: Cath Lab  Anesthesia Type:General  Level of Consciousness: drowsy  Airway & Oxygen Therapy: Patient Spontanous Breathing and Patient connected to face mask oxygen  Post-op Assessment: Report given to RN, Post -op Vital signs reviewed and stable, and Patient moving all extremities X 4  Post vital signs: Reviewed and stable  Last Vitals:  Vitals Value Taken Time  BP 90/65 09/30/24 09:03  Temp    Pulse 68 09/30/24 09:04  Resp 21 09/30/24 09:04  SpO2 93 % 09/30/24 09:04    Last Pain:  Vitals:   09/30/24 0855  TempSrc:   PainSc: 0-No pain         Complications: No notable events documented.

## 2024-09-30 NOTE — Discharge Instructions (Signed)
 Corey Watts

## 2024-09-30 NOTE — Interval H&P Note (Signed)
 History and Physical Interval Note:  09/30/2024 8:52 AM  Corey DELENA Sharps Sr.  has presented today for surgery, with the diagnosis of AFIB.  The various methods of treatment have been discussed with the patient and family. After consideration of risks, benefits and other options for treatment, the patient has consented to  Procedures: CARDIOVERSION (N/A) as a surgical intervention.  The patient's history has been reviewed, patient examined, no change in status, stable for surgery.  I have reviewed the patient's chart and labs.  Questions were answered to the patient's satisfaction.     [name]

## 2024-09-30 NOTE — CV Procedure (Signed)
 Procedure:   DCCV  Indication:  Symptomatic atrial fibrillation/atypical flutter  Procedure Note:  The patient signed informed consent.  They have had had therapeutic anticoagulation with apixaban  greater than 3 weeks.  Anesthesia was administered by Dr. Boone.  Patient received 100 mg IV lidocaine  and 60 mg IV propofol .Adequate airway was maintained throughout and vital followed per protocol.  They were cardioverted x 1 with 200J of biphasic synchronized energy and anterior pressure.  They converted to NSR.  There were no apparent complications.  The patient had normal neuro status and respiratory status post procedure with vitals stable as recorded elsewhere.    Follow up:  They will continue on current medical therapy and follow up with cardiology as scheduled.  Shelda Bruckner, MD PhD 09/30/2024 9:05 AM

## 2024-10-08 ENCOUNTER — Ambulatory Visit (HOSPITAL_COMMUNITY)
Admission: RE | Admit: 2024-10-08 | Discharge: 2024-10-08 | Disposition: A | Source: Ambulatory Visit | Attending: Physician Assistant | Admitting: Physician Assistant

## 2024-10-08 VITALS — BP 106/60 | HR 52 | Ht 69.0 in | Wt 227.7 lb

## 2024-10-08 DIAGNOSIS — Z5181 Encounter for therapeutic drug level monitoring: Secondary | ICD-10-CM | POA: Diagnosis not present

## 2024-10-08 DIAGNOSIS — I484 Atypical atrial flutter: Secondary | ICD-10-CM | POA: Diagnosis not present

## 2024-10-08 DIAGNOSIS — I4891 Unspecified atrial fibrillation: Secondary | ICD-10-CM | POA: Diagnosis not present

## 2024-10-08 DIAGNOSIS — I4819 Other persistent atrial fibrillation: Secondary | ICD-10-CM | POA: Diagnosis not present

## 2024-10-08 DIAGNOSIS — D6869 Other thrombophilia: Secondary | ICD-10-CM

## 2024-10-08 DIAGNOSIS — Z79899 Other long term (current) drug therapy: Secondary | ICD-10-CM | POA: Diagnosis not present

## 2024-10-08 MED ORDER — DRONEDARONE HCL 400 MG PO TABS: 400.0000 mg | ORAL_TABLET | Freq: Two times a day (BID) | ORAL | 2 refills | Status: AC

## 2024-10-08 NOTE — Progress Notes (Signed)
 "  Primary Care Physician: Corey Palma, MD Referring Physician: Dr. Emilee Watts Corey Watts Sr. is a 69 y.o. male with a h/o moderate non obstructive CAD in 2006 complicated by coronary vasospasm, alcohol use, new onset  atrial fibrillation, and obesity, as well as new heart failure with reduced EF.  He was seen by Dr. Emilee 08/16/22 with c/o of exertional dyspnea and was in afib with RVR. He had a cardioversion 12/15 which was successful. He is now in the afib clinic for f/u . EKG today shows SR. He states that he feels improved but still at times feels short of breath. EF by echo performed while  in afib with RVR was 30-35%. He has given up alcohol and lost 17 lbs over the last 2 weeks. Since giving up alcohol and weight loss, his wife states that he is no longer snoring or having apnea spells. He is complaint with anticoagulation. Minimal caffeine and no tobacco.   On follow up 01/22/23, he is in what appears to be atrial flutter with RVR 171 bpm. He admits to being in this abnormal rhythm since Monday. He is presyncopal and almost passed out earlier today. He can feel his heart beating rapidly and is short of breath with walking. Denies chest pain. He has not missed any doses of Eliquis  5 mg BID. The last time he has eaten today was around noon.   He notes he has not had any alcohol since December.   On follow up 01/30/23, he is in NSR. He is s/p ED evaluation after our office visit on 01/22/23. He underwent emergent cardioversion in the ED and was successfully converted to NSR. He notes he was very short of breath when out of rhythm and this has improved. He is still short of breath but it is better. He has not missed any doses of anticoagulation.  Follow up in the AF clinic 07/16/23. Patient reports that he went into afib on Monday and has been feeling much more fatigued and SOB. There were no specific triggers that he could identify. He had to cancel his consultation with EP to  discuss ablation due to an insurance change.  Follow up in Afib clinic, 07/29/23. He is currently in NSR. S/p successful DCCV on 07/21/23. He feels better in normal rhythm. No missed doses of Eliquis . He would still like to discuss ablation with EP.   Follow up 01/05/24. Patient returns for follow up for atrial fibrillation. He is s/p afib and flutter ablation with Dr Corey Watts on 12/08/23. He reports that he had done very well post ablation until this past Thursday he went out of rhythm with symptoms of fatigue and SOB on exertion. There were no specific triggers that he could identify. He denies any groin issues post ablation.    Follow up 09/24/24. Patient returns for follow up for atrial fibrillation and atrial flutter. He has symptoms of fatigue and palpitations. He reports that he went back into atrial flutter about one week ago. He did have an URI at the time but there were no other specific triggers. Prior to this episode, he has had several shorter episodes of afib/flutter lasting 1-2 days. No bleeding issues on anticoagulation.   Follow up 10/08/24. Patient returns for follow up for atrial fibrillation. Patient is s/p DCCV on 09/30/24. He is in SR today and feels well. He is tolerating Multaq  without issue. No bleeding issues on anticoagulation.   Today, he  denies symptoms of palpitations, chest pain,  shortness of breath, orthopnea, PND, lower extremity edema, dizziness, presyncope, syncope, snoring, daytime somnolence, bleeding, or neurologic sequela. The patient is tolerating medications without difficulties and is otherwise without complaint today.    Past Medical History:  Diagnosis Date   Acute hearing loss of left ear 03/12/2021   Allergy    Allergic rhinitis   Angina pectoris 09/16/2004   Known coronary vasospasm -- treated with combination of long-acting nitrates and calcium  channel blockers; NTG when necessary   CAD (coronary artery disease) 04/16/2005   Mild obstructive ramus  intermedius disease (30-50%) and 50-60% proximal   Chronic back pain    Stemming from prior lumbar diskectomy   GERD (gastroesophageal reflux disease)    Hyperlipidemia    Hypertension    Obesity    Substance abuse (HCC)    Known chronic alcoholic - not interested in quitting (current as of 2014)    Current Outpatient Medications  Medication Sig Dispense Refill   acetaminophen  (TYLENOL ) 500 MG tablet Take 500 mg by mouth daily.     allopurinol  (ZYLOPRIM ) 100 MG tablet TAKE 1 TABLET(100 MG) BY MOUTH DAILY 90 tablet 2   amLODipine  (NORVASC ) 10 MG tablet TAKE 1 TABLET BY MOUTH EVERY DAY 90 tablet 3   apixaban  (ELIQUIS ) 5 MG TABS tablet Take 1 tablet (5 mg total) by mouth 2 (two) times daily. 180 tablet 3   atorvastatin  (LIPITOR) 80 MG tablet Take 1 tablet (80 mg total) by mouth daily. 90 tablet 3   busPIRone  (BUSPAR ) 30 MG tablet Take 1 tablet (30 mg total) by mouth 2 (two) times daily. 180 tablet 3   cetirizine  (ZYRTEC ) 10 MG tablet Take 10 mg by mouth daily.     cyclobenzaprine  (FLEXERIL ) 5 MG tablet Take 1 tablet (5 mg total) by mouth 3 (three) times daily as needed for muscle spasms. 45 tablet 1   empagliflozin  (JARDIANCE ) 10 MG TABS tablet Take 1 tablet (10 mg total) by mouth daily. 90 tablet 3   ezetimibe  (ZETIA ) 10 MG tablet TAKE 1 TABLET(10 MG) BY MOUTH DAILY 90 tablet 1   HYDROcodone -acetaminophen  (NORCO/VICODIN) 5-325 MG tablet Take 1 tablet by mouth 2 (two) times daily as needed. (Patient taking differently: Take 1 tablet by mouth as needed.) 60 tablet 0   isosorbide  mononitrate (IMDUR ) 60 MG 24 hr tablet Take 1.5 tablets (90 mg total) by mouth daily. (Patient taking differently: Take 60 mg by mouth daily.) 135 tablet 3   losartan  (COZAAR ) 25 MG tablet Take 1 tablet (25 mg total) by mouth daily. 90 tablet 3   metoprolol  succinate (TOPROL -XL) 50 MG 24 hr tablet Take 2 tablets (100 mg total) by mouth daily. Take with or immediately following a meal. 180 tablet 0   Multiple Vitamin  (MULTIVITAMIN WITH MINERALS) TABS tablet Take 1 tablet by mouth daily.     naloxone  (NARCAN ) nasal spray 4 mg/0.1 mL To be used only in case of suspected opioid overdose 1 each 0   nitroGLYCERIN  (NITROSTAT ) 0.4 MG SL tablet PLACE 1 TABLET UNDER THE TONGUE IF NEEDED FOR CHEST PAIN EVERY 5 MINUTES FOR 3 DOSES. IF NO RELIEF AFTER 3RD DOSE CALL MD OR 911 25 tablet 0   omeprazole  (PRILOSEC) 20 MG capsule TAKE 1 CAPSULE(20 MG) BY MOUTH DAILY 90 capsule 2   pregabalin  (LYRICA ) 200 MG capsule Take 1 capsule (200 mg total) by mouth at bedtime. 30 capsule 3   dronedarone  (MULTAQ ) 400 MG tablet Take 1 tablet (400 mg total) by mouth 2 (two) times daily with  a meal. 180 tablet 2   No current facility-administered medications for this encounter.    ROS- All systems are reviewed and negative except as per the HPI above  Physical Exam: Vitals:   10/08/24 0905  BP: 106/60  Pulse: (!) 52  Weight: 103.3 kg  Height: 5' 9 (1.753 m)     Wt Readings from Last 3 Encounters:  10/08/24 103.3 kg  09/30/24 103 kg  09/24/24 103.2 kg    GEN: Well nourished, well developed in no acute distress CARDIAC: Regular rate and rhythm, no murmurs, rubs, gallops RESPIRATORY:  Clear to auscultation without rales, wheezing or rhonchi  ABDOMEN: Soft, non-tender, non-distended EXTREMITIES:  No edema; No deformity    EKG Interpretation Date/Time:  Friday October 08 2024 09:23:42 EST Ventricular Rate:  52 PR Interval:  130 QRS Duration:  106 QT Interval:  468 QTC Calculation: 435 R Axis:   52  Text Interpretation: Sinus bradycardia Nonspecific ST and T wave abnormality Abnormal ECG When compared with ECG of 30-Sep-2024 09:13, No significant change was found Confirmed by Danayah Smyre ([id number]) on 10/08/2024 9:44:58 AM    ECHO 01/03/23: IMPRESSIONS   1. Left ventricular ejection fraction by 3D volume is 62 %. The left  ventricle has no regional wall motion abnormalities.   2. Mild mitral valve regurgitation.   3. The  inferior vena cava is normal in size with greater than 50%  respiratory variability, suggesting right atrial pressure of 3 mmHg.   Comparison(s): Prior images reviewed side by side. The left ventricular  function has improved.    CHA2DS2-VASc Score = 4  The patient's score is based upon: CHF History: 1 HTN History: 1 Diabetes History: 0 Stroke History: 0 Vascular Disease History: 1 Age Score: 1 Gender Score: 0       ASSESSMENT AND PLAN: Persistent Atrial Fibrillation/atrial flutter (ICD10:  I48.19) The patient's CHA2DS2-VASc score is 4, indicating a 4.8% annual risk of stroke.   S/p afib and flutter ablation 12/08/23 S/p DCCV 09/30/24 Patient appears to be maintaining SR Continue Multaq  400 mg BID Continue Eliquis  5 mg BID Continue Toprol  100 mg daily  Secondary Hypercoagulable State (ICD10:  D68.69) The patient is at significant risk for stroke/thromboembolism based upon his CHA2DS2-VASc Score of 4.  Continue Apixaban  (Eliquis ). No bleeding issues.   High Risk Medication Monitoring (ICD 10: [id number]) Patient requires ongoing monitoring for anti-arrhythmic medication which has the potential to cause life threatening arrhythmias. Intervals on ECG acceptable for dronedarone  monitoring.      HTN Stable on current regimen  CAD No anginal symptoms Followed by Dr Santo   HFrecEF EF normalized with SR and alcohol cessation.  GDMT per primary cardiology team Fluid status appears stable today   Follow up with Dr Santo as scheduled. AF clinic in 6 months.     Daril Kicks PA-C [facility] [address] [place], [place] [address] [phone]  "

## 2024-10-14 ENCOUNTER — Other Ambulatory Visit: Payer: Self-pay

## 2024-10-14 DIAGNOSIS — M545 Low back pain, unspecified: Secondary | ICD-10-CM

## 2024-10-26 ENCOUNTER — Ambulatory Visit: Payer: Self-pay | Admitting: Family Medicine

## 2024-11-05 ENCOUNTER — Ambulatory Visit: Admitting: Internal Medicine
